# Patient Record
Sex: Female | Born: 1935 | Race: White | Hispanic: No | Smoking: Former smoker
Health system: Southern US, Community
[De-identification: ages and names within clinical notes are randomized; demographics above are authoritative.]

## PROBLEM LIST (undated history)

## (undated) DIAGNOSIS — F329 Major depressive disorder, single episode, unspecified: Secondary | ICD-10-CM

## (undated) DIAGNOSIS — F32A Depression, unspecified: Secondary | ICD-10-CM

## (undated) DIAGNOSIS — Z950 Presence of cardiac pacemaker: Secondary | ICD-10-CM

## (undated) DIAGNOSIS — E785 Hyperlipidemia, unspecified: Secondary | ICD-10-CM

## (undated) DIAGNOSIS — F419 Anxiety disorder, unspecified: Secondary | ICD-10-CM

## (undated) DIAGNOSIS — I4819 Other persistent atrial fibrillation: Secondary | ICD-10-CM

## (undated) DIAGNOSIS — R0789 Other chest pain: Secondary | ICD-10-CM

## (undated) DIAGNOSIS — I10 Essential (primary) hypertension: Secondary | ICD-10-CM

## (undated) DIAGNOSIS — I495 Sick sinus syndrome: Secondary | ICD-10-CM

## (undated) DIAGNOSIS — H3530 Unspecified macular degeneration: Secondary | ICD-10-CM

## (undated) DIAGNOSIS — M1990 Unspecified osteoarthritis, unspecified site: Secondary | ICD-10-CM

## (undated) DIAGNOSIS — I2510 Atherosclerotic heart disease of native coronary artery without angina pectoris: Secondary | ICD-10-CM

## (undated) DIAGNOSIS — H409 Unspecified glaucoma: Secondary | ICD-10-CM

## (undated) DIAGNOSIS — K219 Gastro-esophageal reflux disease without esophagitis: Secondary | ICD-10-CM

## (undated) DIAGNOSIS — H9193 Unspecified hearing loss, bilateral: Secondary | ICD-10-CM

## (undated) DIAGNOSIS — R002 Palpitations: Secondary | ICD-10-CM

## (undated) HISTORY — PX: OTHER SURGICAL HISTORY: SHX169

## (undated) HISTORY — DX: Palpitations: R00.2

## (undated) HISTORY — PX: CHOLECYSTECTOMY: SHX55

## (undated) HISTORY — DX: Other chest pain: R07.89

---

## 2004-02-05 DIAGNOSIS — M159 Polyosteoarthritis, unspecified: Secondary | ICD-10-CM | POA: Insufficient documentation

## 2005-10-03 DIAGNOSIS — F329 Major depressive disorder, single episode, unspecified: Secondary | ICD-10-CM | POA: Insufficient documentation

## 2005-10-03 DIAGNOSIS — H68109 Unspecified obstruction of Eustachian tube, unspecified ear: Secondary | ICD-10-CM | POA: Insufficient documentation

## 2012-04-03 ENCOUNTER — Emergency Department: Payer: Self-pay

## 2012-04-03 LAB — URINALYSIS, COMPLETE
Bilirubin,UR: NEGATIVE
Glucose,UR: NEGATIVE mg/dL (ref 0–75)
Leukocyte Esterase: NEGATIVE
RBC,UR: <1 /HPF (ref 0–5)
Squamous Epithelial: NONE SEEN
WBC UR: <1 /HPF (ref 0–5)

## 2012-04-05 LAB — URINE CULTURE

## 2012-04-24 HISTORY — PX: PACEMAKER IMPLANT: EP1218

## 2013-03-31 DIAGNOSIS — H43813 Vitreous degeneration, bilateral: Secondary | ICD-10-CM | POA: Insufficient documentation

## 2013-03-31 DIAGNOSIS — Z961 Presence of intraocular lens: Secondary | ICD-10-CM | POA: Insufficient documentation

## 2013-03-31 DIAGNOSIS — H353190 Nonexudative age-related macular degeneration, unspecified eye, stage unspecified: Secondary | ICD-10-CM | POA: Insufficient documentation

## 2014-11-06 DIAGNOSIS — H9193 Unspecified hearing loss, bilateral: Secondary | ICD-10-CM | POA: Insufficient documentation

## 2014-11-06 DIAGNOSIS — Z8639 Personal history of other endocrine, nutritional and metabolic disease: Secondary | ICD-10-CM | POA: Insufficient documentation

## 2014-12-06 DIAGNOSIS — I214 Non-ST elevation (NSTEMI) myocardial infarction: Secondary | ICD-10-CM | POA: Insufficient documentation

## 2016-02-28 DIAGNOSIS — Y9389 Activity, other specified: Secondary | ICD-10-CM | POA: Insufficient documentation

## 2016-03-24 DIAGNOSIS — Z7901 Long term (current) use of anticoagulants: Secondary | ICD-10-CM | POA: Insufficient documentation

## 2016-03-24 DIAGNOSIS — I4430 Unspecified atrioventricular block: Secondary | ICD-10-CM | POA: Insufficient documentation

## 2016-05-16 ENCOUNTER — Ambulatory Visit (INDEPENDENT_AMBULATORY_CARE_PROVIDER_SITE_OTHER): Payer: Medicare Other | Admitting: Podiatry

## 2016-05-16 DIAGNOSIS — B351 Tinea unguium: Secondary | ICD-10-CM

## 2016-05-16 DIAGNOSIS — L608 Other nail disorders: Secondary | ICD-10-CM

## 2016-05-16 DIAGNOSIS — M2042 Other hammer toe(s) (acquired), left foot: Secondary | ICD-10-CM

## 2016-05-16 DIAGNOSIS — M79609 Pain in unspecified limb: Secondary | ICD-10-CM

## 2016-05-16 DIAGNOSIS — L603 Nail dystrophy: Secondary | ICD-10-CM

## 2016-05-16 DIAGNOSIS — M2041 Other hammer toe(s) (acquired), right foot: Secondary | ICD-10-CM

## 2016-05-25 NOTE — Progress Notes (Signed)
   SUBJECTIVE Patient  presents to office today complaining of elongated, thickened nails. Pain while ambulating in shoes. Patient is unable to trim their own nails.   OBJECTIVE General Patient is awake, alert, and oriented x 3 and in no acute distress. Derm Skin is dry and supple bilateral. Negative open lesions or macerations. Remaining integument unremarkable. Nails are tender, long, thickened and dystrophic with subungual debris, consistent with onychomycosis, 1-5 bilateral. No signs of infection noted. Vasc  DP and PT pedal pulses palpable bilaterally. Temperature gradient within normal limits.  Neuro Epicritic and protective threshold sensation diminished bilaterally.  Musculoskeletal Exam No symptomatic pedal deformities noted bilateral. Muscular strength within normal limits.  ASSESSMENT 1. Onychodystrophic nails 1-5 bilateral with hyperkeratosis of nails.  2. Onychomycosis of nail due to dermatophyte bilateral 3. Pain in foot bilateral 4. Hammertoes bilateral  PLAN OF CARE 1. Patient evaluated today.  2. Instructed to maintain good pedal hygiene and foot care.  3. Mechanical debridement of nails 1-5 bilaterally performed using a nail nipper. Filed with dremel without incident.  4. Silicone toe caps were dispensed today  5. Return to clinic in 3 mos.    Edrick Kins, DPM [facility]  Dr. Edrick Kins, [address]                                        [place], [place] [address]                Office [phone]  Fax [phone]

## 2016-08-15 ENCOUNTER — Emergency Department: Payer: Medicare Other

## 2016-08-15 ENCOUNTER — Observation Stay
Admission: EM | Admit: 2016-08-15 | Discharge: 2016-08-16 | Disposition: A | Discharge status: Home / Self Care | Payer: Medicare Other | Attending: Internal Medicine | Admitting: Internal Medicine

## 2016-08-15 ENCOUNTER — Ambulatory Visit: Payer: Medicare Other | Admitting: Podiatry

## 2016-08-15 DIAGNOSIS — K219 Gastro-esophageal reflux disease without esophagitis: Secondary | ICD-10-CM | POA: Insufficient documentation

## 2016-08-15 DIAGNOSIS — I499 Cardiac arrhythmia, unspecified: Secondary | ICD-10-CM | POA: Diagnosis not present

## 2016-08-15 DIAGNOSIS — R002 Palpitations: Secondary | ICD-10-CM | POA: Insufficient documentation

## 2016-08-15 DIAGNOSIS — J449 Chronic obstructive pulmonary disease, unspecified: Secondary | ICD-10-CM | POA: Diagnosis not present

## 2016-08-15 DIAGNOSIS — I10 Essential (primary) hypertension: Secondary | ICD-10-CM

## 2016-08-15 DIAGNOSIS — I481 Persistent atrial fibrillation: Secondary | ICD-10-CM | POA: Insufficient documentation

## 2016-08-15 DIAGNOSIS — E785 Hyperlipidemia, unspecified: Secondary | ICD-10-CM | POA: Insufficient documentation

## 2016-08-15 DIAGNOSIS — H3530 Unspecified macular degeneration: Secondary | ICD-10-CM | POA: Diagnosis not present

## 2016-08-15 DIAGNOSIS — R42 Dizziness and giddiness: Secondary | ICD-10-CM | POA: Diagnosis not present

## 2016-08-15 DIAGNOSIS — Z950 Presence of cardiac pacemaker: Secondary | ICD-10-CM | POA: Diagnosis not present

## 2016-08-15 DIAGNOSIS — Z7901 Long term (current) use of anticoagulants: Secondary | ICD-10-CM | POA: Insufficient documentation

## 2016-08-15 DIAGNOSIS — I495 Sick sinus syndrome: Secondary | ICD-10-CM | POA: Insufficient documentation

## 2016-08-15 DIAGNOSIS — R079 Chest pain, unspecified: Principal | ICD-10-CM | POA: Insufficient documentation

## 2016-08-15 DIAGNOSIS — R531 Weakness: Secondary | ICD-10-CM | POA: Diagnosis not present

## 2016-08-15 DIAGNOSIS — R55 Syncope and collapse: Secondary | ICD-10-CM | POA: Diagnosis not present

## 2016-08-15 DIAGNOSIS — Z79899 Other long term (current) drug therapy: Secondary | ICD-10-CM | POA: Diagnosis not present

## 2016-08-15 DIAGNOSIS — I480 Paroxysmal atrial fibrillation: Secondary | ICD-10-CM | POA: Diagnosis present

## 2016-08-15 DIAGNOSIS — Z8249 Family history of ischemic heart disease and other diseases of the circulatory system: Secondary | ICD-10-CM | POA: Diagnosis not present

## 2016-08-15 DIAGNOSIS — Z888 Allergy status to other drugs, medicaments and biological substances status: Secondary | ICD-10-CM | POA: Diagnosis not present

## 2016-08-15 DIAGNOSIS — I2510 Atherosclerotic heart disease of native coronary artery without angina pectoris: Secondary | ICD-10-CM | POA: Diagnosis not present

## 2016-08-15 DIAGNOSIS — R5383 Other fatigue: Secondary | ICD-10-CM

## 2016-08-15 DIAGNOSIS — I77810 Thoracic aortic ectasia: Secondary | ICD-10-CM

## 2016-08-15 DIAGNOSIS — H409 Unspecified glaucoma: Secondary | ICD-10-CM | POA: Diagnosis not present

## 2016-08-15 DIAGNOSIS — I119 Hypertensive heart disease without heart failure: Secondary | ICD-10-CM | POA: Diagnosis not present

## 2016-08-15 HISTORY — DX: Hyperlipidemia, unspecified: E78.5

## 2016-08-15 HISTORY — DX: Other persistent atrial fibrillation: I48.19

## 2016-08-15 HISTORY — DX: Unspecified hearing loss, bilateral: H91.93

## 2016-08-15 HISTORY — DX: Presence of cardiac pacemaker: Z95.0

## 2016-08-15 HISTORY — DX: Unspecified glaucoma: H40.9

## 2016-08-15 HISTORY — DX: Unspecified macular degeneration: H35.30

## 2016-08-15 HISTORY — DX: Unspecified osteoarthritis, unspecified site: M19.90

## 2016-08-15 HISTORY — DX: Sick sinus syndrome: I49.5

## 2016-08-15 HISTORY — DX: Gastro-esophageal reflux disease without esophagitis: K21.9

## 2016-08-15 HISTORY — DX: Anxiety disorder, unspecified: F41.9

## 2016-08-15 HISTORY — DX: Major depressive disorder, single episode, unspecified: F32.9

## 2016-08-15 HISTORY — DX: Atherosclerotic heart disease of native coronary artery without angina pectoris: I25.10

## 2016-08-15 HISTORY — DX: Depression, unspecified: F32.A

## 2016-08-15 HISTORY — DX: Essential (primary) hypertension: I10

## 2016-08-15 LAB — URINALYSIS, ROUTINE W REFLEX MICROSCOPIC
BACTERIA UA: NONE SEEN
Bilirubin Urine: NEGATIVE
Glucose, UA: NEGATIVE mg/dL
Ketones, ur: NEGATIVE mg/dL
Leukocytes, UA: NEGATIVE
Nitrite: NEGATIVE
PROTEIN: NEGATIVE mg/dL
SPECIFIC GRAVITY, URINE: 1.008 (ref 1.005–1.030)
Squamous Epithelial / LPF: NONE SEEN
pH: 5 (ref 5.0–8.0)

## 2016-08-15 LAB — DIFFERENTIAL
BASOS PCT: 1 %
Basophils Absolute: 0.1 10*3/uL (ref 0–0.1)
Eosinophils Absolute: 0.2 10*3/uL (ref 0–0.7)
Eosinophils Relative: 2 %
LYMPHS ABS: 1.7 10*3/uL (ref 1.0–3.6)
Lymphocytes Relative: 17 %
MONOS PCT: 9 %
Monocytes Absolute: 0.9 10*3/uL (ref 0.2–0.9)
NEUTROS ABS: 7.2 10*3/uL — AB (ref 1.4–6.5)
Neutrophils Relative %: 71 %

## 2016-08-15 LAB — BASIC METABOLIC PANEL
Anion gap: 7 (ref 5–15)
BUN: 14 mg/dL (ref 6–20)
CHLORIDE: 106 mmol/L (ref 101–111)
CO2: 26 mmol/L (ref 22–32)
CREATININE: 0.67 mg/dL (ref 0.44–1.00)
Calcium: 9.6 mg/dL (ref 8.9–10.3)
Glucose, Bld: 93 mg/dL (ref 65–99)
POTASSIUM: 3.9 mmol/L (ref 3.5–5.1)
SODIUM: 139 mmol/L (ref 135–145)

## 2016-08-15 LAB — PROTIME-INR
INR: 1.16
PROTHROMBIN TIME: 14.9 s (ref 11.4–15.2)

## 2016-08-15 LAB — GLUCOSE, CAPILLARY: GLUCOSE-CAPILLARY: 89 mg/dL (ref 65–99)

## 2016-08-15 LAB — CBC
HCT: 40.2 % (ref 35.0–47.0)
HEMATOCRIT: 40.9 % (ref 35.0–47.0)
Hemoglobin: 14.1 g/dL (ref 12.0–16.0)
Hemoglobin: 13.5 g/dL (ref 12.0–16.0)
MCH: 30.1 pg (ref 26.0–34.0)
MCH: 29.5 pg (ref 26.0–34.0)
MCHC: 34.6 g/dL (ref 32.0–36.0)
MCHC: 33.7 g/dL (ref 32.0–36.0)
MCV: 87.1 fL (ref 80.0–100.0)
MCV: 87.4 fL (ref 80.0–100.0)
PLATELETS: 330 10*3/uL (ref 150–440)
PLATELETS: 326 10*3/uL (ref 150–440)
RBC: 4.69 MIL/uL (ref 3.80–5.20)
RBC: 4.59 MIL/uL (ref 3.80–5.20)
RDW: 14.3 % (ref 11.5–14.5)
RDW: 14.2 % (ref 11.5–14.5)
WBC: 10.1 10*3/uL (ref 3.6–11.0)
WBC: 11.1 10*3/uL — AB (ref 3.6–11.0)

## 2016-08-15 LAB — TROPONIN I: Troponin I: <0.03 ng/mL (ref <0.03)

## 2016-08-15 LAB — CREATININE, SERUM
CREATININE: 0.69 mg/dL (ref 0.44–1.00)
GFR calc non Af Amer: >60 mL/min (ref >60)

## 2016-08-15 LAB — APTT: aPTT: 51 seconds — ABNORMAL HIGH (ref 24–36)

## 2016-08-15 LAB — ETHANOL

## 2016-08-15 MED ORDER — SODIUM CHLORIDE 0.9 % IV SOLN
INTRAVENOUS | Status: DC
  Administered 2016-08-15: 19:00:00 via INTRAVENOUS

## 2016-08-15 MED ORDER — CHOLESTYRAMINE 4 G PO PACK
4.0000 g | PACK | Freq: Three times a day (TID) | ORAL | Status: DC
  Filled 2016-08-15: qty 1

## 2016-08-15 MED ORDER — IBUPROFEN 400 MG PO TABS: 200.0000 mg | ORAL_TABLET | Freq: Two times a day (BID) | ORAL | Status: DC | PRN

## 2016-08-15 MED ORDER — HYDRALAZINE HCL 20 MG/ML IJ SOLN: 5.0000 mg | Freq: Four times a day (QID) | INTRAMUSCULAR | Status: DC | PRN

## 2016-08-15 MED ORDER — MORPHINE SULFATE (PF) 4 MG/ML IV SOLN
4.0000 mg | Freq: Once | INTRAVENOUS | Status: AC
  Administered 2016-08-15: 4 mg via INTRAVENOUS
  Filled 2016-08-15: qty 1

## 2016-08-15 MED ORDER — MORPHINE SULFATE (PF) 2 MG/ML IV SOLN: 2.0000 mg | INTRAVENOUS | Status: DC | PRN

## 2016-08-15 MED ORDER — ASPIRIN 81 MG PO CHEW
81.0000 mg | CHEWABLE_TABLET | Freq: Every day | ORAL | Status: DC
  Administered 2016-08-16: 81 mg via ORAL
  Filled 2016-08-15: qty 1

## 2016-08-15 MED ORDER — ACETAMINOPHEN 650 MG RE SUPP: 650.0000 mg | Freq: Four times a day (QID) | RECTAL | Status: DC | PRN

## 2016-08-15 MED ORDER — LISINOPRIL 20 MG PO TABS
40.0000 mg | ORAL_TABLET | Freq: Every day | ORAL | Status: DC
Start: 2016-08-16 — End: 2016-08-16
  Administered 2016-08-16: 40 mg via ORAL
  Filled 2016-08-15: qty 2

## 2016-08-15 MED ORDER — HYDROCODONE-ACETAMINOPHEN 5-325 MG PO TABS: 1.0000 | ORAL_TABLET | ORAL | Status: DC | PRN

## 2016-08-15 MED ORDER — CHOLESTYRAMINE 4 G PO PACK
4.0000 g | PACK | Freq: Three times a day (TID) | ORAL | Status: DC
  Administered 2016-08-16 (×2): 4 g via ORAL
  Filled 2016-08-15 (×4): qty 1

## 2016-08-15 MED ORDER — ENOXAPARIN SODIUM 40 MG/0.4ML ~~LOC~~ SOLN
40.0000 mg | SUBCUTANEOUS | Status: DC
  Administered 2016-08-15: 40 mg via SUBCUTANEOUS
  Filled 2016-08-15: qty 0.4

## 2016-08-15 MED ORDER — ACETAMINOPHEN 325 MG PO TABS: 650.0000 mg | ORAL_TABLET | Freq: Four times a day (QID) | ORAL | Status: DC | PRN

## 2016-08-15 MED ORDER — SOTALOL HCL 80 MG PO TABS
80.0000 mg | ORAL_TABLET | Freq: Two times a day (BID) | ORAL | Status: DC
  Administered 2016-08-15 – 2016-08-16 (×2): 80 mg via ORAL
  Filled 2016-08-15 (×3): qty 1

## 2016-08-15 MED ORDER — PANTOPRAZOLE SODIUM 40 MG PO TBEC
40.0000 mg | DELAYED_RELEASE_TABLET | Freq: Every day | ORAL | Status: DC
  Administered 2016-08-15 – 2016-08-16 (×2): 40 mg via ORAL
  Filled 2016-08-15 (×2): qty 1

## 2016-08-15 MED ORDER — HYDROCODONE-ACETAMINOPHEN 5-325 MG PO TABS: 1.0000 | ORAL_TABLET | Freq: Four times a day (QID) | ORAL | Status: DC | PRN

## 2016-08-15 MED ORDER — APIXABAN 5 MG PO TABS
5.0000 mg | ORAL_TABLET | Freq: Two times a day (BID) | ORAL | Status: DC
  Administered 2016-08-15 – 2016-08-16 (×2): 5 mg via ORAL
  Filled 2016-08-15 (×2): qty 1

## 2016-08-15 MED ORDER — ONDANSETRON HCL 4 MG/2ML IJ SOLN
4.0000 mg | Freq: Once | INTRAMUSCULAR | Status: AC
  Administered 2016-08-15: 4 mg via INTRAVENOUS
  Filled 2016-08-15: qty 2

## 2016-08-15 MED ORDER — ALBUTEROL SULFATE (2.5 MG/3ML) 0.083% IN NEBU: 2.5000 mg | INHALATION_SOLUTION | Freq: Four times a day (QID) | RESPIRATORY_TRACT | Status: DC | PRN

## 2016-08-15 NOTE — ED Notes (Signed)
ED Provider at bedside. 

## 2016-08-15 NOTE — ED Notes (Addendum)
Pt describes "this morning when I woke up it was like a white waterfall. I couldn't open my eyes and get awake"

## 2016-08-15 NOTE — ED Notes (Signed)
Pt transported to CT ?

## 2016-08-15 NOTE — Progress Notes (Signed)
Admitted from the ED at 1800 for observation of chest pain.  Denies pain at this time.  Reports that she has been instructed by Cardiologist not to take aspirin as it conflicts with a medication she is on.  Not sure of which medication.  Reports she takes chlorestyramin before each meal to prevent diarrhea.  Oriented to the room and unit.

## 2016-08-15 NOTE — ED Notes (Signed)
Interrogated medtronic pacemaker

## 2016-08-15 NOTE — ED Provider Notes (Addendum)
[facility] Emergency Department Provider Note ____________________________________________   I have reviewed the triage vital signs and the triage nursing note.  HISTORY  Chief Complaint Chest Pain   Historian Patient  HPI Chelsey Williams is a 83 y.o. female treated for hypertension and has pacemaker, here for evaluation of fatigue and chest pain and palpitations.  Upon waking this AM she felt like she could not get up, could not open her eyes.  She describes a "white waterfall" but states this was not with her eyes open, it was not a visual disturbance.  Son states she has macular degeneration.  She called him on the phone while he was at work and states same complaints she tells me - fatigue upon waking without focal area of weakness and central chest discomfort.  In triage bay patient was being evaluated for complaint of chest pain and then patient stated she was having vision changes and left side weakness and was made Code Stroke and sent to CT scan by nurse initiator.  Patient tells me she did not have any vision changes and the weakness is all over - not just left arm, but all four extremities, a feeling of fatigue all over, for weeks. She thinks this started after cardiologist changed pacemaker set point, increasing by 10 beats.  She's been unhappy with this decision and thinks it's responsible for the chest discomfort.    Reports she can't take aspirin, unclear why, but is on eliquis.   Past Medical History:  Diagnosis Date  . A-fib ([place])   . Anxiety   . Bilateral hearing loss   . CAD (coronary artery disease)   . Depression   . GERD (gastroesophageal reflux disease)   . Glaucoma   . Hyperlipemia   . Hypertension   . OA (osteoarthritis)   . Pacemaker   . Pacemaker     There are no active problems to display for this patient.   History reviewed. No pertinent surgical history.  Prior to Admission medications   Medication Sig Start Date End  Date Taking? Authorizing Provider  albuterol (PROVENTIL HFA;VENTOLIN HFA) 108 (90 Base) MCG/ACT inhaler Inhale into the lungs. 02/21/16 02/20/17 Yes Historical Provider, MD  apixaban (ELIQUIS) 5 MG TABS tablet Take 5 mg by mouth 2 (two) times daily.  04/06/16  Yes Historical Provider, MD  cholestyramine (QUESTRAN) 4 g packet Take 4 g by mouth 3 (three) times daily.  04/04/16 04/04/17 Yes Historical Provider, MD  HYDROcodone-acetaminophen (NORCO/VICODIN) 5-325 MG tablet take 1 tablet by mouth every 6 hours if needed for pain for 5 days 04/06/16  Yes Historical Provider, MD  ibuprofen (ADVIL,MOTRIN) 200 MG tablet Take 200 mg by mouth 2 (two) times daily as needed.    Yes Historical Provider, MD  lisinopril (PRINIVIL,ZESTRIL) 20 MG tablet Take by mouth. 04/06/16  Yes Historical Provider, MD  lisinopril (PRINIVIL,ZESTRIL) 40 MG tablet Take by mouth. 04/06/16 04/06/17 Yes Historical Provider, MD  omeprazole (PRILOSEC) 20 MG capsule Take 20 mg by mouth daily as needed.    Yes Historical Provider, MD  sotalol (BETAPACE) 80 MG tablet Take 80 mg by mouth every 12 (twelve) hours. 03/24/16  Yes Historical Provider, MD    Allergies  Allergen Reactions  . Cephalexin Anaphylaxis    Shock / Unconsciousness  . Diltiazem Hcl Swelling  . Hydrochlorothiazide Other (See Comments)    dizziness  . Prednisone Shortness Of Breath    SOB  . Felodipine Nausea Only    Gastrointestinal problems, e.g., nausea, vomiting,  diarrheaShock / Unconsciousnessmuscle tenderness  . Statins Other (See Comments)  . Amlodipine Other (See Comments) and Palpitations  . Atenolol Palpitations    heart palpitations    Family History  Problem Relation Age of Onset  . Hypertension Mother     Social History Social History  Substance Use Topics  . Smoking status: Never Smoker  . Smokeless tobacco: Never Used  . Alcohol use No    Review of Systems  Constitutional: Negative for fever. Eyes: Negative for visual changes. ENT:  Negative for sore throat. Cardiovascular: Positive for chest pain. Respiratory: Negative for shortness of breath. Gastrointestinal: Negative for abdominal pain, vomiting and diarrhea. Genitourinary: Negative for dysuria. Musculoskeletal: Negative for back pain. Skin: Negative for rash. Neurological: Negative for headache. 10 point Review of Systems otherwise negative ____________________________________________   PHYSICAL EXAM:  VITAL SIGNS: ED Triage Vitals  Enc Vitals Group     BP 08/15/16 1257 (!) 162/107     Pulse Rate 08/15/16 1257 72     Resp 08/15/16 1257 18     Temp 08/15/16 1257 98.2 F (36.8 C)     Temp Source 08/15/16 1257 Oral     SpO2 08/15/16 1257 99 %     Weight 08/15/16 1258 164 lb (74.4 kg)     Height 08/15/16 1258 5' 4"  (1.626 m)     Head Circumference --      Peak Flow --      Pain Score 08/15/16 1253 7     Pain Loc --      Pain Edu? --      Excl. in [place]? --      Constitutional: Alert and oriented. Well appearing and in no distress.  Rambling historian. HEENT   Head: Normocephalic and atraumatic.      Eyes: Conjunctivae are normal. PERRL. Normal extraocular movements.      Ears:         Nose: No congestion/rhinnorhea.   Mouth/Throat: Mucous membranes are moist.   Neck: No stridor. Cardiovascular/Chest: Normal rate, regular rhythm.  No murmurs, rubs, or gallops. Respiratory: Normal respiratory effort without tachypnea nor retractions. Breath sounds are clear and equal bilaterally. No wheezes/rales/rhonchi. Gastrointestinal: Soft. No distention, no guarding, no rebound. Nontender.  Genitourinary/rectal:Deferred Musculoskeletal: Nontender with normal range of motion in all extremities. No joint effusions.  No lower extremity tenderness.  No edema. Neurologic:  No facial droop. Normal speech and language. Able to hold arms and legs up without drift.  No sensory deficit.  Skin:  Skin is warm, dry and intact. No rash noted. Psychiatric: Seems  pretty anxious.  Speech and behavior are normal. Patient exhibits appropriate insight and judgment.   ____________________________________________  LABS (pertinent positives/negatives)  Labs Reviewed  APTT - Abnormal; Notable for the following:       Result Value   aPTT 51 (*)    All other components within normal limits  URINALYSIS, ROUTINE W REFLEX MICROSCOPIC - Abnormal; Notable for the following:    Color, Urine STRAW (*)    APPearance CLEAR (*)    Hgb urine dipstick MODERATE (*)    All other components within normal limits  DIFFERENTIAL - Abnormal; Notable for the following:    Neutro Abs 7.2 (*)    All other components within normal limits  BASIC METABOLIC PANEL  CBC  TROPONIN I  ETHANOL  PROTIME-INR  GLUCOSE, CAPILLARY  URINALYSIS, COMPLETE (UACMP) WITH MICROSCOPIC    ____________________________________________    EKG I, Lisa Roca, MD, the attending physician have personally  viewed and interpreted all ECGs.  74 bpm. Atrially paced rhythm. Narrow QRS. Normal axis. Normal ST and T-wave ____________________________________________  RADIOLOGY All Xrays were viewed by me. Imaging interpreted by Radiologist.  CXR:  IMPRESSION: 1. No acute cardiopulmonary disease.  2. Cardiac pacer with lead tips in right atrium and right ventricle. Heart size normal. __________________________________________  PROCEDURES  Procedure(s) performed: None  Critical Care performed: None  ____________________________________________   ED COURSE / ASSESSMENT AND PLAN  Pertinent labs & imaging results that were available during my care of the patient were reviewed by me and considered in my medical decision making (see chart for details).   Ms. Cotta is here for evaluation of chest discomfort. Her EKG is atrially paced with no ischemic findings. She does not have any other associated symptoms with the chest discomfort. She does not want to take aspirin and states that she  cannot tolerated. She is on Eloquis.  She is quite a rambling historian, and apparently when she began to add the additional complaint of weakness in triage, she initially started out by discussing weakness of the left arm and that initiated triage code stroke.  Her head CT is reassuring.  On my exam she has no focal neurologic deficit but complains of diffuse generalized fatigue over several weeks. I can see how it would been difficult to tease this out in triage, and in fact it took me probably a good 10 minutes with her son at the bedside to figure out if she was trying to describe any new or one-sided weakness and in fact she states that she has not had any new or one-sided weakness.  No vision changes - the white veil waterfall was before her eyes were opened this morning and she states she did not have any vision problems in triage.  We discussed reassuring exam and evaluation. Patient states that she is just not sure why she nearly passed out this morning and describes that once she was able to finally wake up this morning she felt dizzy like she might pass out. We discussed observation hospitalization for near syncope and chest pain, she feels most comfortable with this plan.      CONSULTATIONS:  [employer] rep by phone -- no pacer issues.   Hospitalist for admission.   Patient / Family / Caregiver informed of clinical course, medical decision-making process, and agree with plan.   Addended:  Patient requested after request for hospital admission for d/c after repeat troponin.  In discussing with her, she had some ectopy on the monitor.  The rhythm strip was reviewed, underlying atrially paced rhythm with several beats of ventricular origin.  Unclear if ventricularly paced, but would be unlikely due to near 300 bpm, unsustained few 2-3 beats.  Will have pacer reinterrogated.  Patient agreeable for admission.  Dr. Benjie Karvonen to admit.  Dr. Clearnce Hasten transferred ED  care. ___________________________________________   FINAL CLINICAL IMPRESSION(S) / ED DIAGNOSES   Final diagnoses:  Nonspecific chest pain  Near syncope  Fatigue, unspecified type  Cardiac arrhythmia, unspecified cardiac arrhythmia type              Note: This dictation was prepared with Dragon dictation. Any transcriptional errors that result from this process are unintentional    Lisa Roca, MD 08/15/16 [place], MD 08/15/16 1536

## 2016-08-15 NOTE — ED Triage Notes (Signed)
Pt arrived via POV for c/o chest pain for the last month and a hald

## 2016-08-15 NOTE — Progress Notes (Signed)
Chaplain did a follow up visit to patient. The patient requested a prayer and Chaplain prayed for her.   08/15/16 2000  Clinical Encounter Type  Visited With Patient  Visit Type Initial  Referral From Nurse  Consult/Referral To Chaplain  Spiritual Encounters  Spiritual Needs Prayer

## 2016-08-15 NOTE — ED Notes (Signed)
Called code stroke to [phone]

## 2016-08-15 NOTE — ED Notes (Signed)
Pt was in triage and began to c/o weakness on left side and feeling like a vail had been brought down over her face and eye with c/o facial numbness - charge nurse notified and code stroke called - pt taken to CT

## 2016-08-15 NOTE — H&P (Signed)
[facility] [facility] [place] NAME: Chelsey Williams    MR#:  [id number]  DATE OF BIRTH:  1927-05-13  DATE OF ADMISSION:  08/15/2016  PRIMARY CARE PHYSICIAN: Clarisse Gouge, MD   REQUESTING/REFERRING PHYSICIAN: Merian Capron MD  CHIEF COMPLAINT:   Chief Complaint  Patient presents with  . Chest Pain    HISTORY OF PRESENT ILLNESS: Chelsey Williams  is a 83 y.o. female with a known history of afib, anxiety, gerd, s/p pacemaker, hyperlipidemia, essential hypertention, who presents to er with c/o chest pain ongoing for past few weeks. Who presents to er with worsening chest pain today. Initially wanted to go go home and then change her mind. Patient denies any shortness of breath or palpitations or syncope. Patient reports that her blood pressure fluctuates and when it increases her chest pain occurs. But if she is given blood pressure is adjusted to much that her blood pressure drops.    PAST MEDICAL HISTORY:   Past Medical History:  Diagnosis Date  . A-fib ([place])   . Anxiety   . Bilateral hearing loss   . CAD (coronary artery disease)   . Depression   . GERD (gastroesophageal reflux disease)   . Glaucoma   . Hyperlipemia   . Hypertension   . OA (osteoarthritis)   . Pacemaker   . Pacemaker     PAST SURGICAL HISTORY: pacemaker,choleycestomy  SOCIAL HISTORY:  Social History  Substance Use Topics  . Smoking status: Never Smoker  . Smokeless tobacco: Never Used  . Alcohol use No    FAMILY HISTORY:  Family History  Problem Relation Age of Onset  . Hypertension Mother     DRUG ALLERGIES:  Allergies  Allergen Reactions  . Cephalexin Anaphylaxis    Shock / Unconsciousness  . Diltiazem Hcl Swelling  . Hydrochlorothiazide Other (See Comments)    dizziness  . Prednisone Shortness Of Breath    SOB  . Felodipine Nausea Only    Gastrointestinal problems, e.g., nausea, vomiting, diarrheaShock / Unconsciousnessmuscle tenderness  . Statins Other  (See Comments)  . Amlodipine Other (See Comments) and Palpitations  . Atenolol Palpitations    heart palpitations    REVIEW OF SYSTEMS:   CONSTITUTIONAL: No fever, fatigue or weakness.  EYES: No blurred or double vision.  EARS, NOSE, AND THROAT: No tinnitus or ear pain.  RESPIRATORY: No cough, shortness of breath, wheezing or hemoptysis.  CARDIOVASCULAR: +chest pain, orthopnea, edema.  GASTROINTESTINAL: No nausea, vomiting, diarrhea or abdominal pain.  GENITOURINARY: No dysuria, hematuria.  ENDOCRINE: No polyuria, nocturia,  HEMATOLOGY: No anemia, easy bruising or bleeding SKIN: No rash or lesion. MUSCULOSKELETAL: No joint pain or arthritis.   NEUROLOGIC: No tingling, numbness, weakness.  PSYCHIATRY: No anxiety or depression.   MEDICATIONS AT HOME:  Prior to Admission medications   Medication Sig Start Date End Date Taking? Authorizing Provider  albuterol (PROVENTIL HFA;VENTOLIN HFA) 108 (90 Base) MCG/ACT inhaler Inhale into the lungs. 02/21/16 02/20/17 Yes Historical Provider, MD  apixaban (ELIQUIS) 5 MG TABS tablet Take 5 mg by mouth 2 (two) times daily.  04/06/16  Yes Historical Provider, MD  cholestyramine (QUESTRAN) 4 g packet Take 4 g by mouth 3 (three) times daily.  04/04/16 04/04/17 Yes Historical Provider, MD  HYDROcodone-acetaminophen (NORCO/VICODIN) 5-325 MG tablet take 1 tablet by mouth every 6 hours if needed for pain for 5 days 04/06/16  Yes Historical Provider, MD  ibuprofen (ADVIL,MOTRIN) 200 MG tablet Take 200 mg by mouth 2 (two) times daily  as needed.    Yes Historical Provider, MD  lisinopril (PRINIVIL,ZESTRIL) 20 MG tablet Take by mouth. 04/06/16  Yes Historical Provider, MD  lisinopril (PRINIVIL,ZESTRIL) 40 MG tablet Take by mouth. 04/06/16 04/06/17 Yes Historical Provider, MD  omeprazole (PRILOSEC) 20 MG capsule Take 20 mg by mouth daily as needed.    Yes Historical Provider, MD  sotalol (BETAPACE) 80 MG tablet Take 80 mg by mouth every 12 (twelve) hours.  03/24/16  Yes Historical Provider, MD      PHYSICAL EXAMINATION:   VITAL SIGNS: Blood pressure (!) 162/90, pulse 69, temperature 98.2 F (36.8 C), resp. rate 15, height 5' 4"  (1.626 m), weight 164 lb (74.4 kg), SpO2 99 %.  GENERAL:  83 y.o.-year-old patient lying in the bed with no acute distress.  EYES: Pupils equal, round, reactive to light and accommodation. No scleral icterus. Extraocular muscles intact.  HEENT: Head atraumatic, normocephalic. Oropharynx and nasopharynx clear.  NECK:  Supple, no jugular venous distention. No thyroid enlargement, no tenderness.  LUNGS: Normal breath sounds bilaterally, no wheezing, rales,rhonchi or crepitation. No use of accessory muscles of respiration.  CARDIOVASCULAR: S1, S2 normal. No murmurs, rubs, or gallops.  ABDOMEN: Soft, nontender, nondistended. Bowel sounds present. No organomegaly or mass.  EXTREMITIES: No pedal edema, cyanosis, or clubbing.  NEUROLOGIC: Cranial nerves II through XII are intact. Muscle strength 5/5 in all extremities. Sensation intact. Gait not checked.  PSYCHIATRIC: The patient is alert and oriented x 3.  SKIN: No obvious rash, lesion, or ulcer.   LABORATORY PANEL:   CBC  Recent Labs Lab 08/15/16 1315  WBC 10.1  HGB 14.1  HCT 40.9  PLT 330  MCV 87.1  MCH 30.1  MCHC 34.6  RDW 14.3  LYMPHSABS 1.7  MONOABS 0.9  EOSABS 0.2  BASOSABS 0.1   ------------------------------------------------------------------------------------------------------------------  Chemistries   Recent Labs Lab 08/15/16 1315  NA 139  K 3.9  CL 106  CO2 26  GLUCOSE 93  BUN 14  CREATININE 0.67  CALCIUM 9.6   ------------------------------------------------------------------------------------------------------------------ estimated creatinine clearance is 49 mL/min (by C-G formula based on SCr of 0.67 mg/dL). ------------------------------------------------------------------------------------------------------------------ No  results for input(s): TSH, T4TOTAL, T3FREE, THYROIDAB in the last 72 hours.  Invalid input(s): FREET3   Coagulation profile  Recent Labs Lab 08/15/16 1315  INR 1.16   ------------------------------------------------------------------------------------------------------------------- No results for input(s): DDIMER in the last 72 hours. -------------------------------------------------------------------------------------------------------------------  Cardiac Enzymes  Recent Labs Lab 08/15/16 1315  TROPONINI <0.03   ------------------------------------------------------------------------------------------------------------------ Invalid input(s): POCBNP  ---------------------------------------------------------------------------------------------------------------  Urinalysis    Component Value Date/Time   COLORURINE STRAW (A) 08/15/2016 1315   APPEARANCEUR CLEAR (A) 08/15/2016 1315   APPEARANCEUR Clear 04/03/2012 1431   LABSPEC 1.008 08/15/2016 1315   LABSPEC 1.005 04/03/2012 1431   PHURINE 5.0 08/15/2016 1315   GLUCOSEU NEGATIVE 08/15/2016 1315   GLUCOSEU Negative 04/03/2012 1431   HGBUR MODERATE (A) 08/15/2016 1315   BILIRUBINUR NEGATIVE 08/15/2016 1315   BILIRUBINUR Negative 04/03/2012 1431   KETONESUR NEGATIVE 08/15/2016 1315   PROTEINUR NEGATIVE 08/15/2016 1315   NITRITE NEGATIVE 08/15/2016 1315   LEUKOCYTESUR NEGATIVE 08/15/2016 1315   LEUKOCYTESUR Negative 04/03/2012 1431     RADIOLOGY: Dg Chest 2 View  Result Date: 08/15/2016 CLINICAL DATA:  Chest pain. EXAM: CHEST  2 VIEW COMPARISON:  No recent . FINDINGS: Cardiac pacer with lead tips in right atrium and right ventricle. Cardiomegaly with normal pulmonary vascularity. No focal infiltrate. No pleural effusion or pneumothorax. Is in the spine. IMPRESSION: 1. No acute cardiopulmonary disease. 2. Cardiac pacer  with lead tips in right atrium and right ventricle. Heart size normal. Electronically Signed   By:  Marcello Moores  Register   On: 08/15/2016 14:40   Ct Head Wo Contrast  Addendum Date: 08/15/2016   ADDENDUM REPORT: 08/15/2016 13:19 ADDENDUM: Critical Value/emergent results were called by telephone at the time of interpretation on 08/15/2016 at 1:18 pm to Dr. Alfred Levins, who verbally acknowledged these results. Electronically Signed   By: Rolm Baptise M.D.   On: 08/15/2016 13:19   Result Date: 08/15/2016 CLINICAL DATA:  Headache, weakness EXAM: CT HEAD WITHOUT CONTRAST TECHNIQUE: Contiguous axial images were obtained from the base of the skull through the vertex without intravenous contrast. COMPARISON:  None. FINDINGS: Brain: There is atrophy and chronic small vessel disease changes. No acute intracranial abnormality. Specifically, no hemorrhage, hydrocephalus, mass lesion, acute infarction, or significant intracranial injury. Vascular: No hyperdense vessel or unexpected calcification. Skull: No acute calvarial abnormality. Sinuses/Orbits: Visualized paranasal sinuses and mastoids clear. Orbital soft tissues unremarkable. Other: None IMPRESSION: No acute intracranial abnormality. Atrophy, chronic microvascular disease. Electronically Signed: By: Rolm Baptise M.D. On: 08/15/2016 13:13    EKG: Orders placed or performed during the hospital encounter of 08/15/16  . ED EKG within 10 minutes  . ED EKG within 10 minutes  . EKG 12-Lead  . EKG 12-Lead  . ED EKG  . ED EKG    IMPRESSION AND PLAN: She is 83 year old presenting with chest pain. 1. Chest pain We'll check him cardiac enzymes Patient not interested in having a stress test I will have the cardiologist come evaluate   2. Accelerated hypertension Continue therapy with lisinopril I will add low-dose hydralazine when necessary  3. Atrial fibrillation continue eliquis and sotalol  4. gerd continue omerperozle  5. COPD continue inhaler taking at home   All the records are reviewed and case discussed with ED provider. Management plans  discussed with the patient, family and they are in agreement.  CODE STATUS: Code Status History    This patient does not have a recorded code status. Please follow your organizational policy for patients in this situation.       TOTAL TIME TAKING CARE OF THIS PATIENT:55 minutes.    Dustin Flock M.D on 08/15/2016 at 4:25 PM  Between 7am to 6pm - Pager - [phone]  After 6pm go to [email or web address] - password [email or web address] [facility]  [facility]  Office  [phone]  CC: Primary care physician; Clarisse Gouge, MD

## 2016-08-15 NOTE — ED Notes (Signed)
CODE STROKE CALLED TO [id number] 

## 2016-08-16 ENCOUNTER — Encounter: Payer: Self-pay | Admitting: Physician Assistant

## 2016-08-16 ENCOUNTER — Observation Stay (HOSPITAL_BASED_OUTPATIENT_CLINIC_OR_DEPARTMENT_OTHER): Payer: Medicare Other

## 2016-08-16 ENCOUNTER — Observation Stay: Admit: 2016-08-16 | Payer: Medicare Other

## 2016-08-16 DIAGNOSIS — H3530 Unspecified macular degeneration: Secondary | ICD-10-CM

## 2016-08-16 DIAGNOSIS — R55 Syncope and collapse: Secondary | ICD-10-CM

## 2016-08-16 DIAGNOSIS — I499 Cardiac arrhythmia, unspecified: Secondary | ICD-10-CM

## 2016-08-16 DIAGNOSIS — I10 Essential (primary) hypertension: Secondary | ICD-10-CM | POA: Diagnosis not present

## 2016-08-16 DIAGNOSIS — I480 Paroxysmal atrial fibrillation: Secondary | ICD-10-CM | POA: Diagnosis present

## 2016-08-16 DIAGNOSIS — R002 Palpitations: Secondary | ICD-10-CM | POA: Diagnosis present

## 2016-08-16 DIAGNOSIS — I77810 Thoracic aortic ectasia: Secondary | ICD-10-CM

## 2016-08-16 DIAGNOSIS — R5383 Other fatigue: Secondary | ICD-10-CM

## 2016-08-16 DIAGNOSIS — I2510 Atherosclerotic heart disease of native coronary artery without angina pectoris: Secondary | ICD-10-CM | POA: Diagnosis present

## 2016-08-16 DIAGNOSIS — I481 Persistent atrial fibrillation: Secondary | ICD-10-CM

## 2016-08-16 DIAGNOSIS — Z950 Presence of cardiac pacemaker: Secondary | ICD-10-CM | POA: Diagnosis present

## 2016-08-16 DIAGNOSIS — R079 Chest pain, unspecified: Secondary | ICD-10-CM

## 2016-08-16 DIAGNOSIS — E785 Hyperlipidemia, unspecified: Secondary | ICD-10-CM

## 2016-08-16 HISTORY — DX: Palpitations: R00.2

## 2016-08-16 LAB — NM MYOCAR MULTI W/SPECT W/WALL MOTION / EF
CHL CUP NUCLEAR SRS: 2
CHL CUP STRESS STAGE 1 HR: 72 {beats}/min
CHL CUP STRESS STAGE 2 GRADE: 0 %
CHL CUP STRESS STAGE 2 SPEED: 0 mph
CHL CUP STRESS STAGE 3 DBP: 64 mmHg
CHL CUP STRESS STAGE 3 SBP: 145 mmHg
CSEPHR: 58 %
CSEPPMHR: 54 %
Estimated workload: 1 METS
LV sys vol: 18 mL
LVDIAVOL: 49 mL (ref 46–106)
Peak HR: 72 {beats}/min
Rest HR: 73 {beats}/min
SDS: 2
SSS: 3
Stage 2 HR: 72 {beats}/min
Stage 3 Grade: 0 %
Stage 3 HR: 78 {beats}/min
Stage 3 Speed: 0 mph
TID: 1.08

## 2016-08-16 LAB — CBC
HCT: 40.4 % (ref 35.0–47.0)
Hemoglobin: 13.5 g/dL (ref 12.0–16.0)
MCH: 29.4 pg (ref 26.0–34.0)
MCHC: 33.4 g/dL (ref 32.0–36.0)
MCV: 87.9 fL (ref 80.0–100.0)
PLATELETS: 300 10*3/uL (ref 150–440)
RBC: 4.59 MIL/uL (ref 3.80–5.20)
RDW: 14.5 % (ref 11.5–14.5)
WBC: 7.5 10*3/uL (ref 3.6–11.0)

## 2016-08-16 LAB — BASIC METABOLIC PANEL
ANION GAP: 3 — AB (ref 5–15)
BUN: 20 mg/dL (ref 6–20)
CALCIUM: 8.7 mg/dL — AB (ref 8.9–10.3)
CO2: 26 mmol/L (ref 22–32)
CREATININE: 0.64 mg/dL (ref 0.44–1.00)
Chloride: 106 mmol/L (ref 101–111)
GLUCOSE: 92 mg/dL (ref 65–99)
Potassium: 3.7 mmol/L (ref 3.5–5.1)
Sodium: 135 mmol/L (ref 135–145)

## 2016-08-16 LAB — TROPONIN I: Troponin I: <0.03 ng/mL (ref <0.03)

## 2016-08-16 LAB — MAGNESIUM: MAGNESIUM: 1.8 mg/dL (ref 1.7–2.4)

## 2016-08-16 MED ORDER — HYDRALAZINE HCL 10 MG PO TABS: 10.0000 mg | ORAL_TABLET | Freq: Three times a day (TID) | ORAL | 0 refills | Status: DC

## 2016-08-16 MED ORDER — TECHNETIUM TC 99M TETROFOSMIN IV KIT
31.7700 | PACK | Freq: Once | INTRAVENOUS | Status: AC | PRN
  Administered 2016-08-16: 31.77 via INTRAVENOUS

## 2016-08-16 MED ORDER — TECHNETIUM TC 99M TETROFOSMIN IV KIT
13.2980 | PACK | Freq: Once | INTRAVENOUS | Status: AC | PRN
  Administered 2016-08-16: 13.298 via INTRAVENOUS

## 2016-08-16 MED ORDER — REGADENOSON 0.4 MG/5ML IV SOLN
0.4000 mg | Freq: Once | INTRAVENOUS | Status: AC
  Administered 2016-08-16: 0.4 mg via INTRAVENOUS

## 2016-08-16 NOTE — Progress Notes (Signed)
Patient has no acute event overnight. She remained hemodynamically stable with VS WDL for patient. She stayed on the recliner overnight and denied pain or being in ant discomfort.

## 2016-08-16 NOTE — Progress Notes (Signed)
Patient discharged per MD order and hospital protocol. Patient and son verbalized understanding of prescriptions, discharge instructions and follow up appointments. Patient given prescriptions at discharge. Patient escorted off the unit via staff.

## 2016-08-16 NOTE — Progress Notes (Signed)
Dr. Rockey Situ called and said patient's stress test was normal. Patient doesn't have to stay in the hospital for echo since he was able to see a prior echo results. Notified Dr. Anselm Jungling.

## 2016-08-16 NOTE — Progress Notes (Signed)
Patient underwent Lexiscan Myoview this afternoon. After her test she complained of substernal chest pain. She was reevaluated with complete reproducibility of chest pain to palpation on exam. Chest pain possibly musculoskeletal in etiology. Await stress test results.

## 2016-08-16 NOTE — Care Management Obs Status (Signed)
[facility] NOTIFICATION   Patient Details  Name: Chelsey Williams MRN: [id number] Date of Birth: 1928-03-17   Medicare Observation Status Notification Given:  No < 24 hours   Katrina Stack, RN 08/16/2016, 6:01 PM

## 2016-08-16 NOTE — Consult Note (Signed)
Cardiology Consultation Note  Patient ID: Chelsey Williams, MRN: [id number], DOB/AGE: 81-02-1928 81 y.o. Admit date: 08/15/2016   Date of Consult: 08/16/2016 Primary Physician: Clarisse Gouge, MD Primary Cardiologist: Dr. Omelia Blackwater, MD ([facility]) Requesting Physician: Dr. Posey Pronto, MD  Chief Complaint: Exertional chest pain/palpitations/dizziness Reason for Consult: Same  HPI: Chelsey Williams is a 83 y.o. female who is being seen today for the evaluation of chest pain, dizziness, and palpitations at the request of Dr. Posey Pronto, MD. Patient has a h/o nonobstructive CAD by [facility] 12/07/2014 detailed below, persistent Afib s/p successful TEE/DCCV 02/2016 on sotalol and Eliquis, sinus node dysfunction s/p MDT PPM in [place], [place] implanted 08/7320, diastolic dysfunction, essential hypertension, HLD, anxiety/depression, OA, macular degeneration, and GERD who presented to [facility] with exertional chest pain since mid March 2018 as well as exertional palpitations and dizziness.   Patient was previously followed in [place], [place] though recently moved to [place] given her health and advanced age to live with her son. She has since been followed by [facility] EP. In mid March 2018 device interrogation showed no significant Afib. Device LRL was increased to 70 and AAI-DDDR mode switching activated to favor intrinsic AV conduction when possible. The patient believes her symptoms of exertional chest pain, palpitations, and dizziness all started after this device change. She notes "every time" she ambulates she develops substernal chest pain with associated palpitations and dizziness. Upon resting, these symptoms resolve. No associated nausea, vomiting, diaphoresis, presyncope, or syncope. She also notes when she gets worried/anxious her BP increases which leads to similar symptoms. She prefers for her BP to be in the 025 systolic range, stating any lower than that she becomes fatigued. Patient contacted her [facility] EP MD on 4/24 with the above complaints who  advised patient to proceed to the ED.   Upon the patient's arrival to [facility] they were found to have BP 162/107-->199/93, HR 72 bpm, temp 98.2, oxygen saturation 99% on room air, weight 164 pounds. In triage, she complained of weakness on the left side and felt like a vail had been brought down over her face with associated facial numbness. Code stroke was called. Initial head CT was read as non-acute at 13:13. This was followed by an addendum at 13:18 of a critical value/emergent result to Dr. Alfred Levins. I spoke with radiologist on call this morning for [facility] who reviewed this study and stated it appears this addendum was placed just to document the results had been called to the ED MD and there is no critical finding.    EKG showed A-paced rhythm 74 bpm, CXR showed no acute cardiopulmonary disease. Labs showed troponin negative x 4, SCr 0.69, K+ 3.9-->3.7, initial CBC unremarkable, ethanol negative.  PPM was interrogated x 2 in the ED with results showing: interrogation at 14:38 0 atrial high rates, 0 ventricular high rates, AS-VS 4.2%, AP-VS 95.7%, AP-VP 0.1%, AS-VP <0.1%, 2,924 single PVCs, 24 PVC runs, 18 PAC runs. Re-interrogation in the ED at 15:38 given possible ventricular arrhythmia noted in the ED showed same data as above.   Blood pressure has improved this morning to 139/83. Currently without any symptoms, though states "as soon as I walk my chest will hurt."   Past Medical History:  Diagnosis Date  . Anxiety   . Bilateral hearing loss   . Coronary artery disease, non-occlusive    a. LHC 11/2014: R dom, LM nl, mLAD 30-40, dLAD minor irregs, LCx minor irregs, RCA minor irregs w/ associated ectasia  . Depression   . GERD (  gastroesophageal reflux disease)   . Glaucoma   . Hyperlipemia   . Hypertension   . Macular degeneration   . OA (osteoarthritis)   . Pacemaker    a. Medtronic Adapta ADDRL1 dual-chamber pacemaker (serial number [id number]) with Medtronic 7673 atrial lead  (serial number [id number]) and Medtronic 5076 ventricular lead (serial number [id number]), all implanted 12/08/2014 by Dr. Jacklynn Barnacle at [facility] [phone].    Marland Kitchen Persistent atrial fibrillation ([place])    a. s/p DCCV 02/2016; b. sotalol and Eliquis; c. CHADS2VASc --> 4 (HTN, age x 2, female)  . Sinus node dysfunction (HCC)       Most Recent Cardiac Studies: LHC 12/07/2014: Coronary Angiography: 1. Right dominant circulation.  2. The left main gives rise to the LAD and left circumflex. The left main is free of stenosis.  3. The LAD is a large vessel that gives rise to one large bifurcating diagonal branch before continuing to wrap the apex. The LAD has a 30-40% stenosis in mid-vessel just after the origin of the diagonal branch with associated myocardial bridging. The LAD otherwise has mild luminal disease with significant distal tortuosity. 4. The left circumflex is a large vessel that gives rise to two large obtuse marginal branches. The circumflex and its branches have only mild luminal disease.  5. The RCA is a large caliber dominant vessel that distally gives rise to the PDA and a large bifurcating posterolateral branch. The RCA has mild luminal disease with associated ectasia in its mid and distal segments.   TEE 02/2016: CONCLUSIONS ------------------------------------------------------------------ 1) NO LAA THROMBUS OR MASS 2) MILD MR, MILD AR, MODERATE TR, TRIVIAL PR 3) GLOBALLY NORMAL SYSTOLIC FUNCTION   Surgical History:  Past Surgical History:  Procedure Laterality Date  . CHOLECYSTECTOMY    . knee replacmen    . PACEMAKER IMPLANT    . throid node       Home Meds: Prior to Admission medications   Medication Sig Start Date End Date Taking? Authorizing Provider  albuterol (PROVENTIL HFA;VENTOLIN HFA) 108 (90 Base) MCG/ACT inhaler Inhale into the lungs. 02/21/16 02/20/17 Yes Historical Provider, MD  apixaban (ELIQUIS) 5 MG TABS tablet Take 5 mg by mouth 2  (two) times daily.  04/06/16  Yes Historical Provider, MD  cholestyramine (QUESTRAN) 4 g packet Take 4 g by mouth 3 (three) times daily.  04/04/16 04/04/17 Yes Historical Provider, MD  HYDROcodone-acetaminophen (NORCO/VICODIN) 5-325 MG tablet take 1 tablet by mouth every 6 hours if needed for pain for 5 days 04/06/16  Yes Historical Provider, MD  ibuprofen (ADVIL,MOTRIN) 200 MG tablet Take 200 mg by mouth 2 (two) times daily as needed.    Yes Historical Provider, MD  lisinopril (PRINIVIL,ZESTRIL) 20 MG tablet Take by mouth. 04/06/16  Yes Historical Provider, MD  lisinopril (PRINIVIL,ZESTRIL) 40 MG tablet Take by mouth. 04/06/16 04/06/17 Yes Historical Provider, MD  omeprazole (PRILOSEC) 20 MG capsule Take 20 mg by mouth daily as needed.    Yes Historical Provider, MD  sotalol (BETAPACE) 80 MG tablet Take 80 mg by mouth every 12 (twelve) hours. 03/24/16  Yes Historical Provider, MD    Inpatient Medications:  . apixaban  5 mg Oral BID  . aspirin  81 mg Oral Daily  . cholestyramine  4 g Oral TID AC  . enoxaparin (LOVENOX) injection  40 mg Subcutaneous Q24H  . lisinopril  40 mg Oral Daily  . pantoprazole  40 mg Oral Daily  . sotalol  80 mg Oral Q12H   . sodium chloride 75  mL/hr at 08/15/16 1853    Allergies:  Allergies  Allergen Reactions  . Cephalexin Anaphylaxis    Shock / Unconsciousness  . Diltiazem Hcl Swelling  . Hydrochlorothiazide Other (See Comments)    dizziness  . Prednisone Shortness Of Breath    SOB  . Felodipine Nausea Only    Gastrointestinal problems, e.g., nausea, vomiting, diarrheaShock / Unconsciousnessmuscle tenderness  . Statins Other (See Comments)  . Amlodipine Other (See Comments) and Palpitations  . Atenolol Palpitations    heart palpitations    Social History   Social History  . Marital status: Widowed    Spouse name: N/A  . Number of children: N/A  . Years of education: N/A   Occupational History  . Not on file.   Social History Main Topics  .  Smoking status: Never Smoker  . Smokeless tobacco: Never Used  . Alcohol use No  . Drug use: No  . Sexual activity: Not on file   Other Topics Concern  . Not on file   Social History Narrative  . No narrative on file     Family History  Problem Relation Age of Onset  . Hypertension Mother      Review of Systems: Review of Systems  Constitutional: Positive for malaise/fatigue. Negative for chills, diaphoresis, fever and weight loss.  HENT: Negative for congestion.   Eyes: Negative for discharge and redness.  Respiratory: Negative for cough, hemoptysis, sputum production, shortness of breath and wheezing.   Cardiovascular: Positive for chest pain, palpitations and leg swelling. Negative for orthopnea, claudication and PND.  Gastrointestinal: Negative for abdominal pain, blood in stool, heartburn, melena, nausea and vomiting.  Genitourinary: Negative for hematuria.  Musculoskeletal: Negative for falls and myalgias.  Skin: Negative for rash.  Neurological: Positive for dizziness, tingling, sensory change, weakness and headaches. Negative for tremors, speech change, focal weakness, seizures and loss of consciousness.  Endo/Heme/Allergies: Does not bruise/bleed easily.  Psychiatric/Behavioral: Negative for substance abuse. The patient is nervous/anxious.   All other systems reviewed and are negative.   Labs:  Recent Labs  08/15/16 1315 08/15/16 1829 08/16/16 0020 08/16/16 0631  TROPONINI <0.03 <0.03 <0.03 <0.03   Lab Results  Component Value Date   WBC 7.5 08/16/2016   HGB 13.5 08/16/2016   HCT 40.4 08/16/2016   MCV 87.9 08/16/2016   PLT 300 08/16/2016     Recent Labs Lab 08/16/16 0631  NA 135  K 3.7  CL 106  CO2 26  BUN 20  CREATININE 0.64  CALCIUM 8.7*  GLUCOSE 92   No results found for: CHOL, HDL, LDLCALC, TRIG No results found for: DDIMER  Radiology/Studies:  Dg Chest 2 View  Result Date: 08/15/2016 IMPRESSION: 1. No acute cardiopulmonary disease.  2. Cardiac pacer with lead tips in right atrium and right ventricle. Heart size normal. Electronically Signed   By: Marcello Moores  Register   On: 08/15/2016 14:40   Ct Head Wo Contrast  Addendum Date: 08/15/2016   ADDENDUM REPORT: 08/15/2016 13:19 ADDENDUM: Critical Value/emergent results were called by telephone at the time of interpretation on 08/15/2016 at 1:18 pm to Dr. Alfred Levins, who verbally acknowledged these results. Electronically Signed   By: Rolm Baptise M.D.   On: 08/15/2016 13:19   I spoke with radiologist on call this morning for [facility] who reviewed this study and stated it appears this addendum was placed just to document the results had been called to the ED MD and there is no critical finding.    Result Date:  08/15/2016 IMPRESSION: No acute intracranial abnormality. Atrophy, chronic microvascular disease. Electronically Signed: By: Rolm Baptise M.D. On: 08/15/2016 13:13    EKG: Interpreted by me showed: A-paced, 74 bpm Telemetry: Interpreted by me showed: A-paced, 70s bpm, rare V-paced beats, rare PVC  Weights: Filed Weights   08/15/16 1258  Weight: 164 lb (74.4 kg)     Physical Exam: Blood pressure 139/83, pulse 71, temperature 98.1 F (36.7 C), temperature source Oral, resp. rate 17, height 5' 4"  (1.626 m), weight 164 lb (74.4 kg), SpO2 96 %. Body mass index is 28.15 kg/m. General: Well developed, well nourished, in no acute distress. Head: Normocephalic, atraumatic, sclera non-icteric, no xanthomas, nares are without discharge.  Neck: Negative for carotid bruits. JVD not elevated. Lungs: Clear bilaterally to auscultation without wheezes, rales, or rhonchi. Breathing is unlabored. Heart: RRR with S1 S2. No murmurs, rubs, or gallops appreciated. Abdomen: Soft, non-tender, non-distended with normoactive bowel sounds. No hepatomegaly. No rebound/guarding. No obvious abdominal masses. Msk:  Strength and tone appear normal for age. Extremities: No clubbing or cyanosis.  Trace pre-tibial edema. Distal pedal pulses are 2+ and equal bilaterally. Neuro: Alert and oriented X 3. No facial asymmetry. No focal deficit. Moves all extremities spontaneously. Psych:  Responds to questions appropriately with a normal affect.    Assessment and Plan:  Principal Problem:   Chest pain with moderate risk for cardiac etiology Active Problems:   Palpitations   Essential hypertension   Coronary artery disease, non-occlusive   Persistent atrial fibrillation (HCC)   Pacemaker   Dilatation of thoracic aorta (HCC)   HLD (hyperlipidemia)   Macular degeneration    1. Chest pain with moderate risk of cardiac etiology: -Prior LHC on 12/07/2014 with nonobstructive CAD as above -Has ruled out -Currently without chest pain -Schedule for [facility] today (will be this afternoon as nuclear med is booked). Risks and benefits of this test explained in detail. She agrees to proceed -She reports she cannot take ASA, 2/2 Eliquis. If found to have significant CAD could place on dual therapy -Would expect CE to be positive given her prolonged symptoms if this were ACS  2. Palpitations/dizziness: -PPM interrogated x 2 as above without significant arrhythmia or finding -Replete potassium to goal of 4.0 -Check magnesium and TSH -Does not appear to be arrhythmia related -She would to establish with [place] for general cardiology and EP  3. Persistent Afib s/p TEE/DCCV 02/2016: -Device interrogation without any evidence of significant atrial arrhythmia -Sotalol  -Eliquis -CHADS2VASc 4 (HTN, age x 2, female)  4. Sinus node dysfunction: -Device appears to be functioning well  5. Nonobstructive CAD: -Ischemia workup as above -On Eliquis in place of ASA  6. Dilated thoracic aorta: -Check echo and compare to prior study in Care Everywhere  7. HTN: -Improved -Continue current medications   Signed, Marcille Blanco [facility] Pager: [phone] 08/16/2016,  8:24 AM

## 2016-08-16 NOTE — Discharge Summary (Signed)
[facility] NAME: Chelsey Williams    MR#:  [id number]  DATE OF BIRTH:  16-Mar-1928  DATE OF ADMISSION:  08/15/2016 ADMITTING PHYSICIAN: Dustin Flock, MD  DATE OF DISCHARGE: 08/16/2016  PRIMARY CARE PHYSICIAN: Clarisse Gouge, MD    ADMISSION DIAGNOSIS:  Near syncope [R55] Nonspecific chest pain [R07.9] Cardiac arrhythmia, unspecified cardiac arrhythmia type [I49.9] Fatigue, unspecified type [R53.83]  DISCHARGE DIAGNOSIS:  Principal Problem:   Chest pain with moderate risk for cardiac etiology Active Problems:   Palpitations   Coronary artery disease, non-occlusive   Persistent atrial fibrillation (HCC)   Pacemaker   Essential hypertension   Dilatation of thoracic aorta (HCC)   HLD (hyperlipidemia)   Macular degeneration   SECONDARY DIAGNOSIS:   Past Medical History:  Diagnosis Date  . Anxiety   . Bilateral hearing loss   . Coronary artery disease, non-occlusive    a. LHC 11/2014: R dom, LM nl, mLAD 30-40, dLAD minor irregs, LCx minor irregs, RCA minor irregs w/ associated ectasia  . Depression   . GERD (gastroesophageal reflux disease)   . Glaucoma   . Hyperlipemia   . Hypertension   . Macular degeneration   . OA (osteoarthritis)   . Pacemaker    a. Medtronic Adapta ADDRL1 dual-chamber pacemaker (serial number [id number]) with Medtronic 1165 atrial lead (serial number [id number]) and Medtronic 5076 ventricular lead (serial number [id number]), all implanted 12/08/2014 by Dr. Jacklynn Barnacle at [facility] [phone].    Marland Kitchen Persistent atrial fibrillation ([place])    a. s/p DCCV 02/2016; b. sotalol and Eliquis; c. CHADS2VASc --> 4 (HTN, age x 2, female)  . Sinus node dysfunction [facility])     HOSPITAL COURSE:   She is 83 year old presenting with chest pain. 1. Chest pain negative cardiac enzymes Patient was not interested in having a stress test on admission, but after being explained by cardio- she agreed. Stress test  done.  2. Accelerated hypertension Continue therapy with lisinopril, add low-dose hydralazine .  3. Atrial fibrillation continue eliquis and sotalol  4. gerd continue omerperozle  5. COPD continue inhaler taking at home  DISCHARGE CONDITIONS:   Stable.  CONSULTS OBTAINED:  Treatment Team:  Thayer Headings, MD  DRUG ALLERGIES:   Allergies  Allergen Reactions  . Cephalexin Anaphylaxis    Shock / Unconsciousness  . Diltiazem Hcl Swelling  . Hydrochlorothiazide Other (See Comments)    dizziness  . Prednisone Shortness Of Breath    SOB  . Felodipine Nausea Only    Gastrointestinal problems, e.g., nausea, vomiting, diarrheaShock / Unconsciousnessmuscle tenderness  . Statins Other (See Comments)  . Amlodipine Other (See Comments) and Palpitations  . Atenolol Palpitations    heart palpitations    DISCHARGE MEDICATIONS:   Current Discharge Medication List    START taking these medications   Details  hydrALAZINE (APRESOLINE) 10 MG tablet Take 1 tablet (10 mg total) by mouth 3 (three) times daily. Qty: 90 tablet, Refills: 0      CONTINUE these medications which have NOT CHANGED   Details  albuterol (PROVENTIL HFA;VENTOLIN HFA) 108 (90 Base) MCG/ACT inhaler Inhale into the lungs.    apixaban (ELIQUIS) 5 MG TABS tablet Take 5 mg by mouth 2 (two) times daily.     cholestyramine (QUESTRAN) 4 g packet Take 4 g by mouth 3 (three) times daily.     HYDROcodone-acetaminophen (NORCO/VICODIN) 5-325 MG tablet take 1 tablet by mouth every 6 hours if needed for pain for 5 days  Refills: 0    ibuprofen (ADVIL,MOTRIN) 200 MG tablet Take 200 mg by mouth 2 (two) times daily as needed.     !! lisinopril (PRINIVIL,ZESTRIL) 20 MG tablet Take by mouth.    !! lisinopril (PRINIVIL,ZESTRIL) 40 MG tablet Take by mouth.    omeprazole (PRILOSEC) 20 MG capsule Take 20 mg by mouth daily as needed.     sotalol (BETAPACE) 80 MG tablet Take 80 mg by mouth every 12 (twelve) hours. Refills:  0     !! - Potential duplicate medications found. Please discuss with provider.       DISCHARGE INSTRUCTIONS:    Follow with PMD in 1-2 weeks.  If you experience worsening of your admission symptoms, develop shortness of breath, life threatening emergency, suicidal or homicidal thoughts you must seek medical attention immediately by calling 911 or calling your MD immediately  if symptoms less severe.  You Must read complete instructions/literature along with all the possible adverse reactions/side effects for all the Medicines you take and that have been prescribed to you. Take any new Medicines after you have completely understood and accept all the possible adverse reactions/side effects.   Please note  You were cared for by a hospitalist during your hospital stay. If you have any questions about your discharge medications or the care you received while you were in the hospital after you are discharged, you can call the unit and asked to speak with the hospitalist on call if the hospitalist that took care of you is not available. Once you are discharged, your primary care physician will handle any further medical issues. Please note that NO REFILLS for any discharge medications will be authorized once you are discharged, as it is imperative that you return to your primary care physician (or establish a relationship with a primary care physician if you do not have one) for your aftercare needs so that they can reassess your need for medications and monitor your lab values.    Today   CHIEF COMPLAINT:   Chief Complaint  Patient presents with  . Chest Pain    HISTORY OF PRESENT ILLNESS:  Chelsey Williams  is a 83 y.o. female with a known history of afib, anxiety, gerd, s/p pacemaker, hyperlipidemia, essential hypertention, who presents to er with c/o chest pain ongoing for past few weeks. Who presents to er with worsening chest pain today. Initially wanted to go go home and then change her  mind. Patient denies any shortness of breath or palpitations or syncope. Patient reports that her blood pressure fluctuates and when it increases her chest pain occurs. But if she is given blood pressure is adjusted to much that her blood pressure drops.   VITAL SIGNS:  Blood pressure (!) 150/87, pulse 92, temperature 98.2 F (36.8 C), temperature source Oral, resp. rate 18, height 5' 4"  (1.626 m), weight 74.4 kg (164 lb), SpO2 95 %.  I/O:   Intake/Output Summary (Last 24 hours) at 08/16/16 1343 Last data filed at 08/16/16 0700  Gross per 24 hour  Intake           908.75 ml  Output                0 ml  Net           908.75 ml    PHYSICAL EXAMINATION:  GENERAL:  83 y.o.-year-old patient lying in the bed with no acute distress.  EYES: Pupils equal, round, reactive to light and accommodation. No scleral icterus. Extraocular muscles  intact.  HEENT: Head atraumatic, normocephalic. Oropharynx and nasopharynx clear.  NECK:  Supple, no jugular venous distention. No thyroid enlargement, no tenderness.  LUNGS: Normal breath sounds bilaterally, no wheezing, rales,rhonchi or crepitation. No use of accessory muscles of respiration.  CARDIOVASCULAR: S1, S2 normal. No murmurs, rubs, or gallops.  ABDOMEN: Soft, non-tender, non-distended. Bowel sounds present. No organomegaly or mass.  EXTREMITIES: No pedal edema, cyanosis, or clubbing.  NEUROLOGIC: Cranial nerves II through XII are intact. Muscle strength 5/5 in all extremities. Sensation intact. Gait not checked.  PSYCHIATRIC: The patient is alert and oriented x 3.  SKIN: No obvious rash, lesion, or ulcer.   DATA REVIEW:   CBC  Recent Labs Lab 08/16/16 0631  WBC 7.5  HGB 13.5  HCT 40.4  PLT 300    Chemistries   Recent Labs Lab 08/16/16 0631  NA 135  K 3.7  CL 106  CO2 26  GLUCOSE 92  BUN 20  CREATININE 0.64  CALCIUM 8.7*  MG 1.8    Cardiac Enzymes  Recent Labs Lab 08/16/16 0631  TROPONINI <0.03    Microbiology  Results  Results for orders placed or performed in visit on 04/03/12  Urine culture     Status: None   Collection Time: 04/03/12  2:31 PM  Result Value Ref Range Status   Micro Text Report   Final       SOURCE: CLEAN CATCH    COMMENT                   MIXED BACTERIAL ORGANISMS   COMMENT                   RESULTS SUGGESTIVE OF CONTAMINATION   ANTIBIOTIC                                                        RADIOLOGY:  Dg Chest 2 View  Result Date: 08/15/2016 CLINICAL DATA:  Chest pain. EXAM: CHEST  2 VIEW COMPARISON:  No recent . FINDINGS: Cardiac pacer with lead tips in right atrium and right ventricle. Cardiomegaly with normal pulmonary vascularity. No focal infiltrate. No pleural effusion or pneumothorax. Is in the spine. IMPRESSION: 1. No acute cardiopulmonary disease. 2. Cardiac pacer with lead tips in right atrium and right ventricle. Heart size normal. Electronically Signed   By: Marcello Moores  Register   On: 08/15/2016 14:40   Ct Head Wo Contrast  Addendum Date: 08/15/2016   ADDENDUM REPORT: 08/15/2016 13:19 ADDENDUM: Critical Value/emergent results were called by telephone at the time of interpretation on 08/15/2016 at 1:18 pm to Dr. Alfred Levins, who verbally acknowledged these results. Electronically Signed   By: Rolm Baptise M.D.   On: 08/15/2016 13:19   Result Date: 08/15/2016 CLINICAL DATA:  Headache, weakness EXAM: CT HEAD WITHOUT CONTRAST TECHNIQUE: Contiguous axial images were obtained from the base of the skull through the vertex without intravenous contrast. COMPARISON:  None. FINDINGS: Brain: There is atrophy and chronic small vessel disease changes. No acute intracranial abnormality. Specifically, no hemorrhage, hydrocephalus, mass lesion, acute infarction, or significant intracranial injury. Vascular: No hyperdense vessel or unexpected calcification. Skull: No acute calvarial abnormality. Sinuses/Orbits: Visualized paranasal sinuses and mastoids clear. Orbital soft tissues  unremarkable. Other: None IMPRESSION: No acute intracranial abnormality. Atrophy, chronic microvascular disease. Electronically Signed: By: Rolm Baptise M.D.  On: 08/15/2016 13:13    EKG:   Orders placed or performed during the hospital encounter of 08/15/16  . ED EKG within 10 minutes  . ED EKG within 10 minutes  . EKG 12-Lead  . EKG 12-Lead  . ED EKG  . ED EKG      Management plans discussed with the patient, family and they are in agreement.  CODE STATUS:     Code Status Orders        Start     Ordered   08/15/16 1817  Full code  Continuous     08/15/16 1816    Code Status History    Date Active Date Inactive Code Status Order ID Comments User Context   This patient has a current code status but no historical code status.    Advance Directive Documentation     Most Recent Value  Type of Advance Directive  Healthcare Power of Attorney  Pre-existing out of facility DNR order (yellow form or pink MOST form)  -  "MOST" Form in Place?  -      TOTAL TIME TAKING CARE OF THIS PATIENT: 35 minutes.    Vaughan Basta M.D on 08/16/2016 at 1:43 PM  Between 7am to 6pm - Pager - [phone]  After 6pm go to [email or web address] - password [email or web address] [facility]  Office  [phone]  CC: Primary care physician; Clarisse Gouge, MD   Note: This dictation was prepared with Dragon dictation along with smaller phrase technology. Any transcriptional errors that result from this process are unintentional.

## 2016-08-17 ENCOUNTER — Telehealth: Payer: Self-pay | Admitting: Cardiovascular Disease

## 2016-08-17 NOTE — Telephone Encounter (Signed)
Per Dr. Rockey Situ pt was in [facility] and needs f/u appt with Dr. Caryl Comes. Pt has a pacer. Currently is being followed by [facility], but wants local . Unable to leave a msg to contact pt to schedule. Letter sent.

## 2016-09-04 ENCOUNTER — Ambulatory Visit: Payer: Medicare Other | Admitting: Podiatry

## 2016-10-24 ENCOUNTER — Encounter: Payer: Medicare Other | Admitting: Internal Medicine

## 2016-11-06 ENCOUNTER — Emergency Department: Payer: Medicare Other

## 2016-11-06 ENCOUNTER — Other Ambulatory Visit: Payer: Self-pay

## 2016-11-06 ENCOUNTER — Observation Stay
Admission: EM | Admit: 2016-11-06 | Discharge: 2016-11-07 | Disposition: A | Discharge status: Home / Self Care | Payer: Medicare Other | Attending: Specialist | Admitting: Specialist

## 2016-11-06 DIAGNOSIS — Z9114 Patient's other noncompliance with medication regimen: Secondary | ICD-10-CM | POA: Diagnosis not present

## 2016-11-06 DIAGNOSIS — H3530 Unspecified macular degeneration: Secondary | ICD-10-CM | POA: Diagnosis not present

## 2016-11-06 DIAGNOSIS — K219 Gastro-esophageal reflux disease without esophagitis: Secondary | ICD-10-CM | POA: Insufficient documentation

## 2016-11-06 DIAGNOSIS — E785 Hyperlipidemia, unspecified: Secondary | ICD-10-CM | POA: Insufficient documentation

## 2016-11-06 DIAGNOSIS — R0789 Other chest pain: Principal | ICD-10-CM | POA: Insufficient documentation

## 2016-11-06 DIAGNOSIS — F329 Major depressive disorder, single episode, unspecified: Secondary | ICD-10-CM | POA: Insufficient documentation

## 2016-11-06 DIAGNOSIS — I700 Atherosclerosis of aorta: Secondary | ICD-10-CM | POA: Diagnosis not present

## 2016-11-06 DIAGNOSIS — K5730 Diverticulosis of large intestine without perforation or abscess without bleeding: Secondary | ICD-10-CM | POA: Insufficient documentation

## 2016-11-06 DIAGNOSIS — I2510 Atherosclerotic heart disease of native coronary artery without angina pectoris: Secondary | ICD-10-CM | POA: Diagnosis not present

## 2016-11-06 DIAGNOSIS — F419 Anxiety disorder, unspecified: Secondary | ICD-10-CM | POA: Insufficient documentation

## 2016-11-06 DIAGNOSIS — K449 Diaphragmatic hernia without obstruction or gangrene: Secondary | ICD-10-CM | POA: Diagnosis not present

## 2016-11-06 DIAGNOSIS — I481 Persistent atrial fibrillation: Secondary | ICD-10-CM | POA: Diagnosis not present

## 2016-11-06 DIAGNOSIS — K529 Noninfective gastroenteritis and colitis, unspecified: Secondary | ICD-10-CM | POA: Diagnosis not present

## 2016-11-06 DIAGNOSIS — Z888 Allergy status to other drugs, medicaments and biological substances status: Secondary | ICD-10-CM | POA: Insufficient documentation

## 2016-11-06 DIAGNOSIS — M8588 Other specified disorders of bone density and structure, other site: Secondary | ICD-10-CM | POA: Insufficient documentation

## 2016-11-06 DIAGNOSIS — M1990 Unspecified osteoarthritis, unspecified site: Secondary | ICD-10-CM | POA: Insufficient documentation

## 2016-11-06 DIAGNOSIS — R072 Precordial pain: Secondary | ICD-10-CM

## 2016-11-06 DIAGNOSIS — K567 Ileus, unspecified: Secondary | ICD-10-CM | POA: Diagnosis not present

## 2016-11-06 DIAGNOSIS — Z7901 Long term (current) use of anticoagulants: Secondary | ICD-10-CM | POA: Insufficient documentation

## 2016-11-06 DIAGNOSIS — M545 Low back pain: Secondary | ICD-10-CM | POA: Insufficient documentation

## 2016-11-06 DIAGNOSIS — Z950 Presence of cardiac pacemaker: Secondary | ICD-10-CM | POA: Insufficient documentation

## 2016-11-06 DIAGNOSIS — H409 Unspecified glaucoma: Secondary | ICD-10-CM | POA: Diagnosis not present

## 2016-11-06 DIAGNOSIS — Z9049 Acquired absence of other specified parts of digestive tract: Secondary | ICD-10-CM | POA: Insufficient documentation

## 2016-11-06 DIAGNOSIS — I10 Essential (primary) hypertension: Secondary | ICD-10-CM | POA: Diagnosis not present

## 2016-11-06 DIAGNOSIS — I482 Chronic atrial fibrillation: Secondary | ICD-10-CM | POA: Diagnosis not present

## 2016-11-06 DIAGNOSIS — Z79899 Other long term (current) drug therapy: Secondary | ICD-10-CM | POA: Insufficient documentation

## 2016-11-06 DIAGNOSIS — R0989 Other specified symptoms and signs involving the circulatory and respiratory systems: Secondary | ICD-10-CM

## 2016-11-06 LAB — BASIC METABOLIC PANEL
ANION GAP: 8 (ref 5–15)
BUN: 15 mg/dL (ref 6–20)
CALCIUM: 9.5 mg/dL (ref 8.9–10.3)
CO2: 25 mmol/L (ref 22–32)
CREATININE: 0.74 mg/dL (ref 0.44–1.00)
Chloride: 103 mmol/L (ref 101–111)
Glucose, Bld: 102 mg/dL — ABNORMAL HIGH (ref 65–99)
Potassium: 3.9 mmol/L (ref 3.5–5.1)
SODIUM: 136 mmol/L (ref 135–145)

## 2016-11-06 LAB — CBC
HCT: 39.3 % (ref 35.0–47.0)
HEMOGLOBIN: 13.1 g/dL (ref 12.0–16.0)
MCH: 29.4 pg (ref 26.0–34.0)
MCHC: 33.3 g/dL (ref 32.0–36.0)
MCV: 88.4 fL (ref 80.0–100.0)
PLATELETS: 340 10*3/uL (ref 150–440)
RBC: 4.45 MIL/uL (ref 3.80–5.20)
RDW: 14.4 % (ref 11.5–14.5)
WBC: 9.2 10*3/uL (ref 3.6–11.0)

## 2016-11-06 LAB — TROPONIN I: Troponin I: <0.03 ng/mL (ref <0.03)

## 2016-11-06 MED ORDER — IOPAMIDOL (ISOVUE-370) INJECTION 76%
100.0000 mL | Freq: Once | INTRAVENOUS | Status: AC | PRN
  Administered 2016-11-06: 100 mL via INTRAVENOUS

## 2016-11-06 MED ORDER — SODIUM CHLORIDE 0.9 % IV BOLUS (SEPSIS)
1000.0000 mL | Freq: Once | INTRAVENOUS | Status: AC
  Administered 2016-11-06: 1000 mL via INTRAVENOUS

## 2016-11-06 NOTE — ED Notes (Signed)
Pt states elevated BP today and then took her medication for BP. Pt states central CP x 1 week. States pain goes straight through to back. States hx HTN and COPD. Does NOT wear home oxygen. States gets SOB with movement. States ankles feel swollen. No pitting edema noted. Pt states she is losing her voice. States nausea as well. States she feels like her BP is lower now after taking a medication- states "it goes wild. It goes up and down with the medication I'm on." pt denies CHF or taking lasix. Pt is HOH, alert and oriented.

## 2016-11-06 NOTE — ED Triage Notes (Signed)
Pt presents to ED with c/o central CP with radiation into the back and HTN that started around 6am this morning. Pt also reports nausea, but denies emesis. Pt with cardiac history significant for pacemaker implant in 2017. Pt reports taking medications as prescribed. Pt is A&O, in NAD, with RR even, regular, and unlabored.

## 2016-11-06 NOTE — ED Provider Notes (Signed)
[facility] Emergency Department Provider Note  ____________________________________________  Time seen: Approximately 11:36 PM  I have reviewed the triage vital signs and the nursing notes.   HISTORY  Chief Complaint Chest Pain; Hypertension; and Nausea    HPI Chelsey Williams is a 83 y.o. female who complains of central chest pain radiating to her back that is been off and on for the past week, worse since 6 AM this morning. Associated with nausea and diaphoresis. Worse with exertion but also worse with changes in position and pressing on her anterior chest. Feels that it is related to her blood pressure which is labile and poorly controlled on lisinopril and hydralazine per the patient's report. Patient feels that her overall symptoms have been worse ever since she was started on sotalol by her cardiologist.No alleviating factors. Pain is moderate intensity, dull aching.     Past Medical History:  Diagnosis Date  . Anxiety   . Bilateral hearing loss   . Coronary artery disease, non-occlusive    a. LHC 11/2014: R dom, LM nl, mLAD 30-40, dLAD minor irregs, LCx minor irregs, RCA minor irregs w/ associated ectasia  . Depression   . GERD (gastroesophageal reflux disease)   . Glaucoma   . Hyperlipemia   . Hypertension   . Macular degeneration   . OA (osteoarthritis)   . Pacemaker    a. Medtronic Adapta ADDRL1 dual-chamber pacemaker (serial number [id number]) with Medtronic 9678 atrial lead (serial number [id number]) and Medtronic 5076 ventricular lead (serial number [id number]), all implanted 12/08/2014 by Dr. Jacklynn Barnacle at [facility] [phone].    Marland Kitchen Persistent atrial fibrillation ([place])    a. s/p DCCV 02/2016; b. sotalol and Eliquis; c. CHADS2VASc --> 4 (HTN, age x 2, female)  . Sinus node dysfunction [facility])      Patient Active Problem List   Diagnosis Date Noted  . Palpitations 08/16/2016  . Coronary artery disease, non-occlusive 08/16/2016  .  Persistent atrial fibrillation ([place]) 08/16/2016  . Pacemaker 08/16/2016  . Essential hypertension 08/16/2016  . Dilatation of thoracic aorta ([place]) 08/16/2016  . HLD (hyperlipidemia) 08/16/2016  . Macular degeneration 08/16/2016  . Chest pain with moderate risk for cardiac etiology 08/15/2016     Past Surgical History:  Procedure Laterality Date  . CHOLECYSTECTOMY    . knee replacmen    . PACEMAKER IMPLANT    . throid node       Prior to Admission medications   Medication Sig Start Date End Date Taking? Authorizing Provider  albuterol (PROVENTIL HFA;VENTOLIN HFA) 108 (90 Base) MCG/ACT inhaler Inhale into the lungs. 02/21/16 02/20/17  [provider]  apixaban (ELIQUIS) 5 MG TABS tablet Take 5 mg by mouth 2 (two) times daily.  04/06/16   [provider]  cholestyramine (QUESTRAN) 4 g packet Take 4 g by mouth 3 (three) times daily.  04/04/16 04/04/17  [provider]  hydrALAZINE (APRESOLINE) 10 MG tablet Take 1 tablet (10 mg total) by mouth 3 (three) times daily. 08/16/16   Vaughan Basta, MD  HYDROcodone-acetaminophen (NORCO/VICODIN) 5-325 MG tablet take 1 tablet by mouth every 6 hours if needed for pain for 5 days 04/06/16   [provider]  ibuprofen (ADVIL,MOTRIN) 200 MG tablet Take 200 mg by mouth 2 (two) times daily as needed.     [provider]  lisinopril (PRINIVIL,ZESTRIL) 20 MG tablet Take 20 mg by mouth 2 (two) times daily.     [provider]  omeprazole (PRILOSEC) 20 MG capsule Take 20 mg  by mouth daily as needed.     [provider]  sotalol (BETAPACE) 80 MG tablet Take 80 mg by mouth every 12 (twelve) hours. 03/24/16   [provider]     Allergies Cephalexin; Diltiazem hcl; Hydrochlorothiazide; Prednisone; Felodipine; Statins; Amlodipine; and Atenolol   Family History  Problem Relation Age of Onset  . Hypertension Mother     Social History Social History  Substance Use Topics  .  Smoking status: Never Smoker  . Smokeless tobacco: Never Used  . Alcohol use No    Review of Systems  Constitutional:   No fever or chills.  ENT:   No sore throat. No rhinorrhea. Cardiovascular:   Positive as above without syncope. Respiratory:   Positive mild shortness of breath without cough. Gastrointestinal:   Negative for abdominal pain, vomiting and diarrhea.  Musculoskeletal:   Negative for focal pain or swelling All other systems reviewed and are negative except as documented above in ROS and HPI.  ____________________________________________   PHYSICAL EXAM:  VITAL SIGNS: ED Triage Vitals  Enc Vitals Group     BP 11/06/16 2159 (!) 137/108     Pulse Rate 11/06/16 2159 78     Resp 11/06/16 2159 18     Temp --      Temp src --      SpO2 11/06/16 2159 99 %     Weight 11/06/16 1957 164 lb (74.4 kg)     Height 11/06/16 1957 5' 4"  (1.626 m)     Head Circumference --      Peak Flow --      Pain Score 11/06/16 1956 8     Pain Loc --      Pain Edu? --      Excl. in [place]? --     Vital signs reviewed, nursing assessments reviewed.   Constitutional:   Alert and oriented. Well appearing and in no distress. Eyes:   No scleral icterus.  EOMI. No nystagmus. No conjunctival pallor. PERRL. ENT   Head:   Normocephalic and atraumatic.   Nose:   No congestion/rhinnorhea.    Mouth/Throat:   MMM, no pharyngeal erythema. No peritonsillar mass.    Neck:   No meningismus. Full ROM Hematological/Lymphatic/Immunilogical:   No cervical lymphadenopathy. Cardiovascular:   RRR. Symmetric bilateral radial and DP pulses.  No murmurs.  Respiratory:   Normal respiratory effort without tachypnea/retractions. Breath sounds are clear and equal bilaterally. No wheezes/rales/rhonchi. Gastrointestinal:   Soft With mild epigastric tenderness. Non distended. There is no CVA tenderness.  No rebound, rigidity, or guarding. Genitourinary:   deferred Musculoskeletal:   Normal range of motion  in all extremities. No joint effusions.  No lower extremity tenderness.  No edema.Chest wall mildly tender to touch over the sternum Neurologic:   Normal speech and language.  Motor grossly intact. No gross focal neurologic deficits are appreciated.  Skin:    Skin is warm, dry and intact. No rash noted.  No petechiae, purpura, or bullae.  ____________________________________________    LABS (pertinent positives/negatives) (all labs ordered are listed, but only abnormal results are displayed) Labs Reviewed  BASIC METABOLIC PANEL - Abnormal; Notable for the following:       Result Value   Glucose, Bld 102 (*)    All other components within normal limits  CBC  TROPONIN I   ____________________________________________   EKG  Interpreted by me Atrial paced rhythm, rate of 75. Normal axis. First-degree AV block. Poor R-wave progression in anterior precordial leads. No  acute ischemic changes.  ____________________________________________    RADIOLOGY  Dg Chest 2 View  Result Date: 11/06/2016 CLINICAL DATA:  83 y/o  F; chest pain and shortness of breath. EXAM: CHEST  2 VIEW COMPARISON:  08/15/2016 chest radiograph FINDINGS: Stable normal cardiac silhouette. Two lead pacemaker. Aortic atherosclerosis with calcification. Clear lungs. No pleural effusion or pneumothorax. Bones are unremarkable. IMPRESSION: No acute pulmonary process identified. Electronically Signed   By: Kristine Garbe M.D.   On: 11/06/2016 20:35   Ct Angio Chest Aorta W And/or Wo Contrast  Result Date: 11/06/2016 CLINICAL DATA:  83 year old female with chest pain and elevated blood pressure. EXAM: CT ANGIOGRAPHY CHEST, ABDOMEN AND PELVIS TECHNIQUE: Multidetector CT imaging through the chest, abdomen and pelvis was performed using the standard protocol during bolus administration of intravenous contrast. Multiplanar reconstructed images and MIPs were obtained and reviewed to evaluate the vascular anatomy.  CONTRAST:  100 cc Isovue 370 COMPARISON:  Chest radiograph dated 11/06/2016 FINDINGS: CTA CHEST FINDINGS Cardiovascular: Top-normal cardiac size. There is coronary vascular calcification primarily involving the LAD and left circumflex artery. There is no pericardial effusion. Left pectoral pacemaker device noted. There is moderate atherosclerotic calcification of the thoracic aorta. The ascending aorta measures up to 4.2 cm in diameter. The origins of the great vessels of the aortic arch appear patent. There is no CT evidence of pulmonary artery embolus. Mediastinum/Nodes: There is no hilar or mediastinal adenopathy. Is a small hiatal hernia. The esophagus is grossly unremarkable. There is irregular and nodular appearance of the left thyroid gland. Lungs/Pleura: Left lung base linear atelectasis/ scarring. There are small scattered pneumatoceles. There is no focal consolidation, pleural effusion, or pneumothorax. There is a 3 mm right lower lobe subpleural nodule (series 9, image 27). Punctate right lower lobe subpleural calcified granuloma. The central airways are patent. Musculoskeletal: The chest wall soft tissues appear unremarkable. Osteopenia with degenerative changes of the spine. No acute osseous pathology. Review of the MIP images confirms the above findings. CTA ABDOMEN AND PELVIS FINDINGS VASCULAR Aorta: Moderate atherosclerotic disease. No aneurysmal dilatation or dissection. Celiac: Patent without evidence of aneurysm, dissection, vasculitis or significant stenosis. Accessory left hepatic artery from the left gastric artery. SMA: Patent without evidence of aneurysm, dissection, vasculitis or significant stenosis. Renals: Both renal arteries are patent without evidence of aneurysm, dissection, vasculitis, fibromuscular dysplasia or significant stenosis. IMA: Patent without evidence of aneurysm, dissection, vasculitis or significant stenosis. Inflow: Atherosclerotic calcification of the iliac vasculature.  No aneurysmal dilatation or evidence of dissection. Veins: No obvious venous abnormality within the limitations of this arterial phase study. Review of the MIP images confirms the above findings. NON-VASCULAR No intra-abdominal free air or free fluid. Hepatobiliary: Cholecystectomy.  The liver is unremarkable. Pancreas: Unremarkable. No pancreatic ductal dilatation or surrounding inflammatory changes. Spleen: Normal in size without focal abnormality. Adrenals/Urinary Tract: There is a 13 mm indeterminate left adrenal nodule, possibly an adenoma. MRI may provide better characterization. The right adrenal gland is unremarkable. There are bilateral renal hypodense lesions measuring up to 11 mm in the interpolar aspect of the right kidney. The larger lesions demonstrate fluid attenuation most consistent with cysts. A subcentimeter left renal upper pole exophytic lesion is not well characterized but also likely represents a cyst. There is no hydronephrosis on either side. The visualized ureters and urinary bladder appear unremarkable. Stomach/Bowel: Small hiatal hernia. There is mild thickening of a segment of distal small bowel in the right hemipelvis with associated mild inflammatory changes concerning for enteritis. There is mild  dilatation of a loop of bowel proximal to this segment likely representing reactive ileus. There is no evidence of bowel obstruction. There is sigmoid diverticulosis without active inflammatory changes. There is moderate stool throughout the colon. Normal appendix. Lymphatic: No adenopathy. Reproductive: The uterus and the ovaries are grossly unremarkable as visualized. Other: None Musculoskeletal: Osteopenia multilevel degenerative changes of the spine with sclerotic changes of the lumbar vertebra. Multilevel disc desiccation with vacuum phenomena. No acute osseous pathology. Review of the MIP images confirms the above findings. IMPRESSION: 1. No CT evidence of aortic aneurysm for  dissection. Mild prominence of the ascending aorta measuring up to 4.2 cm in maximal diameter. Recommend annual imaging followup by CTA or MRA. This recommendation follows 2010 ACCF/AHA/AATS/ACR/ASA/SCA/SCAI/SIR/STS/SVM Guidelines for the Diagnosis and Management of Patients with Thoracic Aortic Disease. Circulation. 2010; 121: S962-E366 2. No CT evidence of pulmonary artery embolus. 3. Segmental enteritis of distal small bowel in the right hemipelvis with associated mild ileus. No bowel obstruction. Normal appendix. 4. Colonic diverticulosis.  No bowel obstruction.  Normal appendix. 5.  Aortic Atherosclerosis (ICD10-I70.0). Electronically Signed   By: Anner Crete M.D.   On: 11/06/2016 23:30   Ct Angio Abd/pel W And/or Wo Contrast  Result Date: 11/06/2016 CLINICAL DATA:  83 year old female with chest pain and elevated blood pressure. EXAM: CT ANGIOGRAPHY CHEST, ABDOMEN AND PELVIS TECHNIQUE: Multidetector CT imaging through the chest, abdomen and pelvis was performed using the standard protocol during bolus administration of intravenous contrast. Multiplanar reconstructed images and MIPs were obtained and reviewed to evaluate the vascular anatomy. CONTRAST:  100 cc Isovue 370 COMPARISON:  Chest radiograph dated 11/06/2016 FINDINGS: CTA CHEST FINDINGS Cardiovascular: Top-normal cardiac size. There is coronary vascular calcification primarily involving the LAD and left circumflex artery. There is no pericardial effusion. Left pectoral pacemaker device noted. There is moderate atherosclerotic calcification of the thoracic aorta. The ascending aorta measures up to 4.2 cm in diameter. The origins of the great vessels of the aortic arch appear patent. There is no CT evidence of pulmonary artery embolus. Mediastinum/Nodes: There is no hilar or mediastinal adenopathy. Is a small hiatal hernia. The esophagus is grossly unremarkable. There is irregular and nodular appearance of the left thyroid gland. Lungs/Pleura:  Left lung base linear atelectasis/ scarring. There are small scattered pneumatoceles. There is no focal consolidation, pleural effusion, or pneumothorax. There is a 3 mm right lower lobe subpleural nodule (series 9, image 27). Punctate right lower lobe subpleural calcified granuloma. The central airways are patent. Musculoskeletal: The chest wall soft tissues appear unremarkable. Osteopenia with degenerative changes of the spine. No acute osseous pathology. Review of the MIP images confirms the above findings. CTA ABDOMEN AND PELVIS FINDINGS VASCULAR Aorta: Moderate atherosclerotic disease. No aneurysmal dilatation or dissection. Celiac: Patent without evidence of aneurysm, dissection, vasculitis or significant stenosis. Accessory left hepatic artery from the left gastric artery. SMA: Patent without evidence of aneurysm, dissection, vasculitis or significant stenosis. Renals: Both renal arteries are patent without evidence of aneurysm, dissection, vasculitis, fibromuscular dysplasia or significant stenosis. IMA: Patent without evidence of aneurysm, dissection, vasculitis or significant stenosis. Inflow: Atherosclerotic calcification of the iliac vasculature. No aneurysmal dilatation or evidence of dissection. Veins: No obvious venous abnormality within the limitations of this arterial phase study. Review of the MIP images confirms the above findings. NON-VASCULAR No intra-abdominal free air or free fluid. Hepatobiliary: Cholecystectomy.  The liver is unremarkable. Pancreas: Unremarkable. No pancreatic ductal dilatation or surrounding inflammatory changes. Spleen: Normal in size without focal abnormality. Adrenals/Urinary Tract: There  is a 13 mm indeterminate left adrenal nodule, possibly an adenoma. MRI may provide better characterization. The right adrenal gland is unremarkable. There are bilateral renal hypodense lesions measuring up to 11 mm in the interpolar aspect of the right kidney. The larger lesions  demonstrate fluid attenuation most consistent with cysts. A subcentimeter left renal upper pole exophytic lesion is not well characterized but also likely represents a cyst. There is no hydronephrosis on either side. The visualized ureters and urinary bladder appear unremarkable. Stomach/Bowel: Small hiatal hernia. There is mild thickening of a segment of distal small bowel in the right hemipelvis with associated mild inflammatory changes concerning for enteritis. There is mild dilatation of a loop of bowel proximal to this segment likely representing reactive ileus. There is no evidence of bowel obstruction. There is sigmoid diverticulosis without active inflammatory changes. There is moderate stool throughout the colon. Normal appendix. Lymphatic: No adenopathy. Reproductive: The uterus and the ovaries are grossly unremarkable as visualized. Other: None Musculoskeletal: Osteopenia multilevel degenerative changes of the spine with sclerotic changes of the lumbar vertebra. Multilevel disc desiccation with vacuum phenomena. No acute osseous pathology. Review of the MIP images confirms the above findings. IMPRESSION: 1. No CT evidence of aortic aneurysm for dissection. Mild prominence of the ascending aorta measuring up to 4.2 cm in maximal diameter. Recommend annual imaging followup by CTA or MRA. This recommendation follows 2010 ACCF/AHA/AATS/ACR/ASA/SCA/SCAI/SIR/STS/SVM Guidelines for the Diagnosis and Management of Patients with Thoracic Aortic Disease. Circulation. 2010; 121: D974-B638 2. No CT evidence of pulmonary artery embolus. 3. Segmental enteritis of distal small bowel in the right hemipelvis with associated mild ileus. No bowel obstruction. Normal appendix. 4. Colonic diverticulosis.  No bowel obstruction.  Normal appendix. 5.  Aortic Atherosclerosis (ICD10-I70.0). Electronically Signed   By: Anner Crete M.D.   On: 11/06/2016 23:30     ____________________________________________   PROCEDURES Procedures  ____________________________________________   INITIAL IMPRESSION / ASSESSMENT AND PLAN / ED COURSE  Pertinent labs & imaging results that were available during my care of the patient were reviewed by me and considered in my medical decision making (see chart for details).  Patient presents with one week of persistent precordial chest pain. Pain is somewhat atypical in that it is both worse with exertion and associated with nausea and diaphoresis also having some features of chest wall pain.  Due to age and comorbidities, proceeded with workup which is negative for acute ischemic changes on the EKG, negative troponin. Obtain CT angiogram due to her history of thoracic aortic aneurysm. Aneurysm diameter is 4.2 cm today which is stable from the patient's reported baseline. Case discussed with hospitalist for further evaluation.      ____________________________________________   FINAL CLINICAL IMPRESSION(S) / ED DIAGNOSES  Final diagnoses:  Precordial pain      New Prescriptions   No medications on file     Portions of this note were generated with dragon dictation software. Dictation errors may occur despite best attempts at proofreading.    Carrie Mew, MD 11/06/16 2342

## 2016-11-07 DIAGNOSIS — I712 Thoracic aortic aneurysm, without rupture: Secondary | ICD-10-CM

## 2016-11-07 DIAGNOSIS — I700 Atherosclerosis of aorta: Secondary | ICD-10-CM

## 2016-11-07 DIAGNOSIS — I10 Essential (primary) hypertension: Secondary | ICD-10-CM | POA: Diagnosis not present

## 2016-11-07 DIAGNOSIS — R0789 Other chest pain: Secondary | ICD-10-CM

## 2016-11-07 DIAGNOSIS — I2510 Atherosclerotic heart disease of native coronary artery without angina pectoris: Secondary | ICD-10-CM | POA: Diagnosis not present

## 2016-11-07 DIAGNOSIS — R0989 Other specified symptoms and signs involving the circulatory and respiratory systems: Secondary | ICD-10-CM | POA: Diagnosis not present

## 2016-11-07 DIAGNOSIS — F419 Anxiety disorder, unspecified: Secondary | ICD-10-CM

## 2016-11-07 HISTORY — DX: Other chest pain: R07.89

## 2016-11-07 LAB — BASIC METABOLIC PANEL
ANION GAP: 6 (ref 5–15)
BUN: 12 mg/dL (ref 6–20)
CALCIUM: 8.8 mg/dL — AB (ref 8.9–10.3)
CO2: 25 mmol/L (ref 22–32)
CREATININE: 0.64 mg/dL (ref 0.44–1.00)
Chloride: 109 mmol/L (ref 101–111)
GFR calc non Af Amer: >60 mL/min (ref >60)
Glucose, Bld: 91 mg/dL (ref 65–99)
Potassium: 3.8 mmol/L (ref 3.5–5.1)
SODIUM: 140 mmol/L (ref 135–145)

## 2016-11-07 LAB — CBC
HCT: 35.5 % (ref 35.0–47.0)
Hemoglobin: 12.2 g/dL (ref 12.0–16.0)
MCH: 30.1 pg (ref 26.0–34.0)
MCHC: 34.4 g/dL (ref 32.0–36.0)
MCV: 87.6 fL (ref 80.0–100.0)
PLATELETS: 294 10*3/uL (ref 150–440)
RBC: 4.05 MIL/uL (ref 3.80–5.20)
RDW: 14.2 % (ref 11.5–14.5)
WBC: 6.7 10*3/uL (ref 3.6–11.0)

## 2016-11-07 LAB — TROPONIN I: Troponin I: <0.03 ng/mL (ref <0.03)

## 2016-11-07 MED ORDER — LISINOPRIL 20 MG PO TABS: 20.0000 mg | ORAL_TABLET | Freq: Two times a day (BID) | ORAL | Status: DC

## 2016-11-07 MED ORDER — CHOLESTYRAMINE LIGHT 4 G PO PACK
4.0000 g | PACK | Freq: Three times a day (TID) | ORAL | Status: DC
  Filled 2016-11-07: qty 1

## 2016-11-07 MED ORDER — HYDROCODONE-ACETAMINOPHEN 5-325 MG PO TABS
1.0000 | ORAL_TABLET | ORAL | Status: DC | PRN
  Administered 2016-11-07 (×2): 1 via ORAL
  Filled 2016-11-07 (×2): qty 1

## 2016-11-07 MED ORDER — HYDRALAZINE HCL 10 MG PO TABS
10.0000 mg | ORAL_TABLET | Freq: Three times a day (TID) | ORAL | Status: DC
  Administered 2016-11-07 (×2): 10 mg via ORAL
  Filled 2016-11-07 (×2): qty 1

## 2016-11-07 MED ORDER — APIXABAN 5 MG PO TABS
5.0000 mg | ORAL_TABLET | Freq: Two times a day (BID) | ORAL | Status: DC
  Administered 2016-11-07: 5 mg via ORAL
  Filled 2016-11-07: qty 1

## 2016-11-07 MED ORDER — ONDANSETRON HCL 4 MG PO TABS: 4.0000 mg | ORAL_TABLET | Freq: Four times a day (QID) | ORAL | Status: DC | PRN

## 2016-11-07 MED ORDER — CHOLESTYRAMINE LIGHT 4 G PO PACK
4.0000 g | PACK | Freq: Once | ORAL | Status: AC
  Administered 2016-11-07: 4 g via ORAL
  Filled 2016-11-07: qty 1

## 2016-11-07 MED ORDER — LISINOPRIL 20 MG PO TABS
60.0000 mg | ORAL_TABLET | Freq: Every day | ORAL | Status: DC
  Administered 2016-11-07: 60 mg via ORAL
  Filled 2016-11-07: qty 3

## 2016-11-07 MED ORDER — CHOLESTYRAMINE LIGHT 4 G PO PACK
4.0000 g | PACK | Freq: Every day | ORAL | Status: DC
  Filled 2016-11-07: qty 1

## 2016-11-07 MED ORDER — ONDANSETRON HCL 4 MG/2ML IJ SOLN: 4.0000 mg | Freq: Four times a day (QID) | INTRAMUSCULAR | Status: DC | PRN

## 2016-11-07 MED ORDER — ACETAMINOPHEN 325 MG PO TABS: 650.0000 mg | ORAL_TABLET | Freq: Four times a day (QID) | ORAL | Status: DC | PRN

## 2016-11-07 MED ORDER — ACETAMINOPHEN 650 MG RE SUPP: 650.0000 mg | Freq: Four times a day (QID) | RECTAL | Status: DC | PRN

## 2016-11-07 MED ORDER — SOTALOL HCL 80 MG PO TABS
80.0000 mg | ORAL_TABLET | Freq: Two times a day (BID) | ORAL | Status: DC
  Administered 2016-11-07: 80 mg via ORAL
  Filled 2016-11-07 (×3): qty 1

## 2016-11-07 MED ORDER — ALBUTEROL SULFATE (2.5 MG/3ML) 0.083% IN NEBU
2.5000 mg | INHALATION_SOLUTION | RESPIRATORY_TRACT | Status: DC | PRN
  Filled 2016-11-07: qty 3

## 2016-11-07 MED ORDER — PANTOPRAZOLE SODIUM 40 MG PO TBEC
40.0000 mg | DELAYED_RELEASE_TABLET | Freq: Every day | ORAL | Status: DC
  Administered 2016-11-07: 40 mg via ORAL
  Filled 2016-11-07: qty 1

## 2016-11-07 MED ORDER — NITROGLYCERIN 2 % TD OINT: 0.5000 [in_us] | TOPICAL_OINTMENT | Freq: Once | TRANSDERMAL | Status: DC

## 2016-11-07 NOTE — Care Management (Signed)
Provided list of agencies that can provide companion services, [employer] information, and contact for [employer]

## 2016-11-07 NOTE — Care Management (Signed)
CM screen due to age. Patient placed in observation with chest pain.  Troponins are negative. Cardiology has consulted and no further cardiac work up anticipated.  Patient presents from home and independent in all adls.  No issues accessing medical care, obtaining medications, maintaining housing, utilities and food.   No discharge needs identified at present time.

## 2016-11-07 NOTE — Discharge Summary (Signed)
[facility] NAME: Chelsey Williams    MR#:  [id number]  DATE OF BIRTH:  1927/11/22  DATE OF ADMISSION:  11/06/2016 ADMITTING PHYSICIAN: Lance Coon, MD  DATE OF DISCHARGE: 11/07/2016  PRIMARY CARE PHYSICIAN: Clarisse Gouge, MD    ADMISSION DIAGNOSIS:  Precordial pain [R07.2]  DISCHARGE DIAGNOSIS:  Principal Problem:   Atypical chest pain Active Problems:   Coronary artery disease, non-occlusive   Essential hypertension   HLD (hyperlipidemia)   Labile hypertension   Anxiety   SECONDARY DIAGNOSIS:   Past Medical History:  Diagnosis Date  . Anxiety   . Bilateral hearing loss   . Coronary artery disease, non-occlusive    a. LHC 11/2014: R dom, LM nl, mLAD 30-40, dLAD minor irregs, LCx minor irregs, RCA minor irregs w/ associated ectasia  . Depression   . GERD (gastroesophageal reflux disease)   . Glaucoma   . Hyperlipemia   . Hypertension   . Macular degeneration   . OA (osteoarthritis)   . Pacemaker    a. Medtronic Adapta ADDRL1 dual-chamber pacemaker (serial number [id number]) with Medtronic 9735 atrial lead (serial number [id number]) and Medtronic 5076 ventricular lead (serial number [id number]), all implanted 12/08/2014 by Dr. Jacklynn Barnacle at [facility] [phone].    Marland Kitchen Persistent atrial fibrillation ([place])    a. s/p DCCV 02/2016; b. sotalol and Eliquis; c. CHADS2VASc --> 4 (HTN, age x 2, female)  . Sinus node dysfunction [facility])     HOSPITAL COURSE:   83 year old female with past medical history of anxiety, history of coronary artery disease, depression, GERD, glaucoma, hypertension, hyperlipidemia, status post pacemaker placement, osteoarthritis, chronic atrial fibrillation who presented to the hospital due to chest pain.   1. Chest pain-patient's chest pain was quite atypical in nature and likely musculoskeletal in nature. She was observed overnight telemetry, her cardiac markers did not trend upwards. She was seen by  cardiology who did not recommend any further intervention. She had a recent nuclear medicine stress test in April of this year which was negative for acute ischemia. -She is being discharged home with close follow-up with her primary care physician and cardiologist.  2. History of chronic atrial fibrillation-patient remained rate controlled. She will continue her sotalol and her Eliquis.  3. Osteoarthritis-patient will continue her ibuprofen as needed. Next  4. GERD-patient will resume her omeprazole.  DISCHARGE CONDITIONS:   Stable  CONSULTS OBTAINED:  Treatment Team:  Nelva Bush, MD  DRUG ALLERGIES:   Allergies  Allergen Reactions  . Cephalexin Anaphylaxis    Shock / Unconsciousness  . Diltiazem Hcl Swelling  . Hydrochlorothiazide Other (See Comments)    dizziness  . Prednisone Shortness Of Breath    SOB  . Felodipine Nausea Only    Gastrointestinal problems, e.g., nausea, vomiting, diarrheaShock / Unconsciousnessmuscle tenderness  . Statins Other (See Comments)  . Amlodipine Other (See Comments) and Palpitations  . Atenolol Palpitations    heart palpitations    DISCHARGE MEDICATIONS:   Allergies as of 11/07/2016      Reactions   Cephalexin Anaphylaxis   Shock / Unconsciousness   Diltiazem Hcl Swelling   Hydrochlorothiazide Other (See Comments)   dizziness   Prednisone Shortness Of Breath   SOB   Felodipine Nausea Only   Gastrointestinal problems, e.g., nausea, vomiting, diarrheaShock / Unconsciousnessmuscle tenderness   Statins Other (See Comments)   Amlodipine Other (See Comments), Palpitations   Atenolol Palpitations   heart palpitations      Medication List  TAKE these medications   albuterol 108 (90 Base) MCG/ACT inhaler Commonly known as:  PROVENTIL HFA;VENTOLIN HFA Inhale into the lungs.   ELIQUIS 5 MG Tabs tablet Generic drug:  apixaban Take 5 mg by mouth 2 (two) times daily.   hydrALAZINE 10 MG tablet Commonly known as:   APRESOLINE Take 1 tablet (10 mg total) by mouth 3 (three) times daily.   ibuprofen 200 MG tablet Commonly known as:  ADVIL,MOTRIN Take 200 mg by mouth every 6 (six) hours as needed for fever or mild pain.   lisinopril 20 MG tablet Commonly known as:  PRINIVIL,ZESTRIL Take 1 tablet (20 mg total) by mouth 2 (two) times daily. What changed:  how much to take  when to take this   omeprazole 20 MG capsule Commonly known as:  PRILOSEC Take 20 mg by mouth daily as needed.   sotalol 80 MG tablet Commonly known as:  BETAPACE Take 80 mg by mouth every 12 (twelve) hours.         DISCHARGE INSTRUCTIONS:   DIET:  Cardiac diet  DISCHARGE CONDITION:  Stable  ACTIVITY:  Activity as tolerated  OXYGEN:  Home Oxygen: No.   Oxygen Delivery: room air  DISCHARGE LOCATION:  home   If you experience worsening of your admission symptoms, develop shortness of breath, life threatening emergency, suicidal or homicidal thoughts you must seek medical attention immediately by calling 911 or calling your MD immediately  if symptoms less severe.  You Must read complete instructions/literature along with all the possible adverse reactions/side effects for all the Medicines you take and that have been prescribed to you. Take any new Medicines after you have completely understood and accpet all the possible adverse reactions/side effects.   Please note  You were cared for by a hospitalist during your hospital stay. If you have any questions about your discharge medications or the care you received while you were in the hospital after you are discharged, you can call the unit and asked to speak with the hospitalist on call if the hospitalist that took care of you is not available. Once you are discharged, your primary care physician will handle any further medical issues. Please note that NO REFILLS for any discharge medications will be authorized once you are discharged, as it is imperative that  you return to your primary care physician (or establish a relationship with a primary care physician if you do not have one) for your aftercare needs so that they can reassess your need for medications and monitor your lab values.     Today   No chest pain/shortness of breath. Feels better. Hemodynamically stable. Will d/c home today.   VITAL SIGNS:  Blood pressure 119/65, pulse 73, temperature 98 F (36.7 C), temperature source Oral, resp. rate 18, height 5' 4"  (1.626 m), weight 74.4 kg (164 lb), SpO2 97 %.  I/O:   Intake/Output Summary (Last 24 hours) at 11/07/16 1543 Last data filed at 11/07/16 1355  Gross per 24 hour  Intake             2040 ml  Output                0 ml  Net             2040 ml    PHYSICAL EXAMINATION:  GENERAL:  83 y.o.-year-old patient lying in the bed with no acute distress.  EYES: Pupils equal, round, reactive to light and accommodation. No scleral icterus. Extraocular muscles intact.  HEENT: Head atraumatic, normocephalic. Oropharynx and nasopharynx clear.  NECK:  Supple, no jugular venous distention. No thyroid enlargement, no tenderness.  LUNGS: Normal breath sounds bilaterally, no wheezing, rales,rhonchi. No use of accessory muscles of respiration.  CARDIOVASCULAR: S1, S2 normal. No murmurs, rubs, or gallops.  ABDOMEN: Soft, non-tender, non-distended. Bowel sounds present. No organomegaly or mass.  EXTREMITIES: No pedal edema, cyanosis, or clubbing.  NEUROLOGIC: Cranial nerves II through XII are intact. No focal motor or sensory defecits b/l.  PSYCHIATRIC: The patient is alert and oriented x 3.  SKIN: No obvious rash, lesion, or ulcer.   DATA REVIEW:   CBC  Recent Labs Lab 11/07/16 0832  WBC 6.7  HGB 12.2  HCT 35.5  PLT 294    Chemistries   Recent Labs Lab 11/07/16 0832  NA 140  K 3.8  CL 109  CO2 25  GLUCOSE 91  BUN 12  CREATININE 0.64  CALCIUM 8.8*    Cardiac Enzymes  Recent Labs Lab 11/07/16 0832  TROPONINI <0.03      RADIOLOGY:  Dg Chest 2 View  Result Date: 11/06/2016 CLINICAL DATA:  83 y/o  F; chest pain and shortness of breath. EXAM: CHEST  2 VIEW COMPARISON:  08/15/2016 chest radiograph FINDINGS: Stable normal cardiac silhouette. Two lead pacemaker. Aortic atherosclerosis with calcification. Clear lungs. No pleural effusion or pneumothorax. Bones are unremarkable. IMPRESSION: No acute pulmonary process identified. Electronically Signed   By: Kristine Garbe M.D.   On: 11/06/2016 20:35   Ct Angio Chest Aorta W And/or Wo Contrast  Result Date: 11/06/2016 CLINICAL DATA:  83 year old female with chest pain and elevated blood pressure. EXAM: CT ANGIOGRAPHY CHEST, ABDOMEN AND PELVIS TECHNIQUE: Multidetector CT imaging through the chest, abdomen and pelvis was performed using the standard protocol during bolus administration of intravenous contrast. Multiplanar reconstructed images and MIPs were obtained and reviewed to evaluate the vascular anatomy. CONTRAST:  100 cc Isovue 370 COMPARISON:  Chest radiograph dated 11/06/2016 FINDINGS: CTA CHEST FINDINGS Cardiovascular: Top-normal cardiac size. There is coronary vascular calcification primarily involving the LAD and left circumflex artery. There is no pericardial effusion. Left pectoral pacemaker device noted. There is moderate atherosclerotic calcification of the thoracic aorta. The ascending aorta measures up to 4.2 cm in diameter. The origins of the great vessels of the aortic arch appear patent. There is no CT evidence of pulmonary artery embolus. Mediastinum/Nodes: There is no hilar or mediastinal adenopathy. Is a small hiatal hernia. The esophagus is grossly unremarkable. There is irregular and nodular appearance of the left thyroid gland. Lungs/Pleura: Left lung base linear atelectasis/ scarring. There are small scattered pneumatoceles. There is no focal consolidation, pleural effusion, or pneumothorax. There is a 3 mm right lower lobe subpleural  nodule (series 9, image 27). Punctate right lower lobe subpleural calcified granuloma. The central airways are patent. Musculoskeletal: The chest wall soft tissues appear unremarkable. Osteopenia with degenerative changes of the spine. No acute osseous pathology. Review of the MIP images confirms the above findings. CTA ABDOMEN AND PELVIS FINDINGS VASCULAR Aorta: Moderate atherosclerotic disease. No aneurysmal dilatation or dissection. Celiac: Patent without evidence of aneurysm, dissection, vasculitis or significant stenosis. Accessory left hepatic artery from the left gastric artery. SMA: Patent without evidence of aneurysm, dissection, vasculitis or significant stenosis. Renals: Both renal arteries are patent without evidence of aneurysm, dissection, vasculitis, fibromuscular dysplasia or significant stenosis. IMA: Patent without evidence of aneurysm, dissection, vasculitis or significant stenosis. Inflow: Atherosclerotic calcification of the iliac vasculature. No aneurysmal dilatation or evidence of dissection.  Veins: No obvious venous abnormality within the limitations of this arterial phase study. Review of the MIP images confirms the above findings. NON-VASCULAR No intra-abdominal free air or free fluid. Hepatobiliary: Cholecystectomy.  The liver is unremarkable. Pancreas: Unremarkable. No pancreatic ductal dilatation or surrounding inflammatory changes. Spleen: Normal in size without focal abnormality. Adrenals/Urinary Tract: There is a 13 mm indeterminate left adrenal nodule, possibly an adenoma. MRI may provide better characterization. The right adrenal gland is unremarkable. There are bilateral renal hypodense lesions measuring up to 11 mm in the interpolar aspect of the right kidney. The larger lesions demonstrate fluid attenuation most consistent with cysts. A subcentimeter left renal upper pole exophytic lesion is not well characterized but also likely represents a cyst. There is no hydronephrosis on  either side. The visualized ureters and urinary bladder appear unremarkable. Stomach/Bowel: Small hiatal hernia. There is mild thickening of a segment of distal small bowel in the right hemipelvis with associated mild inflammatory changes concerning for enteritis. There is mild dilatation of a loop of bowel proximal to this segment likely representing reactive ileus. There is no evidence of bowel obstruction. There is sigmoid diverticulosis without active inflammatory changes. There is moderate stool throughout the colon. Normal appendix. Lymphatic: No adenopathy. Reproductive: The uterus and the ovaries are grossly unremarkable as visualized. Other: None Musculoskeletal: Osteopenia multilevel degenerative changes of the spine with sclerotic changes of the lumbar vertebra. Multilevel disc desiccation with vacuum phenomena. No acute osseous pathology. Review of the MIP images confirms the above findings. IMPRESSION: 1. No CT evidence of aortic aneurysm for dissection. Mild prominence of the ascending aorta measuring up to 4.2 cm in maximal diameter. Recommend annual imaging followup by CTA or MRA. This recommendation follows 2010 ACCF/AHA/AATS/ACR/ASA/SCA/SCAI/SIR/STS/SVM Guidelines for the Diagnosis and Management of Patients with Thoracic Aortic Disease. Circulation. 2010; 121: Y195-K932 2. No CT evidence of pulmonary artery embolus. 3. Segmental enteritis of distal small bowel in the right hemipelvis with associated mild ileus. No bowel obstruction. Normal appendix. 4. Colonic diverticulosis.  No bowel obstruction.  Normal appendix. 5.  Aortic Atherosclerosis (ICD10-I70.0). Electronically Signed   By: Anner Crete M.D.   On: 11/06/2016 23:30   Ct Angio Abd/pel W And/or Wo Contrast  Result Date: 11/06/2016 CLINICAL DATA:  83 year old female with chest pain and elevated blood pressure. EXAM: CT ANGIOGRAPHY CHEST, ABDOMEN AND PELVIS TECHNIQUE: Multidetector CT imaging through the chest, abdomen and pelvis was  performed using the standard protocol during bolus administration of intravenous contrast. Multiplanar reconstructed images and MIPs were obtained and reviewed to evaluate the vascular anatomy. CONTRAST:  100 cc Isovue 370 COMPARISON:  Chest radiograph dated 11/06/2016 FINDINGS: CTA CHEST FINDINGS Cardiovascular: Top-normal cardiac size. There is coronary vascular calcification primarily involving the LAD and left circumflex artery. There is no pericardial effusion. Left pectoral pacemaker device noted. There is moderate atherosclerotic calcification of the thoracic aorta. The ascending aorta measures up to 4.2 cm in diameter. The origins of the great vessels of the aortic arch appear patent. There is no CT evidence of pulmonary artery embolus. Mediastinum/Nodes: There is no hilar or mediastinal adenopathy. Is a small hiatal hernia. The esophagus is grossly unremarkable. There is irregular and nodular appearance of the left thyroid gland. Lungs/Pleura: Left lung base linear atelectasis/ scarring. There are small scattered pneumatoceles. There is no focal consolidation, pleural effusion, or pneumothorax. There is a 3 mm right lower lobe subpleural nodule (series 9, image 27). Punctate right lower lobe subpleural calcified granuloma. The central airways are patent. Musculoskeletal: The  chest wall soft tissues appear unremarkable. Osteopenia with degenerative changes of the spine. No acute osseous pathology. Review of the MIP images confirms the above findings. CTA ABDOMEN AND PELVIS FINDINGS VASCULAR Aorta: Moderate atherosclerotic disease. No aneurysmal dilatation or dissection. Celiac: Patent without evidence of aneurysm, dissection, vasculitis or significant stenosis. Accessory left hepatic artery from the left gastric artery. SMA: Patent without evidence of aneurysm, dissection, vasculitis or significant stenosis. Renals: Both renal arteries are patent without evidence of aneurysm, dissection, vasculitis,  fibromuscular dysplasia or significant stenosis. IMA: Patent without evidence of aneurysm, dissection, vasculitis or significant stenosis. Inflow: Atherosclerotic calcification of the iliac vasculature. No aneurysmal dilatation or evidence of dissection. Veins: No obvious venous abnormality within the limitations of this arterial phase study. Review of the MIP images confirms the above findings. NON-VASCULAR No intra-abdominal free air or free fluid. Hepatobiliary: Cholecystectomy.  The liver is unremarkable. Pancreas: Unremarkable. No pancreatic ductal dilatation or surrounding inflammatory changes. Spleen: Normal in size without focal abnormality. Adrenals/Urinary Tract: There is a 13 mm indeterminate left adrenal nodule, possibly an adenoma. MRI may provide better characterization. The right adrenal gland is unremarkable. There are bilateral renal hypodense lesions measuring up to 11 mm in the interpolar aspect of the right kidney. The larger lesions demonstrate fluid attenuation most consistent with cysts. A subcentimeter left renal upper pole exophytic lesion is not well characterized but also likely represents a cyst. There is no hydronephrosis on either side. The visualized ureters and urinary bladder appear unremarkable. Stomach/Bowel: Small hiatal hernia. There is mild thickening of a segment of distal small bowel in the right hemipelvis with associated mild inflammatory changes concerning for enteritis. There is mild dilatation of a loop of bowel proximal to this segment likely representing reactive ileus. There is no evidence of bowel obstruction. There is sigmoid diverticulosis without active inflammatory changes. There is moderate stool throughout the colon. Normal appendix. Lymphatic: No adenopathy. Reproductive: The uterus and the ovaries are grossly unremarkable as visualized. Other: None Musculoskeletal: Osteopenia multilevel degenerative changes of the spine with sclerotic changes of the lumbar  vertebra. Multilevel disc desiccation with vacuum phenomena. No acute osseous pathology. Review of the MIP images confirms the above findings. IMPRESSION: 1. No CT evidence of aortic aneurysm for dissection. Mild prominence of the ascending aorta measuring up to 4.2 cm in maximal diameter. Recommend annual imaging followup by CTA or MRA. This recommendation follows 2010 ACCF/AHA/AATS/ACR/ASA/SCA/SCAI/SIR/STS/SVM Guidelines for the Diagnosis and Management of Patients with Thoracic Aortic Disease. Circulation. 2010; 121: I786-V672 2. No CT evidence of pulmonary artery embolus. 3. Segmental enteritis of distal small bowel in the right hemipelvis with associated mild ileus. No bowel obstruction. Normal appendix. 4. Colonic diverticulosis.  No bowel obstruction.  Normal appendix. 5.  Aortic Atherosclerosis (ICD10-I70.0). Electronically Signed   By: Anner Crete M.D.   On: 11/06/2016 23:30      Management plans discussed with the patient, family and they are in agreement.  CODE STATUS:     Code Status Orders        Start     Ordered   11/07/16 0229  Full code  Continuous     11/07/16 0228    Code Status History    Date Active Date Inactive Code Status Order ID Comments User Context   08/15/2016  6:17 PM 08/16/2016  9:05 PM Full Code [id number]  Dustin Flock, MD Inpatient    Advance Directive Documentation     Most Recent Value  Type of Advance Directive  Healthcare Power of  Attorney  Pre-existing out of facility DNR order (yellow form or pink MOST form)  -  "MOST" Form in Place?  -      TOTAL TIME TAKING CARE OF THIS PATIENT: 40 minutes.    Henreitta Leber M.D on 11/07/2016 at 3:43 PM  Between 7am to 6pm - Pager - [phone]  After 6pm go to [email or web address] - [employer] [facility]  Office  [phone]  CC: Primary care physician; Clarisse Gouge, MD

## 2016-11-07 NOTE — Progress Notes (Signed)
A&O. Up with one assist. Vicodin given for back pain which is chronic. Cardiology consult for today.

## 2016-11-07 NOTE — Care Management Obs Status (Signed)
[facility] NOTIFICATION   Patient Details  Name: Chelsey Williams MRN: [id number] Date of Birth: 08-Nov-1927   Medicare Observation Status Notification Given:  No < 24 hours   Katrina Stack, RN 11/07/2016, 12:22 PM

## 2016-11-07 NOTE — Plan of Care (Signed)
Problem: Safety: Goal: Ability to remain free from injury will improve Outcome: Progressing Pt is a moderate fall risk, pt encouraged to call for assistance with activity.

## 2016-11-07 NOTE — Consult Note (Signed)
Cardiology Consultation Note  Patient ID: Malika Demario, MRN: [id number], DOB/AGE: March 15, 1928 83 y.o. Admit date: 11/06/2016   Date of Consult: 11/07/2016 Primary Physician: Clarisse Gouge, MD Primary Cardiologist: Dr. Omelia Blackwater, MD ([facility]) Requesting Physician: Dr. Jannifer Franklin, MD  Chief Complaint: Chest pain Reason for Consult: Atypical chest pain  HPI: Alnita Aybar is a 83 y.o. female who is being seen today for the evaluation of atypical chest pain at the request of Dr. Jannifer Franklin, MD. Patient has a h/o nonobstructive CAD by [facility] 12/07/2014 detailed below, persistent Afib s/p successful TEE/DCCV 02/2016 on sotalol and Eliquis, sinus node dysfunction s/p MDT PPM in [place], [place] implanted 05/6710, diastolic dysfunction, essential hypertension, HLD, anxiety/depression, OA, macular degeneration, and GERD who presented to [facility] with non-exertional chest pain that has been unchanged since mid March 2018  Patient was previously followed in [place], [place] though recently moved to [facility] given her health and advanced age to live with her son. She has since been followed by [facility] EP. In mid March 2018 device interrogation showed no significant Afib. Device LRL was increased to 70 and AAI-DDDR mode switching activated to favor intrinsic AV conduction when possible. The patient believes her symptoms of exertional chest pain, palpitations, and dizziness all started after this device change. She notes "every time" she ambulates she develops substernal chest pain with associated palpitations and dizziness. Upon resting, these symptoms resolve. No associated nausea, vomiting, diaphoresis, presyncope, or syncope. She also notes when she gets worried/anxious her BP increases which leads to similar symptoms. She prefers for her BP to be in the 458 systolic range, stating any lower than that she becomes fatigued. Patient contacted her [facility] EP MD on 4/24 with the above complaints who advised patient to proceed to the ED.   She was  admitted in 07/2016 for atypical chest pain that was reproducible to palpation on exam. She underwent nuclear stress test that was low risk. Pain was not felt ot be cardiac in etiology given recent nonobstructive LHC and normal nuclear stress test. She has continued to note chest pain that is fully reproducible to palpation and is "very nervous" about her pain. Since her recent admission in 07/2016 she has noted fluctuations in her BP from the 099I to 33A systolic. These fluctuations started after being initiated on hydralazine. She reports noncompliance with her medications as " I do not like medications." She was admitted with atypical chest pain. Has ruled out. CTA chest without evidence of aortic dissection or PE. Mildly dilated aorta noted at 4.2 cm. Vitals stable. Currently notes severe low back pain and no chest pain.     Past Medical History:  Diagnosis Date  . Anxiety   . Bilateral hearing loss   . Coronary artery disease, non-occlusive    a. LHC 11/2014: R dom, LM nl, mLAD 30-40, dLAD minor irregs, LCx minor irregs, RCA minor irregs w/ associated ectasia  . Depression   . GERD (gastroesophageal reflux disease)   . Glaucoma   . Hyperlipemia   . Hypertension   . Macular degeneration   . OA (osteoarthritis)   . Pacemaker    a. Medtronic Adapta ADDRL1 dual-chamber pacemaker (serial number [id number]) with Medtronic 2505 atrial lead (serial number [id number]) and Medtronic 5076 ventricular lead (serial number [id number]), all implanted 12/08/2014 by Dr. Jacklynn Barnacle at [facility] [phone].    Marland Kitchen Persistent atrial fibrillation ([place])    a. s/p DCCV 02/2016; b. sotalol and Eliquis; c. CHADS2VASc --> 4 (HTN, age x 2, female)  . Sinus  node dysfunction ([place])       Most Recent Cardiac Studies: LHC 12/07/2014: Coronary Angiography: 1. Right dominant circulation.  2. The left main gives rise to the LAD and left circumflex. The left main is free of stenosis.  3. The LAD is a large vessel that  gives rise to one large bifurcating diagonal branch before continuing to wrap the apex. The LAD has a 30-40% stenosis in mid-vessel just after the origin of the diagonal branch with associated myocardial bridging. The LAD otherwise has mild luminal disease with significant distal tortuosity. 4. The left circumflex is a large vessel that gives rise to two large obtuse marginal branches. The circumflex and its branches have only mild luminal disease.  5. The RCA is a large caliber dominant vessel that distally gives rise to the PDA and a large bifurcating posterolateral branch. The RCA has mild luminal disease with associated ectasia in its mid and distal segments.   Nuclear stress test 07/2016: Pharmacological myocardial perfusion imaging study with no significant  ischemia Normal wall motion, EF estimated at 69% No EKG changes concerning for ischemia at peak stress or in recovery. Low risk scan  TEE 02/2016: CONCLUSIONS ------------------------------------------------------------------ 1) NO LAA THROMBUS OR MASS 2) MILD MR, MILD AR, MODERATE TR, TRIVIAL PR 3) GLOBALLY NORMAL SYSTOLIC FUNCTION   Surgical History:  Past Surgical History:  Procedure Laterality Date  . CHOLECYSTECTOMY    . knee replacmen    . PACEMAKER IMPLANT    . throid node       Home Meds: Prior to Admission medications   Medication Sig Start Date End Date Taking? Authorizing Provider  albuterol (PROVENTIL HFA;VENTOLIN HFA) 108 (90 Base) MCG/ACT inhaler Inhale into the lungs. 02/21/16 02/20/17 Yes [provider]  apixaban (ELIQUIS) 5 MG TABS tablet Take 5 mg by mouth 2 (two) times daily.  04/06/16  Yes [provider]  hydrALAZINE (APRESOLINE) 10 MG tablet Take 1 tablet (10 mg total) by mouth 3 (three) times daily. 08/16/16  Yes Vaughan Basta, MD  ibuprofen (ADVIL,MOTRIN) 200 MG tablet Take 200 mg by mouth every 6 (six) hours as needed for fever or mild pain.    Yes [provider]  lisinopril (PRINIVIL,ZESTRIL) 20 MG tablet Take 60 mg by mouth daily.    Yes [provider]  omeprazole (PRILOSEC) 20 MG capsule Take 20 mg by mouth daily as needed.    Yes [provider]  sotalol (BETAPACE) 80 MG tablet Take 80 mg by mouth every 12 (twelve) hours. 03/24/16  Yes [provider]    Inpatient Medications:  . apixaban  5 mg Oral BID  . cholestyramine light  4 g Oral TID  . hydrALAZINE  10 mg Oral TID  . lisinopril  60 mg Oral Daily  . nitroGLYCERIN  0.5 inch Topical Once  . pantoprazole  40 mg Oral Daily  . sotalol  80 mg Oral Q12H     Allergies:  Allergies  Allergen Reactions  . Cephalexin Anaphylaxis    Shock / Unconsciousness  . Diltiazem Hcl Swelling  . Hydrochlorothiazide Other (See Comments)    dizziness  . Prednisone Shortness Of Breath    SOB  . Felodipine Nausea Only    Gastrointestinal problems, e.g., nausea, vomiting, diarrheaShock / Unconsciousnessmuscle tenderness  . Statins Other (See Comments)  . Amlodipine Other (See Comments) and Palpitations  . Atenolol Palpitations    heart palpitations    Social History   Social History  . Marital status: Widowed  Spouse name: N/A  . Number of children: N/A  . Years of education: N/A   Occupational History  . Not on file.   Social History Main Topics  . Smoking status: Never Smoker  . Smokeless tobacco: Never Used  . Alcohol use No  . Drug use: No  . Sexual activity: Not on file   Other Topics Concern  . Not on file   Social History Narrative  . No narrative on file     Family History  Problem Relation Age of Onset  . Hypertension Mother      Review of Systems: Review of Systems  Constitutional: Positive for malaise/fatigue. Negative for chills, diaphoresis, fever and weight loss.  HENT: Negative for congestion.   Eyes: Negative for discharge and redness.  Respiratory: Negative for cough, hemoptysis, sputum production, shortness of  breath and wheezing.   Cardiovascular: Positive for chest pain. Negative for palpitations, orthopnea, claudication, leg swelling and PND.  Gastrointestinal: Negative for abdominal pain, blood in stool, heartburn, melena, nausea and vomiting.  Genitourinary: Negative for hematuria.  Musculoskeletal: Negative for falls and myalgias.  Skin: Negative for rash.  Neurological: Positive for weakness. Negative for dizziness, tingling, tremors, sensory change, speech change, focal weakness and loss of consciousness.  Endo/Heme/Allergies: Does not bruise/bleed easily.  Psychiatric/Behavioral: Negative for substance abuse. The patient is nervous/anxious.   All other systems reviewed and are negative.   Labs:  Recent Labs  11/06/16 2004 11/07/16 0246  TROPONINI <0.03 <0.03   Lab Results  Component Value Date   WBC 9.2 11/06/2016   HGB 13.1 11/06/2016   HCT 39.3 11/06/2016   MCV 88.4 11/06/2016   PLT 340 11/06/2016     Recent Labs Lab 11/06/16 2004  NA 136  K 3.9  CL 103  CO2 25  BUN 15  CREATININE 0.74  CALCIUM 9.5  GLUCOSE 102*   No results found for: CHOL, HDL, LDLCALC, TRIG No results found for: DDIMER  Radiology/Studies:  Dg Chest 2 View  Result Date: 11/06/2016 IMPRESSION: No acute pulmonary process identified. Electronically Signed   By: Kristine Garbe M.D.   On: 11/06/2016 20:35     Ct Angio Abd/pel W And/or Wo Contrast  Result Date: 11/06/2016 IMPRESSION: 1. No CT evidence of aortic aneurysm for dissection. Mild prominence of the ascending aorta measuring up to 4.2 cm in maximal diameter. Recommend annual imaging followup by CTA or MRA. This recommendation follows 2010 ACCF/AHA/AATS/ACR/ASA/SCA/SCAI/SIR/STS/SVM Guidelines for the Diagnosis and Management of Patients with Thoracic Aortic Disease. Circulation. 2010; 121: W413-K440 2. No CT evidence of pulmonary artery embolus. 3. Segmental enteritis of distal small bowel in the right hemipelvis with associated  mild ileus. No bowel obstruction. Normal appendix. 4. Colonic diverticulosis.  No bowel obstruction.  Normal appendix. 5.  Aortic Atherosclerosis (ICD10-I70.0). Electronically Signed   By: Anner Crete M.D.   On: 11/06/2016 23:30    EKG: Interpreted by me showed: A paced, 75 bpm Telemetry: Interpreted by me showed: Paced  Weights: Filed Weights   11/06/16 1957  Weight: 164 lb (74.4 kg)     Physical Exam: Blood pressure (!) 149/87, pulse 74, temperature 98 F (36.7 C), temperature source Oral, resp. rate 18, height 5' 4"  (1.626 m), weight 164 lb (74.4 kg), SpO2 100 %. Body mass index is 28.15 kg/m. General: Well developed, well nourished, in no acute distress. Head: Normocephalic, atraumatic, sclera non-icteric, no xanthomas, nares are without discharge.  Neck: Negative for carotid bruits. JVD not elevated. Lungs: Clear bilaterally to auscultation  without wheezes, rales, or rhonchi. Breathing is unlabored. Heart: RRR with S1 S2. No murmurs, rubs, or gallops appreciated. Anterior chest wall pain fully reproducible to palpation on exam.  Abdomen: Soft, non-tender, non-distended with normoactive bowel sounds. No hepatomegaly. No rebound/guarding. No obvious abdominal masses. Msk:  Strength and tone appear normal for age. Extremities: No clubbing or cyanosis. No edema. Distal pedal pulses are 2+ and equal bilaterally. Neuro: Alert and oriented X 3. No facial asymmetry. No focal deficit. Moves all extremities spontaneously. Psych:  Responds to questions appropriately with a normal affect.    Assessment and Plan:  Principal Problem:   Atypical chest pain Active Problems:   Essential hypertension   Labile hypertension   Anxiety   Coronary artery disease, non-occlusive   HLD (hyperlipidemia)    1. Atypical chest pain: -Pain is fully reproducible to palpation on exam -She notes severe anxiety and labile BP -Recent LHC that showed nonobstructive disease in 2016 and recent nuclear  stress test in 07/2016 that was normal -No plans for inpatient ischemic workup given the reproducibility on her pain -Suggest IM evaluate for non-cardiac etiology  2. Labile HTN: -Notes BP fluctuations from the 803O to 12Y systolic -Does not take medications as directed as "I do not like medications" -Her noncompliance is likely playing a large role in her BOP fluctuations   3. Anxiety: -Previously on Librium -Per IM  4. Palpitations/dizziness: -Resolved  5. Persistent Afib: -S/p TEE/DCCV 02/2016: -Device interrogation without any evidence of significant atrial arrhythmia -Sotalol  -Eliquis -CHADS2VASc 4 (HTN, age x 2, female)  6. Sinus node dysfunction: -Device appears to be functioning well -Has EP follow up on 7/24  7. Nonobstructive CAD: -Ischemia workup as above -On Eliquis in place of ASA  8. Dilated thoracic aorta: -CTA as above -Check echo and compare to prior study in Care Everywhere  9. Low back pain: -Her pain along with her medication noncompliance are likely playing a role in her BP fluctuations    Signed, Christell Faith, PA-C [place] Pager: [phone] 11/07/2016, 7:57 AM

## 2016-11-07 NOTE — H&P (Signed)
[facility] [place] NAME: Chelsey Williams    MR#:  [id number]  DATE OF BIRTH:  1928/02/12  DATE OF ADMISSION:  11/06/2016  PRIMARY CARE PHYSICIAN: Clarisse Gouge, MD   REQUESTING/REFERRING PHYSICIAN: Joni Fears, MD  CHIEF COMPLAINT:   Chief Complaint  Patient presents with  . Chest Pain  . Hypertension  . Nausea    HISTORY OF PRESENT ILLNESS:  Chelsey Williams  is a 83 y.o. female who presents with Intermittent episodes of chest pain. Patient also states that she has been having significantly fluctuating blood pressure lately. Initial workup in the ED was within normal limits, but given the patient's risk factors and persistence of pain hospitals were called for admission and further evaluation.  PAST MEDICAL HISTORY:   Past Medical History:  Diagnosis Date  . Anxiety   . Bilateral hearing loss   . Coronary artery disease, non-occlusive    a. LHC 11/2014: R dom, LM nl, mLAD 30-40, dLAD minor irregs, LCx minor irregs, RCA minor irregs w/ associated ectasia  . Depression   . GERD (gastroesophageal reflux disease)   . Glaucoma   . Hyperlipemia   . Hypertension   . Macular degeneration   . OA (osteoarthritis)   . Pacemaker    a. Medtronic Adapta ADDRL1 dual-chamber pacemaker (serial number [id number]) with Medtronic 0923 atrial lead (serial number [id number]) and Medtronic 5076 ventricular lead (serial number [id number]), all implanted 12/08/2014 by Dr. Jacklynn Barnacle at [facility] [phone].    Marland Kitchen Persistent atrial fibrillation ([place])    a. s/p DCCV 02/2016; b. sotalol and Eliquis; c. CHADS2VASc --> 4 (HTN, age x 2, female)  . Sinus node dysfunction ([facility])     PAST SURGICAL HISTORY:   Past Surgical History:  Procedure Laterality Date  . CHOLECYSTECTOMY    . knee replacmen    . PACEMAKER IMPLANT    . throid node      SOCIAL HISTORY:   Social History  Substance Use Topics  . Smoking status: Never Smoker  . Smokeless tobacco:  Never Used  . Alcohol use No    FAMILY HISTORY:   Family History  Problem Relation Age of Onset  . Hypertension Mother     DRUG ALLERGIES:   Allergies  Allergen Reactions  . Cephalexin Anaphylaxis    Shock / Unconsciousness  . Diltiazem Hcl Swelling  . Hydrochlorothiazide Other (See Comments)    dizziness  . Prednisone Shortness Of Breath    SOB  . Felodipine Nausea Only    Gastrointestinal problems, e.g., nausea, vomiting, diarrheaShock / Unconsciousnessmuscle tenderness  . Statins Other (See Comments)  . Amlodipine Other (See Comments) and Palpitations  . Atenolol Palpitations    heart palpitations    MEDICATIONS AT HOME:   Prior to Admission medications   Medication Sig Start Date End Date Taking? Authorizing Provider  albuterol (PROVENTIL HFA;VENTOLIN HFA) 108 (90 Base) MCG/ACT inhaler Inhale into the lungs. 02/21/16 02/20/17 Yes [provider]  apixaban (ELIQUIS) 5 MG TABS tablet Take 5 mg by mouth 2 (two) times daily.  04/06/16  Yes [provider]  hydrALAZINE (APRESOLINE) 10 MG tablet Take 1 tablet (10 mg total) by mouth 3 (three) times daily. 08/16/16  Yes Vaughan Basta, MD  ibuprofen (ADVIL,MOTRIN) 200 MG tablet Take 200 mg by mouth every 6 (six) hours as needed for fever or mild pain.    Yes [provider]  lisinopril (PRINIVIL,ZESTRIL) 20 MG tablet Take 60 mg by mouth daily.  Yes [provider]  omeprazole (PRILOSEC) 20 MG capsule Take 20 mg by mouth daily as needed.    Yes [provider]  sotalol (BETAPACE) 80 MG tablet Take 80 mg by mouth every 12 (twelve) hours. 03/24/16  Yes [provider]    REVIEW OF SYSTEMS:  Review of Systems  Constitutional: Negative for chills, fever, malaise/fatigue and weight loss.  HENT: Negative for ear pain, hearing loss and tinnitus.   Eyes: Negative for blurred vision, double vision, pain and redness.  Respiratory: Positive for shortness of breath. Negative  for cough and hemoptysis.   Cardiovascular: Positive for chest pain. Negative for palpitations, orthopnea and leg swelling.  Gastrointestinal: Negative for abdominal pain, constipation, diarrhea, nausea and vomiting.  Genitourinary: Negative for dysuria, frequency and hematuria.  Musculoskeletal: Negative for back pain, joint pain and neck pain.  Skin:       No acne, rash, or lesions  Neurological: Negative for dizziness, tremors, focal weakness and weakness.  Endo/Heme/Allergies: Negative for polydipsia. Does not bruise/bleed easily.  Psychiatric/Behavioral: Negative for depression. The patient is not nervous/anxious and does not have insomnia.      VITAL SIGNS:   Vitals:   11/06/16 1957 11/06/16 2159 11/06/16 2200  BP:  (!) 137/108 (!) 155/109  Pulse:  78 75  Resp:  18 14  SpO2:  99% 99%  Weight: 74.4 kg (164 lb)    Height: 5' 4"  (1.626 m)     Wt Readings from Last 3 Encounters:  11/06/16 74.4 kg (164 lb)  08/15/16 74.4 kg (164 lb)    PHYSICAL EXAMINATION:  Physical Exam  Vitals reviewed. Constitutional: She is oriented to person, place, and time. She appears well-developed and well-nourished. No distress.  HENT:  Head: Normocephalic and atraumatic.  Mouth/Throat: Oropharynx is clear and moist.  Eyes: Pupils are equal, round, and reactive to light. Conjunctivae and EOM are normal. No scleral icterus.  Neck: Normal range of motion. Neck supple. No JVD present. No thyromegaly present.  Cardiovascular: Normal rate, regular rhythm and intact distal pulses.  Exam reveals no gallop and no friction rub.   No murmur heard. Respiratory: Effort normal and breath sounds normal. No respiratory distress. She has no wheezes. She has no rales.  GI: Soft. Bowel sounds are normal. She exhibits no distension. There is no tenderness.  Musculoskeletal: Normal range of motion. She exhibits no edema.  No arthritis, no gout  Lymphadenopathy:    She has no cervical adenopathy.  Neurological:  She is alert and oriented to person, place, and time. No cranial nerve deficit.  No dysarthria, no aphasia  Skin: Skin is warm and dry. No rash noted. No erythema.  Psychiatric: She has a normal mood and affect. Her behavior is normal. Judgment and thought content normal.    LABORATORY PANEL:   CBC  Recent Labs Lab 11/06/16 2004  WBC 9.2  HGB 13.1  HCT 39.3  PLT 340   ------------------------------------------------------------------------------------------------------------------  Chemistries   Recent Labs Lab 11/06/16 2004  NA 136  K 3.9  CL 103  CO2 25  GLUCOSE 102*  BUN 15  CREATININE 0.74  CALCIUM 9.5   ------------------------------------------------------------------------------------------------------------------  Cardiac Enzymes  Recent Labs Lab 11/06/16 2004  TROPONINI <0.03   ------------------------------------------------------------------------------------------------------------------  RADIOLOGY:  Dg Chest 2 View  Result Date: 11/06/2016 CLINICAL DATA:  83 y/o  F; chest pain and shortness of breath. EXAM: CHEST  2 VIEW COMPARISON:  08/15/2016 chest radiograph FINDINGS: Stable normal cardiac silhouette. Two lead pacemaker. Aortic atherosclerosis with  calcification. Clear lungs. No pleural effusion or pneumothorax. Bones are unremarkable. IMPRESSION: No acute pulmonary process identified. Electronically Signed   By: Kristine Garbe M.D.   On: 11/06/2016 20:35   Ct Angio Chest Aorta W And/or Wo Contrast  Result Date: 11/06/2016 CLINICAL DATA:  83 year old female with chest pain and elevated blood pressure. EXAM: CT ANGIOGRAPHY CHEST, ABDOMEN AND PELVIS TECHNIQUE: Multidetector CT imaging through the chest, abdomen and pelvis was performed using the standard protocol during bolus administration of intravenous contrast. Multiplanar reconstructed images and MIPs were obtained and reviewed to evaluate the vascular anatomy. CONTRAST:  100 cc Isovue  370 COMPARISON:  Chest radiograph dated 11/06/2016 FINDINGS: CTA CHEST FINDINGS Cardiovascular: Top-normal cardiac size. There is coronary vascular calcification primarily involving the LAD and left circumflex artery. There is no pericardial effusion. Left pectoral pacemaker device noted. There is moderate atherosclerotic calcification of the thoracic aorta. The ascending aorta measures up to 4.2 cm in diameter. The origins of the great vessels of the aortic arch appear patent. There is no CT evidence of pulmonary artery embolus. Mediastinum/Nodes: There is no hilar or mediastinal adenopathy. Is a small hiatal hernia. The esophagus is grossly unremarkable. There is irregular and nodular appearance of the left thyroid gland. Lungs/Pleura: Left lung base linear atelectasis/ scarring. There are small scattered pneumatoceles. There is no focal consolidation, pleural effusion, or pneumothorax. There is a 3 mm right lower lobe subpleural nodule (series 9, image 27). Punctate right lower lobe subpleural calcified granuloma. The central airways are patent. Musculoskeletal: The chest wall soft tissues appear unremarkable. Osteopenia with degenerative changes of the spine. No acute osseous pathology. Review of the MIP images confirms the above findings. CTA ABDOMEN AND PELVIS FINDINGS VASCULAR Aorta: Moderate atherosclerotic disease. No aneurysmal dilatation or dissection. Celiac: Patent without evidence of aneurysm, dissection, vasculitis or significant stenosis. Accessory left hepatic artery from the left gastric artery. SMA: Patent without evidence of aneurysm, dissection, vasculitis or significant stenosis. Renals: Both renal arteries are patent without evidence of aneurysm, dissection, vasculitis, fibromuscular dysplasia or significant stenosis. IMA: Patent without evidence of aneurysm, dissection, vasculitis or significant stenosis. Inflow: Atherosclerotic calcification of the iliac vasculature. No aneurysmal dilatation  or evidence of dissection. Veins: No obvious venous abnormality within the limitations of this arterial phase study. Review of the MIP images confirms the above findings. NON-VASCULAR No intra-abdominal free air or free fluid. Hepatobiliary: Cholecystectomy.  The liver is unremarkable. Pancreas: Unremarkable. No pancreatic ductal dilatation or surrounding inflammatory changes. Spleen: Normal in size without focal abnormality. Adrenals/Urinary Tract: There is a 13 mm indeterminate left adrenal nodule, possibly an adenoma. MRI may provide better characterization. The right adrenal gland is unremarkable. There are bilateral renal hypodense lesions measuring up to 11 mm in the interpolar aspect of the right kidney. The larger lesions demonstrate fluid attenuation most consistent with cysts. A subcentimeter left renal upper pole exophytic lesion is not well characterized but also likely represents a cyst. There is no hydronephrosis on either side. The visualized ureters and urinary bladder appear unremarkable. Stomach/Bowel: Small hiatal hernia. There is mild thickening of a segment of distal small bowel in the right hemipelvis with associated mild inflammatory changes concerning for enteritis. There is mild dilatation of a loop of bowel proximal to this segment likely representing reactive ileus. There is no evidence of bowel obstruction. There is sigmoid diverticulosis without active inflammatory changes. There is moderate stool throughout the colon. Normal appendix. Lymphatic: No adenopathy. Reproductive: The uterus and the ovaries are grossly unremarkable as visualized.  Other: None Musculoskeletal: Osteopenia multilevel degenerative changes of the spine with sclerotic changes of the lumbar vertebra. Multilevel disc desiccation with vacuum phenomena. No acute osseous pathology. Review of the MIP images confirms the above findings. IMPRESSION: 1. No CT evidence of aortic aneurysm for dissection. Mild prominence of the  ascending aorta measuring up to 4.2 cm in maximal diameter. Recommend annual imaging followup by CTA or MRA. This recommendation follows 2010 ACCF/AHA/AATS/ACR/ASA/SCA/SCAI/SIR/STS/SVM Guidelines for the Diagnosis and Management of Patients with Thoracic Aortic Disease. Circulation. 2010; 121: P950-D326 2. No CT evidence of pulmonary artery embolus. 3. Segmental enteritis of distal small bowel in the right hemipelvis with associated mild ileus. No bowel obstruction. Normal appendix. 4. Colonic diverticulosis.  No bowel obstruction.  Normal appendix. 5.  Aortic Atherosclerosis (ICD10-I70.0). Electronically Signed   By: Anner Crete M.D.   On: 11/06/2016 23:30   Ct Angio Abd/pel W And/or Wo Contrast  Result Date: 11/06/2016 CLINICAL DATA:  83 year old female with chest pain and elevated blood pressure. EXAM: CT ANGIOGRAPHY CHEST, ABDOMEN AND PELVIS TECHNIQUE: Multidetector CT imaging through the chest, abdomen and pelvis was performed using the standard protocol during bolus administration of intravenous contrast. Multiplanar reconstructed images and MIPs were obtained and reviewed to evaluate the vascular anatomy. CONTRAST:  100 cc Isovue 370 COMPARISON:  Chest radiograph dated 11/06/2016 FINDINGS: CTA CHEST FINDINGS Cardiovascular: Top-normal cardiac size. There is coronary vascular calcification primarily involving the LAD and left circumflex artery. There is no pericardial effusion. Left pectoral pacemaker device noted. There is moderate atherosclerotic calcification of the thoracic aorta. The ascending aorta measures up to 4.2 cm in diameter. The origins of the great vessels of the aortic arch appear patent. There is no CT evidence of pulmonary artery embolus. Mediastinum/Nodes: There is no hilar or mediastinal adenopathy. Is a small hiatal hernia. The esophagus is grossly unremarkable. There is irregular and nodular appearance of the left thyroid gland. Lungs/Pleura: Left lung base linear atelectasis/  scarring. There are small scattered pneumatoceles. There is no focal consolidation, pleural effusion, or pneumothorax. There is a 3 mm right lower lobe subpleural nodule (series 9, image 27). Punctate right lower lobe subpleural calcified granuloma. The central airways are patent. Musculoskeletal: The chest wall soft tissues appear unremarkable. Osteopenia with degenerative changes of the spine. No acute osseous pathology. Review of the MIP images confirms the above findings. CTA ABDOMEN AND PELVIS FINDINGS VASCULAR Aorta: Moderate atherosclerotic disease. No aneurysmal dilatation or dissection. Celiac: Patent without evidence of aneurysm, dissection, vasculitis or significant stenosis. Accessory left hepatic artery from the left gastric artery. SMA: Patent without evidence of aneurysm, dissection, vasculitis or significant stenosis. Renals: Both renal arteries are patent without evidence of aneurysm, dissection, vasculitis, fibromuscular dysplasia or significant stenosis. IMA: Patent without evidence of aneurysm, dissection, vasculitis or significant stenosis. Inflow: Atherosclerotic calcification of the iliac vasculature. No aneurysmal dilatation or evidence of dissection. Veins: No obvious venous abnormality within the limitations of this arterial phase study. Review of the MIP images confirms the above findings. NON-VASCULAR No intra-abdominal free air or free fluid. Hepatobiliary: Cholecystectomy.  The liver is unremarkable. Pancreas: Unremarkable. No pancreatic ductal dilatation or surrounding inflammatory changes. Spleen: Normal in size without focal abnormality. Adrenals/Urinary Tract: There is a 13 mm indeterminate left adrenal nodule, possibly an adenoma. MRI may provide better characterization. The right adrenal gland is unremarkable. There are bilateral renal hypodense lesions measuring up to 11 mm in the interpolar aspect of the right kidney. The larger lesions demonstrate fluid attenuation most  consistent with cysts.  A subcentimeter left renal upper pole exophytic lesion is not well characterized but also likely represents a cyst. There is no hydronephrosis on either side. The visualized ureters and urinary bladder appear unremarkable. Stomach/Bowel: Small hiatal hernia. There is mild thickening of a segment of distal small bowel in the right hemipelvis with associated mild inflammatory changes concerning for enteritis. There is mild dilatation of a loop of bowel proximal to this segment likely representing reactive ileus. There is no evidence of bowel obstruction. There is sigmoid diverticulosis without active inflammatory changes. There is moderate stool throughout the colon. Normal appendix. Lymphatic: No adenopathy. Reproductive: The uterus and the ovaries are grossly unremarkable as visualized. Other: None Musculoskeletal: Osteopenia multilevel degenerative changes of the spine with sclerotic changes of the lumbar vertebra. Multilevel disc desiccation with vacuum phenomena. No acute osseous pathology. Review of the MIP images confirms the above findings. IMPRESSION: 1. No CT evidence of aortic aneurysm for dissection. Mild prominence of the ascending aorta measuring up to 4.2 cm in maximal diameter. Recommend annual imaging followup by CTA or MRA. This recommendation follows 2010 ACCF/AHA/AATS/ACR/ASA/SCA/SCAI/SIR/STS/SVM Guidelines for the Diagnosis and Management of Patients with Thoracic Aortic Disease. Circulation. 2010; 121: [id number] 2. No CT evidence of pulmonary artery embolus. 3. Segmental enteritis of distal small bowel in the right hemipelvis with associated mild ileus. No bowel obstruction. Normal appendix. 4. Colonic diverticulosis.  No bowel obstruction.  Normal appendix. 5.  Aortic Atherosclerosis (ICD10-I70.0). Electronically Signed   By: Anner Crete M.D.   On: 11/06/2016 23:30    EKG:   Orders placed or performed during the hospital encounter of 11/06/16  . ED EKG within 10  minutes  . ED EKG within 10 minutes    IMPRESSION AND PLAN:  Principal Problem:   Chest pain with moderate risk for cardiac etiology - patient states that she has significantly fluctuating blood pressure as of late. Episodes of significant hypertension could potentially contribute to her chest pain. Otherwise etiology is unclear. We'll trend her cardiac enzymes and get a cardiology consult Active Problems:   Coronary artery disease, non-occlusive - continue home meds   Essential hypertension - continue home medications, monitor closely for any significant fluctuations   HLD (hyperlipidemia) - home dose antilipid  All the records are reviewed and case discussed with ED provider. Management plans discussed with the patient and/or family.  DVT PROPHYLAXIS: Systemic anticoagulation  GI PROPHYLAXIS: PPI  ADMISSION STATUS: Observation  CODE STATUS: Full Code Status History    Date Active Date Inactive Code Status Order ID Comments User Context   08/15/2016  6:17 PM 08/16/2016  9:05 PM Full Code [id number]  Dustin Flock, MD Inpatient      TOTAL TIME TAKING CARE OF THIS PATIENT: 40 minutes.   Chonita Gadea Grasston 11/07/2016, 12:47 AM  [facility]  Office  [phone]  CC: Primary care physician; Clarisse Gouge, MD  Note:  This document was prepared using Dragon voice recognition software and may include unintentional dictation errors.

## 2016-11-14 ENCOUNTER — Encounter: Payer: Self-pay | Admitting: Internal Medicine

## 2016-11-14 ENCOUNTER — Ambulatory Visit (INDEPENDENT_AMBULATORY_CARE_PROVIDER_SITE_OTHER): Payer: Medicare Other | Admitting: Internal Medicine

## 2016-11-14 VITALS — BP 136/92 | HR 74 | Ht 64.0 in | Wt 168.8 lb

## 2016-11-14 DIAGNOSIS — I495 Sick sinus syndrome: Secondary | ICD-10-CM

## 2016-11-14 DIAGNOSIS — Z950 Presence of cardiac pacemaker: Secondary | ICD-10-CM

## 2016-11-14 DIAGNOSIS — I480 Paroxysmal atrial fibrillation: Secondary | ICD-10-CM

## 2016-11-14 DIAGNOSIS — Z79899 Other long term (current) drug therapy: Secondary | ICD-10-CM | POA: Diagnosis not present

## 2016-11-14 LAB — CUP PACEART INCLINIC DEVICE CHECK
Brady Statistic AP VP Percent: 0 %
Brady Statistic AP VS Percent: 95 %
Brady Statistic AS VP Percent: 0 %
Brady Statistic AS VS Percent: 5 %
Date Time Interrogation Session: 20180724141545
Implantable Lead Location: 753860
Lead Channel Impedance Value: 577 Ohm
Lead Channel Pacing Threshold Amplitude: 0.5 V
Lead Channel Pacing Threshold Pulse Width: 0.4 ms
Lead Channel Sensing Intrinsic Amplitude: 15.67 mV
Lead Channel Setting Pacing Pulse Width: 0.4 ms
MDC IDC LEAD IMPLANT DT: 20160816
MDC IDC LEAD IMPLANT DT: 20160816
MDC IDC LEAD LOCATION: 753859
MDC IDC MSMT BATTERY IMPEDANCE: 135 Ohm
MDC IDC MSMT BATTERY REMAINING LONGEVITY: 146 mo
MDC IDC MSMT BATTERY VOLTAGE: 2.79 V
MDC IDC MSMT LEADCHNL RA IMPEDANCE VALUE: 509 Ohm
MDC IDC MSMT LEADCHNL RA SENSING INTR AMPL: 4 mV
MDC IDC MSMT LEADCHNL RV PACING THRESHOLD AMPLITUDE: 0.75 V
MDC IDC MSMT LEADCHNL RV PACING THRESHOLD PULSEWIDTH: 0.4 ms
MDC IDC PG IMPLANT DT: 20160816
MDC IDC SET LEADCHNL RA PACING AMPLITUDE: 2 V
MDC IDC SET LEADCHNL RV PACING AMPLITUDE: 2.5 V
MDC IDC SET LEADCHNL RV SENSING SENSITIVITY: 5.6 mV

## 2016-11-14 MED ORDER — SOTALOL HCL 80 MG PO TABS: 80.0000 mg | ORAL_TABLET | Freq: Two times a day (BID) | ORAL | 3 refills | Status: DC

## 2016-11-14 NOTE — Progress Notes (Signed)
ELECTROPHYSIOLOGY OFFICE NOTE  Patient ID: Chelsey Williams, MRN: [id number], DOB/AGE: 81-Sep-1929 81 y.o. Admit date: (Not on file) Date of Consult: 11/14/2016  Primary Physician: Clarisse Gouge, MD Primary Cardiologist: new     Chelsey Williams is a 83 y.o. female who is being seen today At her request to establish EP care   HPI Chelsey Williams is a 83 y.o. female  with a history of atrial fibrillation previously seen at [facility] by Dr. Omelia Blackwater. Pacemaker was implanted in [place]. At [facility] she was admitted and started on sotalol. She underwent cardioversion with significant interval improvement. She has had no interval atrial fibrillation.  She does not encroach upon her exercise limits because of dyspnea or chest discomfort. She has significant limitations related to back pain. She's been told to avoid NSAIDs.  She has a history of hypertension and has always felt better with a blood pressure in the 140s. Since the initiation of the sotalol her blood pressures in the 110-20 range with some tendency to drop into the high 80s. She has not had syncope.    Echocardiogram 8/16 demonstrated normal LV function mild proximal septal hypertrophy elevated EE prime mean aortic valve gradient 10.7 8/16 catheterization nonobstructive coronary disease  Date Cr K Hgb  7/18 0.64 3.8 12.2            Thromboembolic risk factors ( age  -2, HTN-1,  Gender-1 ) for a CHADSVASc Score of  4   Past Medical History:  Diagnosis Date  . Anxiety   . Bilateral hearing loss   . Coronary artery disease, non-occlusive    a. LHC 11/2014: R dom, LM nl, mLAD 30-40, dLAD minor irregs, LCx minor irregs, RCA minor irregs w/ associated ectasia  . Depression   . GERD (gastroesophageal reflux disease)   . Glaucoma   . Hyperlipemia   . Hypertension   . Macular degeneration   . OA (osteoarthritis)   . Pacemaker    a. Medtronic Adapta ADDRL1 dual-chamber pacemaker (serial number [id number]) with Medtronic 7517 atrial lead  (serial number [id number]) and Medtronic 5076 ventricular lead (serial number [id number]), all implanted 12/08/2014 by Dr. Jacklynn Barnacle at [facility] [phone].    Marland Kitchen Persistent atrial fibrillation ([place])    a. s/p DCCV 02/2016; b. sotalol and Eliquis; c. CHADS2VASc --> 4 (HTN, age x 2, female)  . Sinus node dysfunction [facility])       Surgical History:  Past Surgical History:  Procedure Laterality Date  . CHOLECYSTECTOMY    . knee replacmen    . PACEMAKER IMPLANT    . throid node       Home Meds: Prior to Admission medications   Medication Sig Start Date End Date Taking? Authorizing Provider  albuterol (PROVENTIL HFA;VENTOLIN HFA) 108 (90 Base) MCG/ACT inhaler Inhale into the lungs. 02/21/16 02/20/17 Yes [provider]  apixaban (ELIQUIS) 5 MG TABS tablet Take 5 mg by mouth 2 (two) times daily.  04/06/16  Yes [provider]  cholecalciferol (VITAMIN D) 1000 units tablet Take 1,000 Units by mouth daily.   Yes [provider]  cholestyramine (QUESTRAN) 4 g packet Take by mouth 2 (two) times daily.  11/13/16  Yes [provider]  folic acid (FOLVITE) 1 MG tablet Take 1 mg by mouth daily.   Yes [provider]  hydrALAZINE (APRESOLINE) 10 MG tablet Take 1 tablet (10 mg total) by mouth 3 (three) times daily. 08/16/16  Yes Vaughan Basta, MD  HYDROcodone-acetaminophen (NORCO/VICODIN) 5-325 MG tablet  take 1 tablet by mouth every 6 hours if needed for pain 10/31/16  Yes [provider]  ibuprofen (ADVIL,MOTRIN) 200 MG tablet Take 200 mg by mouth every 6 (six) hours as needed for fever or mild pain.    Yes [provider]  Lecithin 1200 MG CAPS Take by mouth.   Yes [provider]  lisinopril (PRINIVIL,ZESTRIL) 20 MG tablet Take 1 tablet (20 mg total) by mouth 2 (two) times daily. 11/07/16  Yes Sainani, Belia Heman, MD  olopatadine (PATANOL) 0.1 % ophthalmic solution Place 1 drop into the right eye 2 (two) times daily.  11/07/16   Yes [provider]  Omega-3 Fatty Acids (FISH OIL) 1200 MG CAPS Take by mouth.   Yes [provider]  omeprazole (PRILOSEC) 20 MG capsule Take 20 mg by mouth daily as needed.    Yes [provider]  psyllium (METAMUCIL) 58.6 % packet Take 1 packet by mouth daily as needed.   Yes [provider]  sotalol (BETAPACE) 80 MG tablet Take 80 mg by mouth every 12 (twelve) hours. 03/24/16  Yes [provider]  vitamin B-12 (CYANOCOBALAMIN) 1000 MCG tablet Take 1,000 mcg by mouth daily.   Yes [provider]    Allergies:  Allergies  Allergen Reactions  . Cephalexin Anaphylaxis    Shock / Unconsciousness  . Diltiazem Hcl Swelling  . Hydrochlorothiazide Other (See Comments)    dizziness  . Prednisone Shortness Of Breath    SOB  . Felodipine Nausea Only    Gastrointestinal problems, e.g., nausea, vomiting, diarrheaShock / Unconsciousnessmuscle tenderness  . Other Other (See Comments)    A blood pressure med  . Statins Other (See Comments)  . Amlodipine Other (See Comments) and Palpitations  . Atenolol Palpitations    heart palpitations    Social History   Social History  . Marital status: Widowed    Spouse name: N/A  . Number of children: N/A  . Years of education: N/A   Occupational History  . Not on file.   Social History Main Topics  . Smoking status: Former Smoker    Quit date: 11/15/1999  . Smokeless tobacco: Never Used  . Alcohol use No  . Drug use: No  . Sexual activity: Not on file   Other Topics Concern  . Not on file   Social History Narrative  . No narrative on file     Family History  Problem Relation Age of Onset  . Hypertension Mother   . Stroke Mother   . Hypertension Father      ROS:  Please see the history of present illness.     All other systems reviewed and negative.    Physical Exam:  Blood pressure (!) 136/92, pulse 74, height 5' 4"  (1.626 m), weight 168 lb 12 oz (76.5 kg). General: Well  developed, well nourished female in no acute distress. Head: Normocephalic, atraumatic, sclera non-icteric, no xanthomas, nares are without discharge. EENT: normal  Lymph Nodes:  none Neck: Negative for carotid bruits. JVD not elevated. Back:without scoliosis kyphosis Lungs: Clear bilaterally to auscultation without wheezes, rales, or rhonchi. Breathing is unlabored. Heart: RRR with S1 S2.  2/6 systolic  murmur . No rubs, or gallops appreciated. Abdomen: Soft, non-tender, non-distended with normoactive bowel sounds. No hepatomegaly. No rebound/guarding. No obvious abdominal masses. Msk:  Strength and tone appear normal for age. Extremities: No clubbing or cyanosis. No  edema.  Distal pedal pulses are 2+ and equal bilaterally. Skin: Warm and Dry  Neuro: Alert and oriented X 3. CN III-XII intact Grossly normal sensory and motor function . Psych:  Responds to questions appropriately with a normal affect.      Labs: Cardiac Enzymes No results for input(s): CKTOTAL, CKMB, TROPONINI in the last 72 hours. CBC Lab Results  Component Value Date   WBC 6.7 11/07/2016   HGB 12.2 11/07/2016   HCT 35.5 11/07/2016   MCV 87.6 11/07/2016   PLT 294 11/07/2016   PROTIME: No results for input(s): LABPROT, INR in the last 72 hours. Chemistry No results for input(s): NA, K, CL, CO2, BUN, CREATININE, CALCIUM, PROT, BILITOT, ALKPHOS, ALT, AST, GLUCOSE in the last 168 hours.  Invalid input(s): LABALBU Lipids No results found for: CHOL, HDL, LDLCALC, TRIG BNP No results found for: PROBNP Thyroid Function Tests: No results for input(s): TSH, T4TOTAL, T3FREE, THYROIDAB in the last 72 hours.  Invalid input(s): FREET3 Miscellaneous No results found for: DDIMER  Radiology/Studies:  Dg Chest 2 View  Result Date: 11/06/2016 CLINICAL DATA:  83 y/o  F; chest pain and shortness of breath. EXAM: CHEST  2 VIEW COMPARISON:  08/15/2016 chest radiograph FINDINGS: Stable normal cardiac silhouette. Two lead  pacemaker. Aortic atherosclerosis with calcification. Clear lungs. No pleural effusion or pneumothorax. Bones are unremarkable. IMPRESSION: No acute pulmonary process identified. Electronically Signed   By: Kristine Garbe M.D.   On: 11/06/2016 20:35   Ct Angio Chest Aorta W And/or Wo Contrast  Result Date: 11/06/2016 CLINICAL DATA:  83 year old female with chest pain and elevated blood pressure. EXAM: CT ANGIOGRAPHY CHEST, ABDOMEN AND PELVIS TECHNIQUE: Multidetector CT imaging through the chest, abdomen and pelvis was performed using the standard protocol during bolus administration of intravenous contrast. Multiplanar reconstructed images and MIPs were obtained and reviewed to evaluate the vascular anatomy. CONTRAST:  100 cc Isovue 370 COMPARISON:  Chest radiograph dated 11/06/2016 FINDINGS: CTA CHEST FINDINGS Cardiovascular: Top-normal cardiac size. There is coronary vascular calcification primarily involving the LAD and left circumflex artery. There is no pericardial effusion. Left pectoral pacemaker device noted. There is moderate atherosclerotic calcification of the thoracic aorta. The ascending aorta measures up to 4.2 cm in diameter. The origins of the great vessels of the aortic arch appear patent. There is no CT evidence of pulmonary artery embolus. Mediastinum/Nodes: There is no hilar or mediastinal adenopathy. Is a small hiatal hernia. The esophagus is grossly unremarkable. There is irregular and nodular appearance of the left thyroid gland. Lungs/Pleura: Left lung base linear atelectasis/ scarring. There are small scattered pneumatoceles. There is no focal consolidation, pleural effusion, or pneumothorax. There is a 3 mm right lower lobe subpleural nodule (series 9, image 27). Punctate right lower lobe subpleural calcified granuloma. The central airways are patent. Musculoskeletal: The chest wall soft tissues appear unremarkable. Osteopenia with degenerative changes of the spine. No acute  osseous pathology. Review of the MIP images confirms the above findings. CTA ABDOMEN AND PELVIS FINDINGS VASCULAR Aorta: Moderate atherosclerotic disease. No aneurysmal dilatation or dissection. Celiac: Patent without evidence of aneurysm, dissection, vasculitis or significant stenosis. Accessory left hepatic artery from the left gastric artery. SMA: Patent without evidence of aneurysm, dissection, vasculitis or significant stenosis. Renals: Both renal arteries are patent without evidence of aneurysm, dissection, vasculitis, fibromuscular dysplasia or significant stenosis. IMA: Patent without evidence of aneurysm, dissection, vasculitis or significant stenosis. Inflow: Atherosclerotic calcification of the iliac vasculature. No aneurysmal dilatation or evidence of dissection. Veins: No obvious venous abnormality within the limitations of this arterial phase study. Review of the MIP images  confirms the above findings. NON-VASCULAR No intra-abdominal free air or free fluid. Hepatobiliary: Cholecystectomy.  The liver is unremarkable. Pancreas: Unremarkable. No pancreatic ductal dilatation or surrounding inflammatory changes. Spleen: Normal in size without focal abnormality. Adrenals/Urinary Tract: There is a 13 mm indeterminate left adrenal nodule, possibly an adenoma. MRI may provide better characterization. The right adrenal gland is unremarkable. There are bilateral renal hypodense lesions measuring up to 11 mm in the interpolar aspect of the right kidney. The larger lesions demonstrate fluid attenuation most consistent with cysts. A subcentimeter left renal upper pole exophytic lesion is not well characterized but also likely represents a cyst. There is no hydronephrosis on either side. The visualized ureters and urinary bladder appear unremarkable. Stomach/Bowel: Small hiatal hernia. There is mild thickening of a segment of distal small bowel in the right hemipelvis with associated mild inflammatory changes  concerning for enteritis. There is mild dilatation of a loop of bowel proximal to this segment likely representing reactive ileus. There is no evidence of bowel obstruction. There is sigmoid diverticulosis without active inflammatory changes. There is moderate stool throughout the colon. Normal appendix. Lymphatic: No adenopathy. Reproductive: The uterus and the ovaries are grossly unremarkable as visualized. Other: None Musculoskeletal: Osteopenia multilevel degenerative changes of the spine with sclerotic changes of the lumbar vertebra. Multilevel disc desiccation with vacuum phenomena. No acute osseous pathology. Review of the MIP images confirms the above findings. IMPRESSION: 1. No CT evidence of aortic aneurysm for dissection. Mild prominence of the ascending aorta measuring up to 4.2 cm in maximal diameter. Recommend annual imaging followup by CTA or MRA. This recommendation follows 2010 ACCF/AHA/AATS/ACR/ASA/SCA/SCAI/SIR/STS/SVM Guidelines for the Diagnosis and Management of Patients with Thoracic Aortic Disease. Circulation. 2010; 121: N629-B284 2. No CT evidence of pulmonary artery embolus. 3. Segmental enteritis of distal small bowel in the right hemipelvis with associated mild ileus. No bowel obstruction. Normal appendix. 4. Colonic diverticulosis.  No bowel obstruction.  Normal appendix. 5.  Aortic Atherosclerosis (ICD10-I70.0). Electronically Signed   By: Anner Crete M.D.   On: 11/06/2016 23:30   Ct Angio Abd/pel W And/or Wo Contrast  Result Date: 11/06/2016 CLINICAL DATA:  83 year old female with chest pain and elevated blood pressure. EXAM: CT ANGIOGRAPHY CHEST, ABDOMEN AND PELVIS TECHNIQUE: Multidetector CT imaging through the chest, abdomen and pelvis was performed using the standard protocol during bolus administration of intravenous contrast. Multiplanar reconstructed images and MIPs were obtained and reviewed to evaluate the vascular anatomy. CONTRAST:  100 cc Isovue 370 COMPARISON:   Chest radiograph dated 11/06/2016 FINDINGS: CTA CHEST FINDINGS Cardiovascular: Top-normal cardiac size. There is coronary vascular calcification primarily involving the LAD and left circumflex artery. There is no pericardial effusion. Left pectoral pacemaker device noted. There is moderate atherosclerotic calcification of the thoracic aorta. The ascending aorta measures up to 4.2 cm in diameter. The origins of the great vessels of the aortic arch appear patent. There is no CT evidence of pulmonary artery embolus. Mediastinum/Nodes: There is no hilar or mediastinal adenopathy. Is a small hiatal hernia. The esophagus is grossly unremarkable. There is irregular and nodular appearance of the left thyroid gland. Lungs/Pleura: Left lung base linear atelectasis/ scarring. There are small scattered pneumatoceles. There is no focal consolidation, pleural effusion, or pneumothorax. There is a 3 mm right lower lobe subpleural nodule (series 9, image 27). Punctate right lower lobe subpleural calcified granuloma. The central airways are patent. Musculoskeletal: The chest wall soft tissues appear unremarkable. Osteopenia with degenerative changes of the spine. No acute osseous pathology. Review  of the MIP images confirms the above findings. CTA ABDOMEN AND PELVIS FINDINGS VASCULAR Aorta: Moderate atherosclerotic disease. No aneurysmal dilatation or dissection. Celiac: Patent without evidence of aneurysm, dissection, vasculitis or significant stenosis. Accessory left hepatic artery from the left gastric artery. SMA: Patent without evidence of aneurysm, dissection, vasculitis or significant stenosis. Renals: Both renal arteries are patent without evidence of aneurysm, dissection, vasculitis, fibromuscular dysplasia or significant stenosis. IMA: Patent without evidence of aneurysm, dissection, vasculitis or significant stenosis. Inflow: Atherosclerotic calcification of the iliac vasculature. No aneurysmal dilatation or evidence of  dissection. Veins: No obvious venous abnormality within the limitations of this arterial phase study. Review of the MIP images confirms the above findings. NON-VASCULAR No intra-abdominal free air or free fluid. Hepatobiliary: Cholecystectomy.  The liver is unremarkable. Pancreas: Unremarkable. No pancreatic ductal dilatation or surrounding inflammatory changes. Spleen: Normal in size without focal abnormality. Adrenals/Urinary Tract: There is a 13 mm indeterminate left adrenal nodule, possibly an adenoma. MRI may provide better characterization. The right adrenal gland is unremarkable. There are bilateral renal hypodense lesions measuring up to 11 mm in the interpolar aspect of the right kidney. The larger lesions demonstrate fluid attenuation most consistent with cysts. A subcentimeter left renal upper pole exophytic lesion is not well characterized but also likely represents a cyst. There is no hydronephrosis on either side. The visualized ureters and urinary bladder appear unremarkable. Stomach/Bowel: Small hiatal hernia. There is mild thickening of a segment of distal small bowel in the right hemipelvis with associated mild inflammatory changes concerning for enteritis. There is mild dilatation of a loop of bowel proximal to this segment likely representing reactive ileus. There is no evidence of bowel obstruction. There is sigmoid diverticulosis without active inflammatory changes. There is moderate stool throughout the colon. Normal appendix. Lymphatic: No adenopathy. Reproductive: The uterus and the ovaries are grossly unremarkable as visualized. Other: None Musculoskeletal: Osteopenia multilevel degenerative changes of the spine with sclerotic changes of the lumbar vertebra. Multilevel disc desiccation with vacuum phenomena. No acute osseous pathology. Review of the MIP images confirms the above findings. IMPRESSION: 1. No CT evidence of aortic aneurysm for dissection. Mild prominence of the ascending aorta  measuring up to 4.2 cm in maximal diameter. Recommend annual imaging followup by CTA or MRA. This recommendation follows 2010 ACCF/AHA/AATS/ACR/ASA/SCA/SCAI/SIR/STS/SVM Guidelines for the Diagnosis and Management of Patients with Thoracic Aortic Disease. Circulation. 2010; 121: I297-L892 2. No CT evidence of pulmonary artery embolus. 3. Segmental enteritis of distal small bowel in the right hemipelvis with associated mild ileus. No bowel obstruction. Normal appendix. 4. Colonic diverticulosis.  No bowel obstruction.  Normal appendix. 5.  Aortic Atherosclerosis (ICD10-I70.0). Electronically Signed   By: Anner Crete M.D.   On: 11/06/2016 23:30    EKG: Atrial paced at 74 Intervals 24/08/41 Axis left -11 Borderline LVH  Assessment and Plan:   Atrial fibrillation-persistent    sotalol therapy    sinus node dysfunction  Pacemaker-Medtronic  Hypertension  Back pain  Hypotension-episodic  The patient has persistent atrial fibrillation now well controlled on sotalol therapy albeit at 80 mg twice daily. Surveillance laboratories notable for mild hypokalemia. We will have her increase her potassium intake.  ECGs are within range for sotalol.   No interval bleeding.  Doses appropriate for age  With her hypotension and symptoms with blood pressures of 110-20, we will discontinue her hydralazine. She will use it as needed for blood pressures of over 180.  Given the severity of her back pain, we have discussed the  implications of  NSAIDs in conjunction with NOACs. I suggested that intermittent and infrequent NSAID may be worth the risks of a mild increase in bleeding given the degree of her back pain. She is aware of the risk benefit discussion  Chelsey Williams

## 2016-11-14 NOTE — Addendum Note (Signed)
Addended byAlvis Lemmings C on: 11/14/2016 11:56 AM   Modules accepted: Orders

## 2016-11-14 NOTE — Patient Instructions (Signed)
Medication Instructions: - Your physician has recommended you make the following change in your medication:  1) Stop hydralazine  Labwork: -  None ordered  Procedures/Testing: - none ordered  Follow-Up: - Remote monitoring is used to monitor your Pacemaker of ICD from home. This monitoring reduces the number of office visits required to check your device to one time per year. It allows [place] to keep an eye on the functioning of your device to ensure it is working properly. You are scheduled for a device check from home on 02/13/17. You may send your transmission at any time that day. If you have a wireless device, the transmission will be sent automatically. After your physician reviews your transmission, you will receive a postcard with your next transmission date.  - Your physician wants you to follow-up in: 6 months with Dr. Caryl Comes. You will receive a reminder letter in the mail two months in advance. If you don't receive a letter, please call our office to schedule the follow-up appointment.  Any Additional Special Instructions Will Be Listed Below (If Applicable).     If you need a refill on your cardiac medications before your next appointment, please call your pharmacy.

## 2016-11-17 ENCOUNTER — Ambulatory Visit (INDEPENDENT_AMBULATORY_CARE_PROVIDER_SITE_OTHER): Payer: Medicare Other | Admitting: Vascular Surgery

## 2016-12-08 NOTE — Addendum Note (Signed)
Addended by: Anselm Pancoast on: 12/08/2016 02:37 PM   Modules accepted: Orders

## 2016-12-29 ENCOUNTER — Ambulatory Visit (INDEPENDENT_AMBULATORY_CARE_PROVIDER_SITE_OTHER): Payer: Medicare Other | Admitting: Vascular Surgery

## 2017-02-02 ENCOUNTER — Ambulatory Visit (INDEPENDENT_AMBULATORY_CARE_PROVIDER_SITE_OTHER): Payer: Medicare Other | Admitting: Vascular Surgery

## 2017-02-13 ENCOUNTER — Encounter: Payer: Medicare Other | Admitting: *Deleted

## 2017-02-15 ENCOUNTER — Encounter: Payer: Self-pay | Admitting: Cardiology

## 2017-02-19 ENCOUNTER — Ambulatory Visit (INDEPENDENT_AMBULATORY_CARE_PROVIDER_SITE_OTHER): Payer: Medicare Other | Admitting: *Deleted

## 2017-02-19 DIAGNOSIS — I495 Sick sinus syndrome: Secondary | ICD-10-CM | POA: Diagnosis not present

## 2017-02-19 NOTE — Progress Notes (Signed)
Remote pacemaker transmission.   

## 2017-02-20 LAB — CUP PACEART REMOTE DEVICE CHECK
Battery Impedance: 135 Ohm
Battery Remaining Longevity: 131 mo
Battery Voltage: 2.79 V
Brady Statistic AP VS Percent: 94 %
Brady Statistic AS VP Percent: 0 %
Date Time Interrogation Session: 20181027142740
Implantable Lead Implant Date: 20160816
Implantable Lead Location: 753859
Implantable Lead Model: 4574
Implantable Lead Model: 5076
Implantable Pulse Generator Implant Date: 20160816
Lead Channel Impedance Value: 541 Ohm
Lead Channel Impedance Value: 609 Ohm
Lead Channel Pacing Threshold Amplitude: 0.375 V
Lead Channel Pacing Threshold Pulse Width: 0.4 ms
Lead Channel Setting Pacing Amplitude: 2.5 V
MDC IDC LEAD IMPLANT DT: 20160816
MDC IDC LEAD LOCATION: 753860
MDC IDC MSMT LEADCHNL RV PACING THRESHOLD AMPLITUDE: 0.625 V
MDC IDC MSMT LEADCHNL RV PACING THRESHOLD PULSEWIDTH: 0.4 ms
MDC IDC MSMT LEADCHNL RV SENSING INTR AMPL: 11.2 mV
MDC IDC SET LEADCHNL RA PACING AMPLITUDE: 2 V
MDC IDC SET LEADCHNL RV PACING PULSEWIDTH: 0.4 ms
MDC IDC SET LEADCHNL RV SENSING SENSITIVITY: 5.6 mV
MDC IDC STAT BRADY AP VP PERCENT: 0 %
MDC IDC STAT BRADY AS VS PERCENT: 5 %

## 2017-02-27 ENCOUNTER — Encounter: Payer: Self-pay | Admitting: Cardiology

## 2017-04-30 ENCOUNTER — Telehealth: Payer: Self-pay | Admitting: Physician Assistant

## 2017-04-30 ENCOUNTER — Telehealth: Payer: Self-pay | Admitting: Internal Medicine

## 2017-04-30 NOTE — Telephone Encounter (Signed)
   Chart reviewed as part of pre-operative protocol coverage. As a tooth extraction is low risk procedure not requiring cardiac clearance, based on ACC/AHA guidelines, Bonniejean Kretchmer would be at acceptable risk for the planned procedure without further cardiovascular testing. Per pharmacist recommendation, "Per office protocol we recommend against holding Eliquis for simple tooth extraction. "  I will route this recommendation to the requesting party via [facility] fax function and remove from pre-op pool.  Please call with questions.  Charlie Pitter, PA-C 04/30/2017, 12:53 PM

## 2017-04-30 NOTE — Telephone Encounter (Signed)
Per office protocol we recommend against holding Eliquis for simple tooth extraction.

## 2017-04-30 NOTE — Telephone Encounter (Signed)
Please see other phone note and call dental office. Thank you. Dayna Dunn PA-C

## 2017-04-30 NOTE — Telephone Encounter (Signed)
F/U call:  [facility] calling back. States that they have not received clearance and asked that we fax it to [phone]. Patient coming to dentist office today at 6:30pm.

## 2017-04-30 NOTE — Telephone Encounter (Signed)
Previous clearance phone note faxed to listed number and  Ok confirmation received .Adonis Housekeeper

## 2017-04-30 NOTE — Telephone Encounter (Signed)
Called [facility], spoke with Langley Gauss, to let them know pt has been cleared for tooth extraction, but we do not recommend holding pt's eliquis for simple tooth extraction. She is wanting to know if it happens to be a surgical extraction, will pt need to hold the Eliquis?  Per Melina Copa, PA-C, pt will not need to hold the Eliquis.

## 2017-04-30 NOTE — Telephone Encounter (Signed)
   Received following message to preop inbox:  ----- Message -----  From: Jeanann Lewandowsky, RMA  Sent: 04/30/2017  7:11 AM  To: [facility] Preop   [facility] called 7:30 pm on Friday needing urgent clearance for tooth extraction. Pt is on Eliquis.    They have not yet scheduled the procedure.  They need medical clearance and advice on Eliquis.    Contact [facility]  Phone [phone]  FAX [phone]   I did not know how to start on regular note sorry. I think it could be cleared if someone talked to the pt. She has non critical disease. Thanks.     Nurse called to clarify type of extraction and this will not require any anesthesia (simple extraction). Will send to pharm for input on Eliquis then plan to route clearance to requesting party.   Charlie Pitter, PA-C 04/30/2017, 11:28 AM

## 2017-05-01 ENCOUNTER — Telehealth: Payer: Self-pay | Admitting: Internal Medicine

## 2017-05-01 ENCOUNTER — Telehealth: Payer: Self-pay | Admitting: *Deleted

## 2017-05-01 NOTE — Telephone Encounter (Signed)
   Primary Cardiologist:  Dr. Caryl Comes   Chart reviewed as part of pre-operative protocol coverage. Based on ACC/AHA guidelines, Deem Taflinger would be at acceptable risk for the planned procedure without further cardiovascular testing (dental extraction).   I will route to pharm preop pool to address anticoagulation.     Lyda Jester, PA-C 05/01/2017, 2:49 PM

## 2017-05-01 NOTE — Telephone Encounter (Signed)
Will fax information to requesting office.  Attempted to contact pt.  VM is full.  Unable to leave message.

## 2017-05-01 NOTE — Telephone Encounter (Signed)
Typically do not hold Eliquis for single dental extraction. If needed, ok to hold Eliquis for 1 day prior since CHADS2VASc score is 77 (age x2, sex, CAD, HTN).

## 2017-05-01 NOTE — Telephone Encounter (Addendum)
-----   Message from Isaiah Serge, NP sent at 04/27/2017  7:51 PM EST ----- [facility] called 7:30 pm on Friday needing urgent clearance for tooth extraction.  Pt is on Eliquis.    They have not yet scheduled the procedure.  They need medical clearance and advice on Eliquis.    Contact [facility] Phone [phone] FAX [phone]   I think it could be cleared if someone talked to the pt.  She has non critical disease.  Thanks.

## 2017-05-01 NOTE — Telephone Encounter (Signed)
I spoke with the patient's son, Aaron Edelman [facility]). I advised him of Dr. Olin Pia recommendations for the patient to resume her blood thinner as soon as the oral surgeon felt safe.  He voices understanding.  Per Aaron Edelman, he thinks the patient is taking amoxicillin for her infection.

## 2017-05-01 NOTE — Telephone Encounter (Signed)
Hopefully wll have no problems  Resume per oral surgery

## 2017-05-01 NOTE — Telephone Encounter (Signed)
Pt son, Aaron Edelman, called, states pt had dental surgery yesterday, and they noticed that she had a pus pocket, her dentist thinks it is an infection, and advised the pt to contact us. Please call to advise what she can do. Pt is on Eliquis, she did stop for the surgery, and her dentist request she discontinue not to take if for 2 more days.

## 2017-05-01 NOTE — Telephone Encounter (Signed)
Will need to review with Dr. Caryl Comes. Forwarded to MD.

## 2017-05-02 ENCOUNTER — Telehealth: Payer: Self-pay | Admitting: Physician Assistant

## 2017-05-02 NOTE — Telephone Encounter (Signed)
Attempted to call Aaron Edelman again- no answer. Mailbox is full.

## 2017-05-02 NOTE — Telephone Encounter (Signed)
Patient sons is calling for antibiotics for recent tooth extraction. He will call office for further recommendations.

## 2017-05-02 NOTE — Telephone Encounter (Signed)
Pt is son calling stating pt is going to need a amoxicillin  She is starting to get an infection from procedure on teeth.    She is back on eliquis   Would like some advise on what medication we can give patient since she can't take certain kinds due to she is a heart patient.   Please call back

## 2017-05-02 NOTE — Telephone Encounter (Signed)
Attempted to call Aaron Edelman.  His mailbox is full.

## 2017-05-02 NOTE — Telephone Encounter (Signed)
I spoke with Aaron Edelman. He states that he thought the patient had an antibiotic to take for the infection around her tooth, but she does not. I advised him that she will need to get this from the physician that is performing her dental work.  I have also advised him if the dental office has any questions, then they can call our office. He voices understanding.

## 2017-05-11 ENCOUNTER — Telehealth: Payer: Self-pay | Admitting: Internal Medicine

## 2017-05-11 NOTE — Telephone Encounter (Signed)
Patient has had a recent tooth extraction  Patient would like to know what antibiotic she can take  Please call to discuss

## 2017-05-11 NOTE — Telephone Encounter (Signed)
Spoke with patients son and he wanted to see if we could give his mother antibiotic for her recent tooth extraction. Advised that from a cardiac perspective she did not need one unless she is having active symptoms for that site. Reviewed with Nira Conn RN regarding her device placed 2016 and no need for that far out. Reviewed all information with patients son and he verbalized understanding and was agreeable to check with dental office. He had no further questions at this time.

## 2017-05-21 ENCOUNTER — Ambulatory Visit (INDEPENDENT_AMBULATORY_CARE_PROVIDER_SITE_OTHER): Payer: Medicare Other | Admitting: *Deleted

## 2017-05-21 DIAGNOSIS — I495 Sick sinus syndrome: Secondary | ICD-10-CM

## 2017-05-21 NOTE — Progress Notes (Signed)
Remote pacemaker transmission.   

## 2017-05-22 ENCOUNTER — Encounter: Payer: Self-pay | Admitting: Internal Medicine

## 2017-05-22 ENCOUNTER — Ambulatory Visit (INDEPENDENT_AMBULATORY_CARE_PROVIDER_SITE_OTHER): Payer: Medicare Other | Admitting: Internal Medicine

## 2017-05-22 VITALS — BP 130/80 | HR 76 | Ht 64.0 in | Wt 170.8 lb

## 2017-05-22 DIAGNOSIS — Z950 Presence of cardiac pacemaker: Secondary | ICD-10-CM | POA: Diagnosis not present

## 2017-05-22 DIAGNOSIS — I495 Sick sinus syndrome: Secondary | ICD-10-CM | POA: Diagnosis not present

## 2017-05-22 DIAGNOSIS — Z79899 Other long term (current) drug therapy: Secondary | ICD-10-CM | POA: Diagnosis not present

## 2017-05-22 DIAGNOSIS — I481 Persistent atrial fibrillation: Secondary | ICD-10-CM | POA: Diagnosis not present

## 2017-05-22 DIAGNOSIS — I4819 Other persistent atrial fibrillation: Secondary | ICD-10-CM

## 2017-05-22 LAB — CUP PACEART REMOTE DEVICE CHECK
Battery Impedance: 135 Ohm
Battery Remaining Longevity: 129 mo
Battery Voltage: 2.79 V
Date Time Interrogation Session: 20190128110713
Implantable Lead Implant Date: 20160816
Implantable Lead Location: 753859
Implantable Lead Model: 4574
Implantable Pulse Generator Implant Date: 20160816
Lead Channel Impedance Value: 494 Ohm
Lead Channel Pacing Threshold Pulse Width: 0.4 ms
Lead Channel Setting Pacing Amplitude: 2.5 V
Lead Channel Setting Pacing Pulse Width: 0.4 ms
Lead Channel Setting Sensing Sensitivity: 5.6 mV
MDC IDC LEAD IMPLANT DT: 20160816
MDC IDC LEAD LOCATION: 753860
MDC IDC MSMT LEADCHNL RA PACING THRESHOLD AMPLITUDE: 0.25 V
MDC IDC MSMT LEADCHNL RA PACING THRESHOLD PULSEWIDTH: 0.4 ms
MDC IDC MSMT LEADCHNL RV IMPEDANCE VALUE: 569 Ohm
MDC IDC MSMT LEADCHNL RV PACING THRESHOLD AMPLITUDE: 0.625 V
MDC IDC MSMT LEADCHNL RV SENSING INTR AMPL: 11.2 mV
MDC IDC SET LEADCHNL RA PACING AMPLITUDE: 2 V
MDC IDC STAT BRADY AP VP PERCENT: 0 %
MDC IDC STAT BRADY AP VS PERCENT: 94 %
MDC IDC STAT BRADY AS VP PERCENT: 0 %
MDC IDC STAT BRADY AS VS PERCENT: 5 %

## 2017-05-22 NOTE — Patient Instructions (Signed)
Medication Instructions: - Your physician recommends that you continue on your current medications as directed. Please refer to the Current Medication list given to you today.  Labwork: - Your physician recommends that you have lab work today: BMP/ CBC/ Magnesium   Procedures/Testing: - none ordered  Follow-Up: - Remote monitoring is used to monitor your Pacemaker of ICD from home. This monitoring reduces the number of office visits required to check your device to one time per year. It allows [place] to keep an eye on the functioning of your device to ensure it is working properly. You are scheduled for a device check from home on 08/20/17. You may send your transmission at any time that day. If you have a wireless device, the transmission will be sent automatically. After your physician reviews your transmission, you will receive a postcard with your next transmission date.  - Your physician wants you to follow-up in: 1 year with Dr. Caryl Comes. You will receive a reminder letter in the mail two months in advance. If you don't receive a letter, please call our office to schedule the follow-up appointment.   Any Additional Special Instructions Will Be Listed Below (If Applicable).     If you need a refill on your cardiac medications before your next appointment, please call your pharmacy.

## 2017-05-22 NOTE — Progress Notes (Signed)
ELECTROPHYSIOLOGY OFFICE NOTE  Patient ID: Chelsey Williams, MRN: [id number], DOB/AGE: 1928-01-28 84 y.o. Admit date: (Not on file) Date of Consult: 05/22/2017  Primary Physician: Clarisse Gouge, MD Primary Cardiologist: new     Chelsey Williams is a 84 y.o. female who is being seen today At her request to establish EP care   HPI Chelsey Williams is a 84 y.o. female  with a history of atrial fibrillation previously seen at [facility] by Dr. Omelia Blackwater. Pacemaker was implanted in [place]. At [facility] she was admitted and started on sotalol. She underwent cardioversion with significant interval improvement. She has had no interval atrial fibrillation.     Echocardiogram 8/16 demonstrated normal LV function mild proximal septal hypertrophy elevated EE prime mean aortic valve gradient 10.7 8/16 catheterization nonobstructive coronary disease  Date Cr K Hgb  7/18 0.64 3.8 12.2         She has significant pain in her back.  This is most limiting.  She has been encouraged not to take Advil which works well.  She takes occasional opiates.  Her PCP notes were reviewed.  She is no longer seeing Dr. Vickki Muff.  No bleeding.  No palpitations.   Thromboembolic risk factors ( age  -2, HTN-1,  Gender-1 ) for a CHADSVASc Score of  4   Past Medical History:  Diagnosis Date  . Anxiety   . Bilateral hearing loss   . Coronary artery disease, non-occlusive    a. LHC 11/2014: R dom, LM nl, mLAD 30-40, dLAD minor irregs, LCx minor irregs, RCA minor irregs w/ associated ectasia  . Depression   . GERD (gastroesophageal reflux disease)   . Glaucoma   . Hyperlipemia   . Hypertension   . Macular degeneration   . OA (osteoarthritis)   . Pacemaker    a. Medtronic Adapta ADDRL1 dual-chamber pacemaker (serial number [id number]) with Medtronic 3846 atrial lead (serial number [id number]) and Medtronic 5076 ventricular lead (serial number [id number]), all implanted 12/08/2014 by Dr. Jacklynn Barnacle at [facility] [phone].      Marland Kitchen Persistent atrial fibrillation ([place])    a. s/p DCCV 02/2016; b. sotalol and Eliquis; c. CHADS2VASc --> 4 (HTN, age x 2, female)  . Sinus node dysfunction [facility])       Surgical History:  Past Surgical History:  Procedure Laterality Date  . CHOLECYSTECTOMY    . knee replacmen    . PACEMAKER IMPLANT    . throid node       Home Meds: Prior to Admission medications   Medication Sig Start Date End Date Taking? Authorizing Provider  albuterol (PROVENTIL HFA;VENTOLIN HFA) 108 (90 Base) MCG/ACT inhaler Inhale into the lungs. 02/21/16 02/20/17 Yes [provider]  apixaban (ELIQUIS) 5 MG TABS tablet Take 5 mg by mouth 2 (two) times daily.  04/06/16  Yes [provider]  cholecalciferol (VITAMIN D) 1000 units tablet Take 1,000 Units by mouth daily.   Yes [provider]  cholestyramine (QUESTRAN) 4 g packet Take by mouth 2 (two) times daily.  11/13/16  Yes [provider]  folic acid (FOLVITE) 1 MG tablet Take 1 mg by mouth daily.   Yes [provider]  hydrALAZINE (APRESOLINE) 10 MG tablet Take 1 tablet (10 mg total) by mouth 3 (three) times daily. 08/16/16  Yes Vaughan Basta, MD  HYDROcodone-acetaminophen (NORCO/VICODIN) 5-325 MG tablet take 1 tablet by mouth every 6 hours if needed for pain 10/31/16  Yes [provider]  ibuprofen (ADVIL,MOTRIN) 200 MG tablet Take  200 mg by mouth every 6 (six) hours as needed for fever or mild pain.    Yes [provider]  Lecithin 1200 MG CAPS Take by mouth.   Yes [provider]  lisinopril (PRINIVIL,ZESTRIL) 20 MG tablet Take 1 tablet (20 mg total) by mouth 2 (two) times daily. 11/07/16  Yes Sainani, Belia Heman, MD  olopatadine (PATANOL) 0.1 % ophthalmic solution Place 1 drop into the right eye 2 (two) times daily.  11/07/16  Yes [provider]  Omega-3 Fatty Acids (FISH OIL) 1200 MG CAPS Take by mouth.   Yes [provider]  omeprazole (PRILOSEC) 20 MG capsule Take  20 mg by mouth daily as needed.    Yes [provider]  psyllium (METAMUCIL) 58.6 % packet Take 1 packet by mouth daily as needed.   Yes [provider]  sotalol (BETAPACE) 80 MG tablet Take 80 mg by mouth every 12 (twelve) hours. 03/24/16  Yes [provider]  vitamin B-12 (CYANOCOBALAMIN) 1000 MCG tablet Take 1,000 mcg by mouth daily.   Yes [provider]    Allergies:  Allergies  Allergen Reactions  . Cephalexin Anaphylaxis    Shock / Unconsciousness  . Diltiazem Hcl Swelling  . Hydrochlorothiazide Other (See Comments)    dizziness dizziness  . Prednisone Shortness Of Breath    SOB  . Felodipine Nausea Only    Gastrointestinal problems, e.g., nausea, vomiting, diarrheaShock / Unconsciousnessmuscle tenderness  . Other Other (See Comments)    A blood pressure med A blood pressure med  . Statins Other (See Comments)  . Amlodipine Other (See Comments) and Palpitations  . Atenolol Palpitations    heart palpitations    Social History   Socioeconomic History  . Marital status: Widowed    Spouse name: Not on file  . Number of children: Not on file  . Years of education: Not on file  . Highest education level: Not on file  Social Needs  . Financial resource strain: Not on file  . Food insecurity - worry: Not on file  . Food insecurity - inability: Not on file  . Transportation needs - medical: Not on file  . Transportation needs - non-medical: Not on file  Occupational History  . Not on file  Tobacco Use  . Smoking status: Former Smoker    Last attempt to quit: 11/15/1999    Years since quitting: 17.5  . Smokeless tobacco: Never Used  Substance and Sexual Activity  . Alcohol use: No  . Drug use: No  . Sexual activity: Not on file  Other Topics Concern  . Not on file  Social History Narrative  . Not on file     Family History  Problem Relation Age of Onset  . Hypertension Mother   . Stroke Mother   . Hypertension Father       ROS:  Please see the history of present illness.     All other systems reviewed and negative.    Physical Exam:  Blood pressure 130/80, pulse 76, height 5' 4"  (1.626 m), weight 170 lb 12 oz (77.5 kg). Well developed and nourished in no acute distress HENT normal Neck supple with JVP-flat Clear Device pocket well healed; without hematoma or erythema.  There is no tethering  Regular rate and rhythm, no murmurs or gallops Abd-soft with active BS No Clubbing cyanosis edema Skin-warm and dry A & Oriented  Grossly normal sensory and motor function Walks with cane     Labs: Cardiac  Enzymes No results for input(s): CKTOTAL, CKMB, TROPONINI in the last 72 hours. CBC Lab Results  Component Value Date   WBC 6.7 11/07/2016   HGB 12.2 11/07/2016   HCT 35.5 11/07/2016   MCV 87.6 11/07/2016   PLT 294 11/07/2016   PROTIME: No results for input(s): LABPROT, INR in the last 72 hours. Chemistry No results for input(s): NA, K, CL, CO2, BUN, CREATININE, CALCIUM, PROT, BILITOT, ALKPHOS, ALT, AST, GLUCOSE in the last 168 hours.  Invalid input(s): LABALBU Lipids No results found for: CHOL, HDL, LDLCALC, TRIG BNP No results found for: PROBNP Thyroid Function Tests: No results for input(s): TSH, T4TOTAL, T3FREE, THYROIDAB in the last 72 hours.  Invalid input(s): FREET3 Miscellaneous No results found for: DDIMER  Radiology/Studies:  No results found.  EKG: Atrial paced at 76 Interval 22/08/40  Assessment and Plan:   Atrial fibrillation-persistent   Sinus node dysfunction  Pacemaker-Medtronic  Hypertension  Back pain  High Risk Medication Surveillance   Blood pressure remains an issue.  She is already making daily decisions regarding hydralazine.  I have encouraged her to do that.  I suggested that she have a frank conversation with her primary care physician about the role of Advil.  She is miserable with her back pain.  I wonder whether the untowards of Advil are  indeed some terrible given the significant benefit pain reduction  No bleeding on apixaban.  We will remeasure renal function    Sotalol dose remains very low.  She has infrequent atrial fibrillation and so we will not adjust it.  We spent more than 50% of our >25 min visit in face to face counseling regarding the above    Virl Axe

## 2017-05-23 ENCOUNTER — Encounter: Payer: Self-pay | Admitting: Cardiology

## 2017-05-23 LAB — CBC WITH DIFFERENTIAL/PLATELET
BASOS ABS: 0.1 10*3/uL (ref 0.0–0.2)
Basos: 1 %
EOS (ABSOLUTE): 0.2 10*3/uL (ref 0.0–0.4)
Eos: 2 %
HEMOGLOBIN: 12.5 g/dL (ref 11.1–15.9)
Hematocrit: 38.1 % (ref 34.0–46.6)
IMMATURE GRANS (ABS): 0 10*3/uL (ref 0.0–0.1)
Immature Granulocytes: 0 %
LYMPHS ABS: 1.5 10*3/uL (ref 0.7–3.1)
LYMPHS: 19 %
MCH: 28.8 pg (ref 26.6–33.0)
MCHC: 32.8 g/dL (ref 31.5–35.7)
MCV: 88 fL (ref 79–97)
MONOCYTES: 13 %
Monocytes Absolute: 1.1 10*3/uL — ABNORMAL HIGH (ref 0.1–0.9)
Neutrophils Absolute: 5.4 10*3/uL (ref 1.4–7.0)
Neutrophils: 65 %
PLATELETS: 339 10*3/uL (ref 150–379)
RBC: 4.34 x10E6/uL (ref 3.77–5.28)
RDW: 14.2 % (ref 12.3–15.4)
WBC: 8.3 10*3/uL (ref 3.4–10.8)

## 2017-05-23 LAB — BASIC METABOLIC PANEL
BUN/Creatinine Ratio: 32 — ABNORMAL HIGH (ref 12–28)
BUN: 20 mg/dL (ref 8–27)
CALCIUM: 9.1 mg/dL (ref 8.7–10.3)
CHLORIDE: 106 mmol/L (ref 96–106)
CO2: 21 mmol/L (ref 20–29)
Creatinine, Ser: 0.62 mg/dL (ref 0.57–1.00)
GFR calc Af Amer: 94 mL/min/{1.73_m2} (ref >59)
GFR calc non Af Amer: 81 mL/min/{1.73_m2} (ref >59)
GLUCOSE: 82 mg/dL (ref 65–99)
POTASSIUM: 4.6 mmol/L (ref 3.5–5.2)
Sodium: 142 mmol/L (ref 134–144)

## 2017-05-23 LAB — MAGNESIUM: MAGNESIUM: 2.1 mg/dL (ref 1.6–2.3)

## 2017-05-24 LAB — CUP PACEART INCLINIC DEVICE CHECK
Battery Impedance: 135 Ohm
Battery Voltage: 2.79 V
Brady Statistic AP VP Percent: 0 %
Brady Statistic AP VS Percent: 94 %
Brady Statistic AS VS Percent: 5 %
Implantable Lead Location: 753859
Implantable Lead Location: 753860
Implantable Lead Model: 4574
Implantable Lead Model: 5076
Implantable Pulse Generator Implant Date: 20160816
Lead Channel Impedance Value: 517 Ohm
Lead Channel Impedance Value: 567 Ohm
Lead Channel Pacing Threshold Amplitude: 0.25 V
Lead Channel Pacing Threshold Pulse Width: 0.4 ms
Lead Channel Sensing Intrinsic Amplitude: 15.67 mV
Lead Channel Sensing Intrinsic Amplitude: 5.6 mV
Lead Channel Setting Pacing Pulse Width: 0.4 ms
MDC IDC LEAD IMPLANT DT: 20160816
MDC IDC LEAD IMPLANT DT: 20160816
MDC IDC MSMT BATTERY REMAINING LONGEVITY: 130 mo
MDC IDC MSMT LEADCHNL RA PACING THRESHOLD PULSEWIDTH: 0.4 ms
MDC IDC MSMT LEADCHNL RV PACING THRESHOLD AMPLITUDE: 0.5 V
MDC IDC SESS DTM: 20190129182712
MDC IDC SET LEADCHNL RA PACING AMPLITUDE: 2 V
MDC IDC SET LEADCHNL RV PACING AMPLITUDE: 2.5 V
MDC IDC SET LEADCHNL RV SENSING SENSITIVITY: 5.6 mV
MDC IDC STAT BRADY AS VP PERCENT: 0 %

## 2017-07-25 ENCOUNTER — Other Ambulatory Visit: Payer: Self-pay

## 2017-07-25 MED ORDER — APIXABAN 5 MG PO TABS: 5.0000 mg | ORAL_TABLET | Freq: Two times a day (BID) | ORAL | 3 refills | Status: AC

## 2017-07-25 NOTE — Telephone Encounter (Signed)
*  STAT* If patient is at the pharmacy, call can be transferred to refill team.   1. Which medications need to be refilled? (please list name of each medication and dose if known) Eliquis 5 mg  2. Which pharmacy/location (including street and city if local pharmacy) is medication to be sent to?* [facility]  3. Do they need a 30 day or 90 day supply? [place]

## 2017-07-25 NOTE — Telephone Encounter (Signed)
Eliquis refilled.

## 2017-08-18 MED ORDER — PANTOPRAZOLE SODIUM 40 MG PO TBEC
40.00 | DELAYED_RELEASE_TABLET | ORAL | Status: DC
Start: 2017-08-18 — End: 2017-08-18

## 2017-08-18 MED ORDER — LIDOCAINE HCL 1 % IJ SOLN
.50 | INTRAMUSCULAR | Status: DC
Start: ? — End: 2017-08-18

## 2017-08-18 MED ORDER — ALBUTEROL SULFATE HFA 108 (90 BASE) MCG/ACT IN AERS
2.00 | INHALATION_SPRAY | RESPIRATORY_TRACT | Status: DC
Start: ? — End: 2017-08-18

## 2017-08-18 MED ORDER — DEXTROSE 50 % IV SOLN
12.50 | INTRAVENOUS | Status: DC
Start: ? — End: 2017-08-18

## 2017-08-18 MED ORDER — HYDRALAZINE HCL 10 MG PO TABS
20.00 | ORAL_TABLET | ORAL | Status: DC
Start: 2017-08-17 — End: 2017-08-18

## 2017-08-18 MED ORDER — LOPERAMIDE HCL 2 MG PO CAPS
2.00 | ORAL_CAPSULE | ORAL | Status: DC
Start: ? — End: 2017-08-18

## 2017-08-18 MED ORDER — DEXAMETHASONE 4 MG PO TABS
12.00 | ORAL_TABLET | ORAL | Status: DC
Start: 2017-08-18 — End: 2017-08-18

## 2017-08-18 MED ORDER — CHOLECALCIFEROL 25 MCG (1000 UT) PO TABS
2000.00 | ORAL_TABLET | ORAL | Status: DC
Start: 2017-08-18 — End: 2017-08-18

## 2017-08-18 MED ORDER — GLUCAGON HCL RDNA (DIAGNOSTIC) 1 MG IJ SOLR
1.00 | INTRAMUSCULAR | Status: DC
Start: ? — End: 2017-08-18

## 2017-08-18 MED ORDER — SOTALOL HCL 80 MG PO TABS
80.00 | ORAL_TABLET | ORAL | Status: DC
Start: 2017-08-17 — End: 2017-08-18

## 2017-08-18 MED ORDER — HYDROCODONE-ACETAMINOPHEN 5-325 MG PO TABS
1.00 | ORAL_TABLET | ORAL | Status: DC
Start: ? — End: 2017-08-18

## 2017-08-18 MED ORDER — INSULIN REGULAR HUMAN 100 UNIT/ML IJ SOLN
0.00 | INTRAMUSCULAR | Status: DC
Start: 2017-08-18 — End: 2017-08-18

## 2017-08-18 MED ORDER — LISINOPRIL 20 MG PO TABS
20.00 | ORAL_TABLET | ORAL | Status: DC
Start: 2017-08-18 — End: 2017-08-18

## 2017-08-20 ENCOUNTER — Ambulatory Visit (INDEPENDENT_AMBULATORY_CARE_PROVIDER_SITE_OTHER): Payer: Medicare Other | Admitting: *Deleted

## 2017-08-20 ENCOUNTER — Telehealth: Payer: Self-pay | Admitting: Cardiology

## 2017-08-20 DIAGNOSIS — I495 Sick sinus syndrome: Secondary | ICD-10-CM | POA: Diagnosis not present

## 2017-08-20 NOTE — Telephone Encounter (Signed)
Confirmed remote transmission w/ pt son.

## 2017-08-21 ENCOUNTER — Encounter: Payer: Medicare Other | Admitting: Internal Medicine

## 2017-08-21 ENCOUNTER — Encounter: Payer: Self-pay | Admitting: Cardiology

## 2017-08-21 DIAGNOSIS — H5462 Unqualified visual loss, left eye, normal vision right eye: Secondary | ICD-10-CM | POA: Insufficient documentation

## 2017-08-21 NOTE — Progress Notes (Signed)
Remote pacemaker transmission.   

## 2017-08-22 LAB — CUP PACEART REMOTE DEVICE CHECK
Battery Impedance: 160 Ohm
Battery Remaining Longevity: 126 mo
Battery Voltage: 2.79 V
Brady Statistic AP VP Percent: 0 %
Brady Statistic AS VP Percent: 0 %
Implantable Lead Implant Date: 20160816
Implantable Lead Model: 4574
Implantable Lead Model: 5076
Implantable Pulse Generator Implant Date: 20160816
Lead Channel Impedance Value: 541 Ohm
Lead Channel Pacing Threshold Amplitude: 0.375 V
Lead Channel Pacing Threshold Pulse Width: 0.4 ms
Lead Channel Setting Pacing Amplitude: 2 V
Lead Channel Setting Pacing Pulse Width: 0.4 ms
MDC IDC LEAD IMPLANT DT: 20160816
MDC IDC LEAD LOCATION: 753859
MDC IDC LEAD LOCATION: 753860
MDC IDC MSMT LEADCHNL RV IMPEDANCE VALUE: 638 Ohm
MDC IDC MSMT LEADCHNL RV PACING THRESHOLD AMPLITUDE: 0.625 V
MDC IDC MSMT LEADCHNL RV PACING THRESHOLD PULSEWIDTH: 0.4 ms
MDC IDC SESS DTM: 20190430022201
MDC IDC SET LEADCHNL RV PACING AMPLITUDE: 2.5 V
MDC IDC SET LEADCHNL RV SENSING SENSITIVITY: 5.6 mV
MDC IDC STAT BRADY AP VS PERCENT: 92 %
MDC IDC STAT BRADY AS VS PERCENT: 7 %

## 2017-10-17 ENCOUNTER — Encounter: Payer: Self-pay | Admitting: Internal Medicine

## 2017-10-17 ENCOUNTER — Ambulatory Visit (INDEPENDENT_AMBULATORY_CARE_PROVIDER_SITE_OTHER): Payer: Medicare Other | Admitting: Internal Medicine

## 2017-10-17 VITALS — BP 164/92 | HR 74 | Temp 98.5°F | Resp 16 | Ht 64.0 in | Wt 163.0 lb

## 2017-10-17 DIAGNOSIS — I10 Essential (primary) hypertension: Secondary | ICD-10-CM | POA: Diagnosis not present

## 2017-10-17 DIAGNOSIS — M159 Polyosteoarthritis, unspecified: Secondary | ICD-10-CM

## 2017-10-17 DIAGNOSIS — I480 Paroxysmal atrial fibrillation: Secondary | ICD-10-CM

## 2017-10-17 DIAGNOSIS — K580 Irritable bowel syndrome with diarrhea: Secondary | ICD-10-CM

## 2017-10-17 DIAGNOSIS — L989 Disorder of the skin and subcutaneous tissue, unspecified: Secondary | ICD-10-CM

## 2017-10-17 DIAGNOSIS — J449 Chronic obstructive pulmonary disease, unspecified: Secondary | ICD-10-CM | POA: Diagnosis not present

## 2017-10-17 DIAGNOSIS — H3530 Unspecified macular degeneration: Secondary | ICD-10-CM | POA: Diagnosis not present

## 2017-10-17 MED ORDER — ALBUTEROL SULFATE HFA 108 (90 BASE) MCG/ACT IN AERS: 2.0000 | INHALATION_SPRAY | Freq: Four times a day (QID) | RESPIRATORY_TRACT | 3 refills | Status: AC | PRN

## 2017-10-17 NOTE — Progress Notes (Signed)
Date:  10/17/2017   Name:  Chelsey Williams   DOB:  November 22, 1927   MRN:  [id number]  New pt to get acquainted. Chief Complaint: Establish Care and Head sore Rash  This is a chronic problem. The affected locations include the scalp. The rash is characterized by itchiness. She was exposed to nothing. Pertinent negatives include no cough, fatigue, fever or shortness of breath. Treatments tried: T-gel. The treatment provided moderate relief.  Hypertension  This is a chronic problem. The problem has been waxing and waning since onset. The problem is resistant. Pertinent negatives include no chest pain, headaches, palpitations or shortness of breath. Past treatments include ACE inhibitors and direct vasodilators (and hydralazine). Improvement on treatment: she take both lisinopril and hydralazine when her pressure is high and when it drops, she stops both.  She says she was told to do this by Dr.Klein.   Atrial fibrillation - noted years ago when she had bradycardia and has pacemaker placed.  She is followed by Dr. Caryl Comes.  She also take Eliquis bid - denies bleeding issues.   Back pain - she has had chronic back pain for years.  No surgery would be helpful.  She has been told not to take Advil but does sometimes take one or two.  She has vicodin to take if the pain is severe - 30 pills usually last most of a year.  COPD - pt is a former smoker.  Has mild SOB at times and uses albuterol MDI about once a day.  She has pulmonary nodules as well but does not see a pulmonologist or have any follow up planned.  Diarrhea - recurrent per patient, often triggered by food. No bleeding or dairy sensitivity.  Uses imodium as needed.  Review of Systems  Constitutional: Negative for chills, fatigue and fever.  HENT: Positive for hearing loss. Negative for trouble swallowing.   Eyes: Positive for visual disturbance (macular degeneration; also recent TA bx done).  Respiratory: Negative for cough, chest tightness,  shortness of breath and wheezing.   Cardiovascular: Negative for chest pain, palpitations and leg swelling.  Gastrointestinal: Negative for abdominal pain.  Musculoskeletal: Positive for back pain and gait problem (uses a cane).  Skin: Positive for rash.  Neurological: Negative for dizziness and headaches.  Psychiatric/Behavioral: Negative for decreased concentration, dysphoric mood and sleep disturbance.    Patient Active Problem List   Diagnosis Date Noted  . Atypical chest pain 11/07/2016  . Labile hypertension 11/07/2016  . Anxiety 11/07/2016  . Palpitations 08/16/2016  . Coronary artery disease, non-occlusive 08/16/2016  . Persistent atrial fibrillation ([place]) 08/16/2016  . Pacemaker 08/16/2016  . Essential hypertension 08/16/2016  . Dilatation of thoracic aorta ([place]) 08/16/2016  . HLD (hyperlipidemia) 08/16/2016  . Macular degeneration 08/16/2016    Prior to Admission medications   Medication Sig Start Date End Date Taking? Authorizing Provider  albuterol (PROVENTIL HFA;VENTOLIN HFA) 108 (90 Base) MCG/ACT inhaler Inhale into the lungs. 02/21/16 10/17/17 Yes [provider]  apixaban (ELIQUIS) 5 MG TABS tablet Take 1 tablet (5 mg total) by mouth 2 (two) times daily. 07/25/17  Yes Deboraha Sprang, MD  Calcium Carbonate-Vitamin D (CALCIUM-VITAMIN D3 PO) Take 1 each by mouth daily.   Yes [provider]  cholecalciferol (VITAMIN D) 1000 units tablet Take 1,000 Units by mouth daily.   Yes [provider]  cholestyramine (QUESTRAN) 4 g packet Take by mouth 2 (two) times daily.  11/13/16  Yes [provider]  folic acid (FOLVITE)  1 MG tablet Take 1 mg by mouth daily.   Yes [provider]  hydrALAZINE (APRESOLINE) 10 MG tablet Take 20 mg by mouth 3 (three) times daily. 02/22/17  Yes [provider]  HYDROcodone-acetaminophen (NORCO/VICODIN) 5-325 MG tablet take 1 tablet by mouth every 6 hours if needed for pain 10/31/16  Yes [provider]  ibuprofen (ADVIL,MOTRIN) 200 MG tablet Take 200 mg by mouth every 6 (six) hours as needed for fever or mild pain.    Yes [provider]  lisinopril (PRINIVIL,ZESTRIL) 20 MG tablet Take 1 tablet (20 mg total) by mouth 2 (two) times daily. 11/07/16  Yes Henreitta Leber, MD  loperamide (IMODIUM) 2 MG capsule Take 2 mg by mouth as needed for diarrhea or loose stools.   Yes [provider]  olopatadine (PATANOL) 0.1 % ophthalmic solution Place 1 drop into the right eye 2 (two) times daily.  11/07/16  Yes [provider]  Omega-3 Fatty Acids (FISH OIL) 1200 MG CAPS Take by mouth.   Yes [provider]  omeprazole (PRILOSEC) 20 MG capsule Take 20 mg by mouth daily as needed.    Yes [provider]  psyllium (METAMUCIL) 58.6 % packet Take 1 packet by mouth daily as needed.   Yes [provider]  sotalol (BETAPACE) 80 MG tablet Take 1 tablet (80 mg total) by mouth every 12 (twelve) hours. 11/14/16  Yes Deboraha Sprang, MD  vitamin B-12 (CYANOCOBALAMIN) 1000 MCG tablet Take 1,000 mcg by mouth daily.   Yes [provider]  Lecithin 1200 MG CAPS Take by mouth.    [provider]    Allergies  Allergen Reactions  . Cephalexin Anaphylaxis    Shock / Unconsciousness  . Diltiazem Hcl Swelling  . Hydrochlorothiazide Other (See Comments)    dizziness dizziness  . Prednisone Shortness Of Breath    SOB  . Felodipine Nausea Only    Gastrointestinal problems, e.g., nausea, vomiting, diarrheaShock / Unconsciousnessmuscle tenderness  . Other Other (See Comments)    A blood pressure med A blood pressure med  . Statins Other (See Comments)  . Amlodipine Other (See Comments) and Palpitations  . Atenolol Palpitations    heart palpitations    Past Surgical History:  Procedure Laterality Date  . CHOLECYSTECTOMY    . knee replacmen    . PACEMAKER IMPLANT    . throid node      Social History   Tobacco Use  . Smoking  status: Former Smoker    Last attempt to quit: 11/15/1999    Years since quitting: 17.9  . Smokeless tobacco: Never Used  Substance Use Topics  . Alcohol use: No  . Drug use: No     Medication list has been reviewed and updated.  Current Meds  Medication Sig  . albuterol (PROVENTIL HFA;VENTOLIN HFA) 108 (90 Base) MCG/ACT inhaler Inhale into the lungs.  Marland Kitchen apixaban (ELIQUIS) 5 MG TABS tablet Take 1 tablet (5 mg total) by mouth 2 (two) times daily.  . Calcium Carbonate-Vitamin D (CALCIUM-VITAMIN D3 PO) Take 1 each by mouth daily.  . cholecalciferol (VITAMIN D) 1000 units tablet Take 1,000 Units by mouth daily.  . cholestyramine (QUESTRAN) 4 g packet Take by mouth 2 (two) times daily.   . folic acid (FOLVITE) 1 MG tablet Take 1 mg by mouth daily.  . hydrALAZINE (APRESOLINE) 10 MG tablet Take 20 mg by mouth 3 (three) times daily.  Marland Kitchen HYDROcodone-acetaminophen (NORCO/VICODIN) 5-325 MG tablet take 1 tablet  by mouth every 6 hours if needed for pain  . ibuprofen (ADVIL,MOTRIN) 200 MG tablet Take 200 mg by mouth every 6 (six) hours as needed for fever or mild pain.   Marland Kitchen lisinopril (PRINIVIL,ZESTRIL) 20 MG tablet Take 1 tablet (20 mg total) by mouth 2 (two) times daily.  Marland Kitchen loperamide (IMODIUM) 2 MG capsule Take 2 mg by mouth as needed for diarrhea or loose stools.  Marland Kitchen olopatadine (PATANOL) 0.1 % ophthalmic solution Place 1 drop into the right eye 2 (two) times daily.   . Omega-3 Fatty Acids (FISH OIL) 1200 MG CAPS Take by mouth.  Marland Kitchen omeprazole (PRILOSEC) 20 MG capsule Take 20 mg by mouth daily as needed.   . psyllium (METAMUCIL) 58.6 % packet Take 1 packet by mouth daily as needed.  . sotalol (BETAPACE) 80 MG tablet Take 1 tablet (80 mg total) by mouth every 12 (twelve) hours.  . vitamin B-12 (CYANOCOBALAMIN) 1000 MCG tablet Take 1,000 mcg by mouth daily.    PHQ 2/9 Scores 10/17/2017  PHQ - 2 Score 0  PHQ- 9 Score 1    Physical Exam  Constitutional: She is oriented to person, place, and time.  She appears well-developed. No distress.  HENT:  Head: Normocephalic and atraumatic.  Cardiovascular: Normal rate, regular rhythm and normal heart sounds. Exam reveals no gallop and no friction rub.  No murmur heard. Pulmonary/Chest: Effort normal. No respiratory distress. She has decreased breath sounds. She has no wheezes. She exhibits no tenderness.  Musculoskeletal: Normal range of motion.  Neurological: She is alert and oriented to person, place, and time.  Skin: Skin is warm and dry.     Psychiatric: She has a normal mood and affect. Her behavior is normal. Thought content normal.  Nursing note and vitals reviewed.   BP (!) 164/92   Pulse 74   Temp 98.5 F (36.9 C) (Oral)   Resp 16   Ht 5' 4"  (1.626 m)   Wt 163 lb (73.9 kg)   SpO2 96%   BMI 27.98 kg/m   Assessment and Plan: 1. Essential hypertension Not controlled - suspect pt is not managing her meds correctly Will need to review Dr. Olin Pia notes  2. Macular degeneration of left eye, unspecified type Followed by [facility] Can not drive - uses [employer] Son works full time  3. Chronic obstructive pulmonary disease, unspecified COPD type (HCC) Minimal sx Needs Pneumovax - albuterol (PROVENTIL HFA;VENTOLIN HFA) 108 (90 Base) MCG/ACT inhaler; Inhale 2 puffs into the lungs every 6 (six) hours as needed for wheezing or shortness of breath.  Dispense: 3 each; Refill: 3  4. Generalized osteoarthritis of multiple sites Will allow hydrocodone maximum #30 per 6 months  5. Irritable bowel syndrome with diarrhea Continue imodium prn  6. Episodic atrial fibrillation (HCC) On DOAC and sotalol Appears to be in SR today  7. Scalp lesion Continue using T-gel If sx flare again, will need dermatology referral   Meds ordered this encounter  Medications  . albuterol (PROVENTIL HFA;VENTOLIN HFA) 108 (90 Base) MCG/ACT inhaler    Sig: Inhale 2 puffs into the lungs every 6 (six) hours as needed for wheezing or shortness of breath.      Dispense:  3 each    Refill:  3    [facility]air    Partially dictated using Editor, commissioning. Any errors are unintentional.  Halina Maidens, MD [facility]  10/17/2017   There are no diagnoses linked to this encounter.

## 2017-10-28 ENCOUNTER — Other Ambulatory Visit: Payer: Self-pay | Admitting: Internal Medicine

## 2017-11-07 ENCOUNTER — Ambulatory Visit: Payer: Medicare Other

## 2017-11-07 ENCOUNTER — Telehealth: Payer: Self-pay

## 2017-11-07 NOTE — Telephone Encounter (Signed)
Pt NS'd for 11/07/17 AWV with [facility]. Called pt to resched AWV. LVM requesting returned call.

## 2017-11-19 ENCOUNTER — Telehealth: Payer: Self-pay | Admitting: Cardiology

## 2017-11-19 ENCOUNTER — Ambulatory Visit (INDEPENDENT_AMBULATORY_CARE_PROVIDER_SITE_OTHER): Payer: Medicare Other | Admitting: *Deleted

## 2017-11-19 DIAGNOSIS — I495 Sick sinus syndrome: Secondary | ICD-10-CM | POA: Diagnosis not present

## 2017-11-19 NOTE — Telephone Encounter (Signed)
LMOVM reminding pt to send remote transmission.   

## 2017-11-20 ENCOUNTER — Encounter: Payer: Self-pay | Admitting: Cardiology

## 2017-11-20 NOTE — Progress Notes (Signed)
Remote pacemaker transmission.   

## 2017-11-23 LAB — CUP PACEART REMOTE DEVICE CHECK
Battery Impedance: 160 Ohm
Battery Remaining Longevity: 126 mo
Brady Statistic AP VP Percent: 0 %
Brady Statistic AS VP Percent: 0 %
Brady Statistic AS VS Percent: 13 %
Date Time Interrogation Session: 20190731030831
Implantable Lead Implant Date: 20160816
Implantable Lead Location: 753860
Implantable Lead Model: 4574
Implantable Pulse Generator Implant Date: 20160816
Lead Channel Pacing Threshold Amplitude: 0.25 V
Lead Channel Pacing Threshold Amplitude: 0.625 V
Lead Channel Pacing Threshold Pulse Width: 0.4 ms
Lead Channel Sensing Intrinsic Amplitude: 16 mV
Lead Channel Setting Pacing Amplitude: 2 V
Lead Channel Setting Pacing Pulse Width: 0.4 ms
MDC IDC LEAD IMPLANT DT: 20160816
MDC IDC LEAD LOCATION: 753859
MDC IDC MSMT BATTERY VOLTAGE: 2.79 V
MDC IDC MSMT LEADCHNL RA IMPEDANCE VALUE: 501 Ohm
MDC IDC MSMT LEADCHNL RV IMPEDANCE VALUE: 636 Ohm
MDC IDC MSMT LEADCHNL RV PACING THRESHOLD PULSEWIDTH: 0.4 ms
MDC IDC SET LEADCHNL RV PACING AMPLITUDE: 2.5 V
MDC IDC SET LEADCHNL RV SENSING SENSITIVITY: 5.6 mV
MDC IDC STAT BRADY AP VS PERCENT: 87 %

## 2018-01-23 ENCOUNTER — Emergency Department: Payer: Medicare Other

## 2018-01-23 ENCOUNTER — Encounter: Payer: Self-pay | Admitting: Emergency Medicine

## 2018-01-23 ENCOUNTER — Other Ambulatory Visit: Payer: Self-pay

## 2018-01-23 ENCOUNTER — Emergency Department
Admission: EM | Admit: 2018-01-23 | Discharge: 2018-01-23 | Disposition: A | Discharge status: Home / Self Care | Payer: Medicare Other | Attending: Emergency Medicine | Admitting: Emergency Medicine

## 2018-01-23 DIAGNOSIS — S40011A Contusion of right shoulder, initial encounter: Secondary | ICD-10-CM | POA: Diagnosis not present

## 2018-01-23 DIAGNOSIS — Z96659 Presence of unspecified artificial knee joint: Secondary | ICD-10-CM | POA: Diagnosis not present

## 2018-01-23 DIAGNOSIS — Z87891 Personal history of nicotine dependence: Secondary | ICD-10-CM | POA: Insufficient documentation

## 2018-01-23 DIAGNOSIS — I10 Essential (primary) hypertension: Secondary | ICD-10-CM | POA: Insufficient documentation

## 2018-01-23 DIAGNOSIS — Y92019 Unspecified place in single-family (private) house as the place of occurrence of the external cause: Secondary | ICD-10-CM | POA: Insufficient documentation

## 2018-01-23 DIAGNOSIS — Z950 Presence of cardiac pacemaker: Secondary | ICD-10-CM | POA: Insufficient documentation

## 2018-01-23 DIAGNOSIS — S4991XA Unspecified injury of right shoulder and upper arm, initial encounter: Secondary | ICD-10-CM | POA: Diagnosis present

## 2018-01-23 DIAGNOSIS — J449 Chronic obstructive pulmonary disease, unspecified: Secondary | ICD-10-CM | POA: Diagnosis not present

## 2018-01-23 DIAGNOSIS — Y999 Unspecified external cause status: Secondary | ICD-10-CM | POA: Diagnosis not present

## 2018-01-23 DIAGNOSIS — R0789 Other chest pain: Secondary | ICD-10-CM | POA: Diagnosis not present

## 2018-01-23 DIAGNOSIS — Y9389 Activity, other specified: Secondary | ICD-10-CM | POA: Diagnosis not present

## 2018-01-23 DIAGNOSIS — I2510 Atherosclerotic heart disease of native coronary artery without angina pectoris: Secondary | ICD-10-CM | POA: Insufficient documentation

## 2018-01-23 DIAGNOSIS — W0110XA Fall on same level from slipping, tripping and stumbling with subsequent striking against unspecified object, initial encounter: Secondary | ICD-10-CM | POA: Diagnosis not present

## 2018-01-23 DIAGNOSIS — Z7901 Long term (current) use of anticoagulants: Secondary | ICD-10-CM | POA: Insufficient documentation

## 2018-01-23 DIAGNOSIS — S0990XA Unspecified injury of head, initial encounter: Secondary | ICD-10-CM | POA: Diagnosis not present

## 2018-01-23 DIAGNOSIS — Z79899 Other long term (current) drug therapy: Secondary | ICD-10-CM | POA: Diagnosis not present

## 2018-01-23 DIAGNOSIS — W19XXXA Unspecified fall, initial encounter: Secondary | ICD-10-CM

## 2018-01-23 DIAGNOSIS — Y92009 Unspecified place in unspecified non-institutional (private) residence as the place of occurrence of the external cause: Secondary | ICD-10-CM

## 2018-01-23 MED ORDER — HYDROCODONE-ACETAMINOPHEN 5-325 MG PO TABS: 1.0000 | ORAL_TABLET | Freq: Three times a day (TID) | ORAL | 0 refills | Status: AC | PRN

## 2018-01-23 MED ORDER — HYDROCODONE-ACETAMINOPHEN 5-325 MG PO TABS
1.0000 | ORAL_TABLET | Freq: Once | ORAL | Status: AC
  Administered 2018-01-23: 1 via ORAL
  Filled 2018-01-23: qty 1

## 2018-01-23 NOTE — Discharge Instructions (Signed)
Your exam, x-rays, and CT scan are essentially normal. You may be sore for a few days. Take OTC Tylenol or hydrocodone as needed for pain. Follow-up with your provider as needed.

## 2018-01-23 NOTE — ED Triage Notes (Signed)
Pt presents to ED via EMS from home. Pt fell while walking down the hallway. Pt states the rubber toe of her shoe got hung on the wood floor. Pt states she landed on her right side and is now having right sided shoulder and rib pain. Denies loc. No obvious bruising or injury noted at this time.

## 2018-01-23 NOTE — ED Provider Notes (Signed)
[facility] Emergency Department Provider Note ____________________________________________  Time seen: 2140  I have reviewed the triage vital signs and the nursing notes.  HISTORY  Chief Complaint  Fall  HPI Makaylen Villamar is a 84 y.o. female with a history of HTN, OA,and inplanted pacemaker, present to the ED, via EMS from home, accompanied by her adult son.  She describes a mechanical fall while walking down the hall with home.  She reports that her rubber toe of her tennis shoe, got hung on the wood floor causing her the fall.  She describes landing primarily on her right side.  She thinks she bumped her head, but denies any loss of consciousness.  Her son heard the fall in the other room, but did not witness the fall.  She presents now with pain to the right shoulder, right ribs, and right neck.  She is also concerned for a concussion, decided that she takes blood thinners.  She denies any nausea, vomiting, dizziness, headache, or chest pain.  Past Medical History:  Diagnosis Date  . Anxiety   . Atypical chest pain 11/07/2016  . Bilateral hearing loss   . Coronary artery disease, non-occlusive    a. LHC 11/2014: R dom, LM nl, mLAD 30-40, dLAD minor irregs, LCx minor irregs, RCA minor irregs w/ associated ectasia  . Depression   . GERD (gastroesophageal reflux disease)   . Glaucoma   . Hyperlipemia   . Hypertension   . Macular degeneration   . OA (osteoarthritis)   . Pacemaker    a. Medtronic Adapta ADDRL1 dual-chamber pacemaker (serial number [id number]) with Medtronic 1884 atrial lead (serial number [id number]) and Medtronic 5076 ventricular lead (serial number [id number]), all implanted 12/08/2014 by Dr. Jacklynn Barnacle at [facility] [phone].    Marland Kitchen Palpitations 08/16/2016  . Persistent atrial fibrillation    a. s/p DCCV 02/2016; b. sotalol and Eliquis; c. CHADS2VASc --> 4 (HTN, age x 2, female)  . Sinus node dysfunction [facility])     Patient Active Problem List    Diagnosis Date Noted  . COPD (chronic obstructive pulmonary disease) ([place]) 10/17/2017  . Irritable bowel syndrome with diarrhea 10/17/2017  . Scalp lesion 10/17/2017  . Vision loss of left eye 08/21/2017  . Labile hypertension 11/07/2016  . Anxiety 11/07/2016  . Coronary artery disease, non-occlusive 08/16/2016  . Episodic atrial fibrillation ([place]) 08/16/2016  . Pacemaker 08/16/2016  . Essential hypertension 08/16/2016  . Dilatation of thoracic aorta ([place]) 08/16/2016  . HLD (hyperlipidemia) 08/16/2016  . Macular degeneration 08/16/2016  . Anticoagulant long-term use 03/24/2016  . AV block 03/24/2016  . Bilateral hearing loss 11/06/2014  . History of thyroid nodule 11/06/2014  . Nonexudative senile macular degeneration of retina 03/31/2013  . Pseudophakia 03/31/2013  . PVD (posterior vitreous detachment), both eyes 03/31/2013  . Generalized osteoarthritis of multiple sites 02/05/2004    Past Surgical History:  Procedure Laterality Date  . CHOLECYSTECTOMY    . knee replacmen    . PACEMAKER IMPLANT  2014  . thyroid nodule      Prior to Admission medications   Medication Sig Start Date End Date Taking? Authorizing Provider  albuterol (PROVENTIL HFA;VENTOLIN HFA) 108 (90 Base) MCG/ACT inhaler Inhale 2 puffs into the lungs every 6 (six) hours as needed for wheezing or shortness of breath. 10/17/17 06/13/19  Glean Hess, MD  apixaban (ELIQUIS) 5 MG TABS tablet Take 1 tablet (5 mg total) by mouth 2 (two) times daily. 07/25/17   Deboraha Sprang, MD  Calcium  Carbonate-Vitamin D (CALCIUM-VITAMIN D3 PO) Take 1 each by mouth daily.    [provider]  cholecalciferol (VITAMIN D) 1000 units tablet Take 1,000 Units by mouth daily.    [provider]  cholestyramine (QUESTRAN) 4 g packet Take by mouth 2 (two) times daily.  11/13/16   [provider]  folic acid (FOLVITE) 1 MG tablet Take 1 mg by mouth daily.    [provider]  hydrALAZINE (APRESOLINE)  10 MG tablet Take 20 mg by mouth 3 (three) times daily. 02/22/17   [provider]  HYDROcodone-acetaminophen (NORCO/VICODIN) 5-325 MG tablet take 1 tablet by mouth every 6 hours if needed for pain 10/31/16   [provider]  ibuprofen (ADVIL,MOTRIN) 200 MG tablet Take 200 mg by mouth every 6 (six) hours as needed for fever or mild pain.     [provider]  Lecithin 1200 MG CAPS Take by mouth.    [provider]  lisinopril (PRINIVIL,ZESTRIL) 20 MG tablet Take 1 tablet (20 mg total) by mouth 2 (two) times daily. 11/07/16   Henreitta Leber, MD  loperamide (IMODIUM) 2 MG capsule Take 2 mg by mouth as needed for diarrhea or loose stools.    [provider]  olopatadine (PATANOL) 0.1 % ophthalmic solution Place 1 drop into the right eye 2 (two) times daily.  11/07/16   [provider]  Omega-3 Fatty Acids (FISH OIL) 1200 MG CAPS Take by mouth.    [provider]  omeprazole (PRILOSEC) 20 MG capsule Take 20 mg by mouth daily as needed.     [provider]  psyllium (METAMUCIL) 58.6 % packet Take 1 packet by mouth daily as needed.    [provider]  sotalol (BETAPACE) 80 MG tablet TAKE 1 TABLET EVERY 12 HOURS 10/29/17   Deboraha Sprang, MD  vitamin B-12 (CYANOCOBALAMIN) 1000 MCG tablet Take 1,000 mcg by mouth daily.    [provider]    Allergies Cephalexin; Diltiazem hcl; Hydrochlorothiazide; Prednisone; Felodipine; Other; Statins; Amlodipine; and Atenolol  Family History  Problem Relation Age of Onset  . Hypertension Mother   . Stroke Mother   . Hypertension Father     Social History Social History   Tobacco Use  . Smoking status: Former Smoker    Last attempt to quit: 11/15/1999    Years since quitting: 18.2  . Smokeless tobacco: Never Used  Substance Use Topics  . Alcohol use: No  . Drug use: No    Review of Systems  Constitutional: Negative for fever. Eyes: Negative for visual changes. ENT:  Negative for sore throat. Cardiovascular: Negative for chest pain. Respiratory: Negative for shortness of breath. Gastrointestinal: Negative for abdominal pain, vomiting and diarrhea. Genitourinary: Negative for dysuria. Musculoskeletal: Positive for neck, upper back, right shoulder pain. Skin: Negative for rash. Neurological: Negative for headaches, focal weakness or numbness. ____________________________________________  PHYSICAL EXAM:  VITAL SIGNS: ED Triage Vitals  Enc Vitals Group     BP 01/23/18 2108 (!) 170/79     Pulse Rate 01/23/18 2108 72     Resp 01/23/18 2108 20     Temp 01/23/18 2108 98.3 F (36.8 C)     Temp Source 01/23/18 2108 Oral     SpO2 01/23/18 2108 100 %     Weight 01/23/18 2109 166 lb (75.3 kg)     Height 01/23/18 2109 5' 4"  (1.626 m)     Head Circumference --      Peak Flow --  Pain Score 01/23/18 2109 9     Pain Loc --      Pain Edu? --      Excl. in [place]? --     Constitutional: Alert and oriented. Well appearing and in no distress. GCS= 15 Head: Normocephalic and atraumatic. Eyes: Conjunctivae are normal. PERRL. Normal extraocular movements Ears: hearing aids in place bilaterally Nose: No congestion/rhinorrhea/epistaxis. Mouth/Throat: Mucous membranes are moist. Neck: Supple.Normal ROM without midline tenderness, spasm, or deformity. No distracting muscle tenderness.  Cardiovascular: Normal rate, regular rhythm. Normal distal pulses. Respiratory: Normal respiratory effort. No wheezes/rales/rhonchi.  No obvious chest wall deformity, disc location, or ecchymosis.  Patient without significant tenderness to palpation to the lateral chest wall.  She localizes tenderness to the anterior chest wall under her breast.  Right shoulder is without any obvious deformity, dislocation, or sulcus sign.  Patient with normal passive range of motion to the shoulder. Gastrointestinal: Soft and nontender. No distention. Normal bowel sounds noted.  Musculoskeletal:  Cranial nerves II through XII grossly intact.  Nontender with normal gross sensation. Normal speech and language. No gross focal neurologic deficits are appreciated. Skin:  Skin is warm, dry and intact. No rash noted. Psychiatric: Mood and affect are normal. Patient exhibits appropriate insight and judgment. ___________________________________________   RADIOLOGY  CT Head w/o CM IMPRESSION: No evidence of acute intracranial abnormality.  Atrophy with small vessel ischemic changes.  Cervical Spine IMPRESSION: No fracture or dislocation is seen.  Right Rib Detail w/ CXR IMPRESSION: No evidence of acute cardiopulmonary disease.  No displaced right rib fracture is seen.  Right Shoulder IMPRESSION: Negative. ____________________________________________  PROCEDURES  Procedures Norco 5-325 mg PO ____________________________________________  INITIAL IMPRESSION / ASSESSMENT AND PLAN / ED COURSE  Geriatric patient with ED evaluation of injuries following mechanical fall at home.  The patient's exam is overall benign.  Patient had concerns for injury to the right shoulder and rib and particular.  She and her adult son are reassured by the negative CT imaging of the head, as well as the negative cervical, rib, and right shoulder films.  Patient is discharged with instructions to take over-the-counter Tylenol as needed.  She is given a prescription for #6 hydrocodone to take as needed for moderate to severe pain.  She will follow with her primary care provider for ongoing symptoms.  I reviewed the patient's prescription history over the last 12 months in the multi-state controlled substances database(s) that includes [place], [place], [place], [place], [place], [place], [place], [place], [place], [place], [place], [place], [place], and [place].  Results were notable for no current prescriptions. ____________________________________________  FINAL  CLINICAL IMPRESSION(S) / ED DIAGNOSES  Final diagnoses:  Fall in home, initial encounter  Contusion of right shoulder, initial encounter  Acute chest wall pain      Cathryne Mancebo, Dannielle Karvonen, PA-C 01/23/18 2334    Orbie Pyo, MD 01/26/18 2312

## 2018-01-28 ENCOUNTER — Telehealth: Payer: Self-pay | Admitting: Internal Medicine

## 2018-01-28 NOTE — Telephone Encounter (Signed)
°  1. Has your device fired? No   2. Is you device beeping? No   3. Are you experiencing draining or swelling at device site? No   4. Are you calling to see if we received your device transmission? No   5. Have you passed out? No   Patient fell and went to the ED .  She says her device doesn't feel as loose as it did before and she wants to know if she needs to send a status transmission.  Please call     Please route to [place]

## 2018-01-28 NOTE — Telephone Encounter (Signed)
Spoke with patient's son- Ms. Reddinger did not fall directly on the device but they will send a transmission today for review. I will not call if device function is normal- he verbalizes understanding.

## 2018-01-28 NOTE — Telephone Encounter (Signed)
Transmission received. Normal device function. No recent episodes.

## 2018-02-18 ENCOUNTER — Telehealth: Payer: Self-pay | Admitting: Cardiology

## 2018-02-18 ENCOUNTER — Ambulatory Visit: Payer: Medicare Other | Admitting: Internal Medicine

## 2018-02-18 ENCOUNTER — Encounter: Payer: Medicare Other | Admitting: *Deleted

## 2018-02-18 NOTE — Telephone Encounter (Signed)
Confirmed remote transmission w/ pt son.

## 2018-02-19 ENCOUNTER — Encounter: Payer: Self-pay | Admitting: Cardiology

## 2018-02-19 ENCOUNTER — Ambulatory Visit (INDEPENDENT_AMBULATORY_CARE_PROVIDER_SITE_OTHER): Payer: Medicare Other | Admitting: *Deleted

## 2018-02-19 DIAGNOSIS — I495 Sick sinus syndrome: Secondary | ICD-10-CM

## 2018-02-20 NOTE — Progress Notes (Signed)
Remote pacemaker transmission.   

## 2018-04-20 LAB — CUP PACEART REMOTE DEVICE CHECK
Battery Impedance: 183 Ohm
Battery Remaining Longevity: 121 mo
Battery Voltage: 2.79 V
Brady Statistic AP VP Percent: 0 %
Brady Statistic AP VS Percent: 88 %
Brady Statistic AS VP Percent: 0 %
Brady Statistic AS VS Percent: 11 %
Implantable Lead Implant Date: 20160816
Implantable Lead Implant Date: 20160816
Implantable Lead Location: 753859
Implantable Lead Model: 4574
Implantable Lead Model: 5076
Implantable Pulse Generator Implant Date: 20160816
Lead Channel Impedance Value: 509 Ohm
Lead Channel Impedance Value: 607 Ohm
Lead Channel Pacing Threshold Amplitude: 0.375 V
Lead Channel Pacing Threshold Amplitude: 0.75 V
Lead Channel Pacing Threshold Pulse Width: 0.4 ms
Lead Channel Setting Pacing Amplitude: 2 V
Lead Channel Setting Pacing Amplitude: 2.5 V
Lead Channel Setting Pacing Pulse Width: 0.4 ms
Lead Channel Setting Sensing Sensitivity: 5.6 mV
MDC IDC LEAD LOCATION: 753860
MDC IDC MSMT LEADCHNL RV PACING THRESHOLD PULSEWIDTH: 0.4 ms
MDC IDC SESS DTM: 20191030033342

## 2018-06-03 ENCOUNTER — Encounter: Payer: Self-pay | Admitting: Cardiology

## 2018-06-12 ENCOUNTER — Other Ambulatory Visit: Payer: Self-pay

## 2018-06-12 ENCOUNTER — Inpatient Hospital Stay
Admission: EM | Admit: 2018-06-12 | Discharge: 2018-06-16 | DRG: 392 | Disposition: A | Discharge status: Home / Self Care | Payer: Medicare Other | Attending: Internal Medicine | Admitting: Internal Medicine

## 2018-06-12 DIAGNOSIS — Z888 Allergy status to other drugs, medicaments and biological substances status: Secondary | ICD-10-CM

## 2018-06-12 DIAGNOSIS — K219 Gastro-esophageal reflux disease without esophagitis: Secondary | ICD-10-CM | POA: Diagnosis present

## 2018-06-12 DIAGNOSIS — Z79899 Other long term (current) drug therapy: Secondary | ICD-10-CM

## 2018-06-12 DIAGNOSIS — Z7901 Long term (current) use of anticoagulants: Secondary | ICD-10-CM

## 2018-06-12 DIAGNOSIS — Z8249 Family history of ischemic heart disease and other diseases of the circulatory system: Secondary | ICD-10-CM

## 2018-06-12 DIAGNOSIS — Z9049 Acquired absence of other specified parts of digestive tract: Secondary | ICD-10-CM

## 2018-06-12 DIAGNOSIS — K5900 Constipation, unspecified: Secondary | ICD-10-CM | POA: Diagnosis present

## 2018-06-12 DIAGNOSIS — R109 Unspecified abdominal pain: Secondary | ICD-10-CM | POA: Diagnosis not present

## 2018-06-12 DIAGNOSIS — Z881 Allergy status to other antibiotic agents status: Secondary | ICD-10-CM

## 2018-06-12 DIAGNOSIS — K449 Diaphragmatic hernia without obstruction or gangrene: Secondary | ICD-10-CM | POA: Diagnosis present

## 2018-06-12 DIAGNOSIS — G8929 Other chronic pain: Secondary | ICD-10-CM | POA: Diagnosis present

## 2018-06-12 DIAGNOSIS — R112 Nausea with vomiting, unspecified: Secondary | ICD-10-CM

## 2018-06-12 DIAGNOSIS — R4182 Altered mental status, unspecified: Secondary | ICD-10-CM | POA: Diagnosis not present

## 2018-06-12 DIAGNOSIS — R1031 Right lower quadrant pain: Secondary | ICD-10-CM | POA: Diagnosis present

## 2018-06-12 DIAGNOSIS — R062 Wheezing: Secondary | ICD-10-CM

## 2018-06-12 DIAGNOSIS — K567 Ileus, unspecified: Secondary | ICD-10-CM | POA: Diagnosis not present

## 2018-06-12 DIAGNOSIS — F419 Anxiety disorder, unspecified: Secondary | ICD-10-CM | POA: Diagnosis present

## 2018-06-12 DIAGNOSIS — Z951 Presence of aortocoronary bypass graft: Secondary | ICD-10-CM

## 2018-06-12 DIAGNOSIS — K838 Other specified diseases of biliary tract: Secondary | ICD-10-CM | POA: Diagnosis present

## 2018-06-12 DIAGNOSIS — Z974 Presence of external hearing-aid: Secondary | ICD-10-CM | POA: Diagnosis not present

## 2018-06-12 DIAGNOSIS — M150 Primary generalized (osteo)arthritis: Secondary | ICD-10-CM | POA: Diagnosis present

## 2018-06-12 DIAGNOSIS — R1084 Generalized abdominal pain: Secondary | ICD-10-CM | POA: Diagnosis not present

## 2018-06-12 DIAGNOSIS — K529 Noninfective gastroenteritis and colitis, unspecified: Secondary | ICD-10-CM | POA: Diagnosis present

## 2018-06-12 DIAGNOSIS — R410 Disorientation, unspecified: Secondary | ICD-10-CM | POA: Diagnosis present

## 2018-06-12 DIAGNOSIS — I10 Essential (primary) hypertension: Secondary | ICD-10-CM | POA: Diagnosis present

## 2018-06-12 DIAGNOSIS — I2510 Atherosclerotic heart disease of native coronary artery without angina pectoris: Secondary | ICD-10-CM | POA: Diagnosis present

## 2018-06-12 DIAGNOSIS — H3530 Unspecified macular degeneration: Secondary | ICD-10-CM | POA: Diagnosis present

## 2018-06-12 DIAGNOSIS — E876 Hypokalemia: Secondary | ICD-10-CM | POA: Diagnosis present

## 2018-06-12 DIAGNOSIS — E785 Hyperlipidemia, unspecified: Secondary | ICD-10-CM | POA: Diagnosis present

## 2018-06-12 DIAGNOSIS — Z87891 Personal history of nicotine dependence: Secondary | ICD-10-CM

## 2018-06-12 DIAGNOSIS — H5462 Unqualified visual loss, left eye, normal vision right eye: Secondary | ICD-10-CM | POA: Diagnosis present

## 2018-06-12 DIAGNOSIS — Z823 Family history of stroke: Secondary | ICD-10-CM

## 2018-06-12 DIAGNOSIS — Z9851 Tubal ligation status: Secondary | ICD-10-CM | POA: Diagnosis not present

## 2018-06-12 DIAGNOSIS — H9193 Unspecified hearing loss, bilateral: Secondary | ICD-10-CM | POA: Diagnosis present

## 2018-06-12 DIAGNOSIS — I4819 Other persistent atrial fibrillation: Secondary | ICD-10-CM | POA: Diagnosis present

## 2018-06-12 DIAGNOSIS — Z950 Presence of cardiac pacemaker: Secondary | ICD-10-CM

## 2018-06-12 DIAGNOSIS — Z79891 Long term (current) use of opiate analgesic: Secondary | ICD-10-CM

## 2018-06-12 LAB — COMPREHENSIVE METABOLIC PANEL
ALT: 11 U/L (ref 0–44)
ANION GAP: 6 (ref 5–15)
AST: 22 U/L (ref 15–41)
Albumin: 2.8 g/dL — ABNORMAL LOW (ref 3.5–5.0)
Alkaline Phosphatase: 45 U/L (ref 38–126)
BUN: 15 mg/dL (ref 8–23)
CO2: 20 mmol/L — ABNORMAL LOW (ref 22–32)
Calcium: 7.3 mg/dL — ABNORMAL LOW (ref 8.9–10.3)
Chloride: 114 mmol/L — ABNORMAL HIGH (ref 98–111)
Creatinine, Ser: 0.56 mg/dL (ref 0.44–1.00)
GFR calc non Af Amer: >60 mL/min (ref >60)
Glucose, Bld: 86 mg/dL (ref 70–99)
Potassium: 3.3 mmol/L — ABNORMAL LOW (ref 3.5–5.1)
Sodium: 140 mmol/L (ref 135–145)
Total Bilirubin: 0.5 mg/dL (ref 0.3–1.2)
Total Protein: 5.3 g/dL — ABNORMAL LOW (ref 6.5–8.1)

## 2018-06-12 LAB — URINALYSIS, COMPLETE (UACMP) WITH MICROSCOPIC
Bacteria, UA: NONE SEEN
Bilirubin Urine: NEGATIVE
Glucose, UA: NEGATIVE mg/dL
Ketones, ur: 20 mg/dL — AB
Leukocytes,Ua: NEGATIVE
Nitrite: NEGATIVE
PH: 5 (ref 5.0–8.0)
Protein, ur: NEGATIVE mg/dL
Specific Gravity, Urine: >1.046 — ABNORMAL HIGH (ref 1.005–1.030)

## 2018-06-12 LAB — CBC WITH DIFFERENTIAL/PLATELET
Abs Immature Granulocytes: 0.07 10*3/uL (ref 0.00–0.07)
BASOS ABS: 0.1 10*3/uL (ref 0.0–0.1)
Basophils Relative: 0 %
Eosinophils Absolute: 0.1 10*3/uL (ref 0.0–0.5)
Eosinophils Relative: 1 %
HCT: 42.8 % (ref 36.0–46.0)
Hemoglobin: 13.5 g/dL (ref 12.0–15.0)
Immature Granulocytes: 1 %
Lymphocytes Relative: 9 %
Lymphs Abs: 1.2 10*3/uL (ref 0.7–4.0)
MCH: 28 pg (ref 26.0–34.0)
MCHC: 31.5 g/dL (ref 30.0–36.0)
MCV: 88.8 fL (ref 80.0–100.0)
Monocytes Absolute: 1.2 10*3/uL — ABNORMAL HIGH (ref 0.1–1.0)
Monocytes Relative: 8 %
NRBC: 0 % (ref 0.0–0.2)
Neutro Abs: 11.1 10*3/uL — ABNORMAL HIGH (ref 1.7–7.7)
Neutrophils Relative %: 81 %
PLATELETS: 354 10*3/uL (ref 150–400)
RBC: 4.82 MIL/uL (ref 3.87–5.11)
RDW: 14.2 % (ref 11.5–15.5)
WBC: 13.7 10*3/uL — ABNORMAL HIGH (ref 4.0–10.5)

## 2018-06-12 MED ORDER — SODIUM CHLORIDE 0.9 % IV SOLN
INTRAVENOUS | Status: AC
  Administered 2018-06-12 – 2018-06-13 (×4): via INTRAVENOUS

## 2018-06-12 MED ORDER — METOCLOPRAMIDE HCL 5 MG/ML IJ SOLN
5.0000 mg | Freq: Once | INTRAMUSCULAR | Status: AC
  Administered 2018-06-12: 5 mg via INTRAVENOUS
  Filled 2018-06-12: qty 2

## 2018-06-12 MED ORDER — CALCIUM CARBONATE ANTACID 500 MG PO CHEW
1.0000 | CHEWABLE_TABLET | Freq: Once | ORAL | Status: AC
  Filled 2018-06-12: qty 1

## 2018-06-12 MED ORDER — HYDRALAZINE HCL 10 MG PO TABS
20.0000 mg | ORAL_TABLET | Freq: Three times a day (TID) | ORAL | Status: DC
  Administered 2018-06-13 – 2018-06-16 (×7): 20 mg via ORAL
  Filled 2018-06-12 (×14): qty 2

## 2018-06-12 MED ORDER — HYDROCODONE-ACETAMINOPHEN 5-325 MG PO TABS
1.0000 | ORAL_TABLET | Freq: Four times a day (QID) | ORAL | Status: DC | PRN
  Administered 2018-06-12 – 2018-06-15 (×5): 1 via ORAL
  Filled 2018-06-12 (×6): qty 1

## 2018-06-12 MED ORDER — ONDANSETRON HCL 4 MG/2ML IJ SOLN
4.0000 mg | Freq: Once | INTRAMUSCULAR | Status: AC
  Administered 2018-06-12: 4 mg via INTRAVENOUS
  Filled 2018-06-12: qty 2

## 2018-06-12 MED ORDER — ONDANSETRON HCL 4 MG/2ML IJ SOLN
4.0000 mg | Freq: Once | INTRAMUSCULAR | Status: AC
  Administered 2018-06-12: 4 mg via INTRAVENOUS

## 2018-06-12 MED ORDER — LISINOPRIL 20 MG PO TABS
40.0000 mg | ORAL_TABLET | Freq: Every day | ORAL | Status: DC
  Administered 2018-06-12 – 2018-06-16 (×4): 40 mg via ORAL
  Filled 2018-06-12: qty 4
  Filled 2018-06-12 (×4): qty 2

## 2018-06-12 MED ORDER — ALUM & MAG HYDROXIDE-SIMETH 200-200-20 MG/5ML PO SUSP
30.0000 mL | Freq: Once | ORAL | Status: AC
  Administered 2018-06-12: 30 mL via ORAL
  Filled 2018-06-12: qty 30

## 2018-06-12 MED ORDER — ONDANSETRON HCL 4 MG/2ML IJ SOLN
INTRAMUSCULAR | Status: AC
  Filled 2018-06-12: qty 2

## 2018-06-12 MED ORDER — SOTALOL HCL 80 MG PO TABS
80.0000 mg | ORAL_TABLET | Freq: Two times a day (BID) | ORAL | Status: DC
  Administered 2018-06-12 – 2018-06-16 (×7): 80 mg via ORAL
  Filled 2018-06-12 (×9): qty 1

## 2018-06-12 MED ORDER — ONDANSETRON HCL 4 MG/2ML IJ SOLN
4.0000 mg | Freq: Four times a day (QID) | INTRAMUSCULAR | Status: DC | PRN
  Administered 2018-06-12: 4 mg via INTRAVENOUS
  Filled 2018-06-12: qty 2

## 2018-06-12 MED ORDER — LEVOFLOXACIN IN D5W 750 MG/150ML IV SOLN: 750.0000 mg | INTRAVENOUS | Status: DC

## 2018-06-12 MED ORDER — METRONIDAZOLE IN NACL 5-0.79 MG/ML-% IV SOLN
500.0000 mg | Freq: Three times a day (TID) | INTRAVENOUS | Status: DC
  Administered 2018-06-12 – 2018-06-16 (×11): 500 mg via INTRAVENOUS
  Filled 2018-06-12 (×14): qty 100

## 2018-06-12 MED ORDER — HYDROMORPHONE HCL 1 MG/ML IJ SOLN
1.0000 mg | Freq: Once | INTRAMUSCULAR | Status: AC
  Administered 2018-06-12: 1 mg via INTRAVENOUS
  Filled 2018-06-12: qty 1

## 2018-06-12 MED ORDER — MORPHINE SULFATE (PF) 2 MG/ML IV SOLN
2.0000 mg | INTRAVENOUS | Status: DC | PRN
  Administered 2018-06-13 – 2018-06-14 (×3): 2 mg via INTRAVENOUS
  Filled 2018-06-12 (×3): qty 1

## 2018-06-12 MED ORDER — MORPHINE SULFATE (PF) 4 MG/ML IV SOLN
4.0000 mg | Freq: Once | INTRAVENOUS | Status: AC
  Administered 2018-06-12: 4 mg via INTRAVENOUS
  Filled 2018-06-12: qty 1

## 2018-06-12 MED ORDER — PANTOPRAZOLE SODIUM 40 MG IV SOLR
40.0000 mg | INTRAVENOUS | Status: DC
  Administered 2018-06-12 – 2018-06-15 (×4): 40 mg via INTRAVENOUS
  Filled 2018-06-12 (×4): qty 40

## 2018-06-12 MED ORDER — METRONIDAZOLE IN NACL 5-0.79 MG/ML-% IV SOLN
500.0000 mg | Freq: Three times a day (TID) | INTRAVENOUS | Status: DC
  Filled 2018-06-12 (×2): qty 100

## 2018-06-12 MED ORDER — FAMOTIDINE 20 MG PO TABS
20.0000 mg | ORAL_TABLET | Freq: Once | ORAL | Status: AC
  Administered 2018-06-12: 20 mg via ORAL
  Filled 2018-06-12: qty 1

## 2018-06-12 MED ORDER — ONDANSETRON HCL 4 MG PO TABS: 4.0000 mg | ORAL_TABLET | Freq: Four times a day (QID) | ORAL | Status: DC | PRN

## 2018-06-12 MED ORDER — SODIUM CHLORIDE 0.9 % IV BOLUS
1000.0000 mL | Freq: Once | INTRAVENOUS | Status: AC
  Administered 2018-06-12: 1000 mL via INTRAVENOUS

## 2018-06-12 MED ORDER — LEVOFLOXACIN IN D5W 750 MG/150ML IV SOLN
750.0000 mg | Freq: Once | INTRAVENOUS | Status: AC
  Administered 2018-06-12: 750 mg via INTRAVENOUS
  Filled 2018-06-12: qty 150

## 2018-06-12 MED ORDER — APIXABAN 5 MG PO TABS
5.0000 mg | ORAL_TABLET | Freq: Two times a day (BID) | ORAL | Status: DC
  Administered 2018-06-12 – 2018-06-16 (×8): 5 mg via ORAL
  Filled 2018-06-12 (×8): qty 1

## 2018-06-12 NOTE — Consult Note (Signed)
Pharmacy Antibiotic Note  Chelsey Williams is a 85 y.o. female admitted on 06/12/2018 with Intra-abdominal infection .  Pharmacy has been consulted for Levofloxacin dosing. Patient has a reported anaphylaxis reaction to cephalosporins. Patient received levofloxacin 722m IV x 1 dose in ED.   Plan: Patient is taking sotalol for A. Fib. QTc on admission 439.  There is increased risk for QTc prolongation with sotalol and levofloxacin. MD is aware of risk and plans to order EKG tomorrow.   Will order levofloxacin 7559mIV every 48 hours to start 2/21 based on current CrCl < 5045min.   Height: 5' 3"  (160 cm) Weight: 160 lb (72.6 kg) IBW/kg (Calculated) : 52.4  Temp (24hrs), Avg:98.2 F (36.8 C), Min:98.2 F (36.8 C), Max:98.2 F (36.8 C)  Recent Labs  Lab 06/12/18 1033  WBC 13.7*  CREATININE 0.56    Estimated Creatinine Clearance: 46.4 mL/min (by C-G formula based on SCr of 0.56 mg/dL).    Allergies  Allergen Reactions  . Cephalexin Anaphylaxis    Shock / Unconsciousness  . Diltiazem Hcl Swelling  . Hydrochlorothiazide Other (See Comments)    dizziness dizziness  . Prednisone Shortness Of Breath    SOB  . Felodipine Nausea Only    Gastrointestinal problems, e.g., nausea, vomiting, diarrheaShock / Unconsciousnessmuscle tenderness  . Other Other (See Comments)    A blood pressure med A blood pressure med  . Statins Other (See Comments)  . Amlodipine Other (See Comments) and Palpitations  . Atenolol Palpitations    heart palpitations    Antimicrobials this admission: 2/19 levofloxacin >>  2/19 Flagyl  >>   Thank you for allowing pharmacy to be a part of this patient's care.  ShePernell DupreharmD, BCPS Clinical Pharmacist 06/12/2018 4:41 PM

## 2018-06-12 NOTE — ED Provider Notes (Signed)
[facility] Emergency Department Provider Note  ____________________________________________  Time seen: Approximately 2:29 PM  I have reviewed the triage vital signs and the nursing notes.   HISTORY  Chief Complaint Abdominal Pain    HPI Chelsey Williams is a 85 y.o. female with a history of GERD, hypertension, atrial fibrillation, pacemaker insertion who complains of right lower quadrant abdominal pain for the past 2 days.  Unable to eat or drink anything.  No constipation.  It is associated with nausea and vomiting, severe, sharp, radiates to the back.  No aggravating or alleviating factors.  She went to the [facility] emergency room last night, had labs and CT scan performed but left prior to completing her evaluation.  She comes to the ED today by EMS due to persistence and worsening of her symptoms.  She denies chest pain shortness of breath fevers chills sweats dizziness or syncope.      Past Medical History:  Diagnosis Date  . Anxiety   . Atypical chest pain 11/07/2016  . Bilateral hearing loss   . Coronary artery disease, non-occlusive    a. LHC 11/2014: R dom, LM nl, mLAD 30-40, dLAD minor irregs, LCx minor irregs, RCA minor irregs w/ associated ectasia  . Depression   . GERD (gastroesophageal reflux disease)   . Glaucoma   . Hyperlipemia   . Hypertension   . Macular degeneration   . OA (osteoarthritis)   . Pacemaker    a. Medtronic Adapta ADDRL1 dual-chamber pacemaker (serial number [id number]) with Medtronic 8250 atrial lead (serial number [id number]) and Medtronic 5076 ventricular lead (serial number [id number]), all implanted 12/08/2014 by Dr. Jacklynn Barnacle at [facility] [phone].    Marland Kitchen Palpitations 08/16/2016  . Persistent atrial fibrillation    a. s/p DCCV 02/2016; b. sotalol and Eliquis; c. CHADS2VASc --> 4 (HTN, age x 2, female)  . Sinus node dysfunction [facility])      Patient Active Problem List   Diagnosis Date Noted  . COPD (chronic  obstructive pulmonary disease) ([place]) 10/17/2017  . Irritable bowel syndrome with diarrhea 10/17/2017  . Scalp lesion 10/17/2017  . Vision loss of left eye 08/21/2017  . Labile hypertension 11/07/2016  . Anxiety 11/07/2016  . Coronary artery disease, non-occlusive 08/16/2016  . Episodic atrial fibrillation ([place]) 08/16/2016  . Pacemaker 08/16/2016  . Essential hypertension 08/16/2016  . Dilatation of thoracic aorta ([place]) 08/16/2016  . HLD (hyperlipidemia) 08/16/2016  . Macular degeneration 08/16/2016  . Anticoagulant long-term use 03/24/2016  . AV block 03/24/2016  . Bilateral hearing loss 11/06/2014  . History of thyroid nodule 11/06/2014  . Nonexudative senile macular degeneration of retina 03/31/2013  . Pseudophakia 03/31/2013  . PVD (posterior vitreous detachment), both eyes 03/31/2013  . Generalized osteoarthritis of multiple sites 02/05/2004     Past Surgical History:  Procedure Laterality Date  . CHOLECYSTECTOMY    . knee replacmen    . PACEMAKER IMPLANT  2014  . thyroid nodule       Prior to Admission medications   Medication Sig Start Date End Date Taking? Authorizing Provider  albuterol (PROVENTIL HFA;VENTOLIN HFA) 108 (90 Base) MCG/ACT inhaler Inhale 2 puffs into the lungs every 6 (six) hours as needed for wheezing or shortness of breath. 10/17/17 06/13/19  Glean Hess, MD  apixaban (ELIQUIS) 5 MG TABS tablet Take 1 tablet (5 mg total) by mouth 2 (two) times daily. 07/25/17   Deboraha Sprang, MD  Calcium Carbonate-Vitamin D (CALCIUM-VITAMIN D3 PO) Take 1 each by mouth daily.  [provider]  cholecalciferol (VITAMIN D) 1000 units tablet Take 1,000 Units by mouth daily.    [provider]  cholestyramine (QUESTRAN) 4 g packet Take by mouth 2 (two) times daily.  11/13/16   [provider]  folic acid (FOLVITE) 1 MG tablet Take 1 mg by mouth daily.    [provider]  hydrALAZINE (APRESOLINE) 10 MG tablet Take 20 mg by mouth 3  (three) times daily. 02/22/17   [provider]  HYDROcodone-acetaminophen (NORCO/VICODIN) 5-325 MG tablet take 1 tablet by mouth every 6 hours if needed for pain 10/31/16   [provider]  ibuprofen (ADVIL,MOTRIN) 200 MG tablet Take 200 mg by mouth every 6 (six) hours as needed for fever or mild pain.     [provider]  Lecithin 1200 MG CAPS Take by mouth.    [provider]  lisinopril (PRINIVIL,ZESTRIL) 20 MG tablet Take 1 tablet (20 mg total) by mouth 2 (two) times daily. 11/07/16   Henreitta Leber, MD  loperamide (IMODIUM) 2 MG capsule Take 2 mg by mouth as needed for diarrhea or loose stools.    [provider]  olopatadine (PATANOL) 0.1 % ophthalmic solution Place 1 drop into the right eye 2 (two) times daily.  11/07/16   [provider]  Omega-3 Fatty Acids (FISH OIL) 1200 MG CAPS Take by mouth.    [provider]  omeprazole (PRILOSEC) 20 MG capsule Take 20 mg by mouth daily as needed.     [provider]  psyllium (METAMUCIL) 58.6 % packet Take 1 packet by mouth daily as needed.    [provider]  sotalol (BETAPACE) 80 MG tablet TAKE 1 TABLET EVERY 12 HOURS 10/29/17   Deboraha Sprang, MD  vitamin B-12 (CYANOCOBALAMIN) 1000 MCG tablet Take 1,000 mcg by mouth daily.    [provider]     Allergies Cephalexin; Diltiazem hcl; Hydrochlorothiazide; Prednisone; Felodipine; Other; Statins; Amlodipine; and Atenolol   Family History  Problem Relation Age of Onset  . Hypertension Mother   . Stroke Mother   . Hypertension Father     Social History Social History   Tobacco Use  . Smoking status: Former Smoker    Last attempt to quit: 11/15/1999    Years since quitting: 18.5  . Smokeless tobacco: Never Used  Substance Use Topics  . Alcohol use: No  . Drug use: No    Review of Systems  Constitutional:   No fever or chills.  ENT:   No sore throat. No rhinorrhea. Cardiovascular:   No chest  pain or syncope. Respiratory:   No dyspnea or cough. Gastrointestinal: Positive as above for abdominal pain and vomiting  musculoskeletal:   Negative for focal pain or swelling All other systems reviewed and are negative except as documented above in ROS and HPI.  ____________________________________________   PHYSICAL EXAM:  VITAL SIGNS: ED Triage Vitals  Enc Vitals Group     BP 06/12/18 1011 137/84     Pulse Rate 06/12/18 1011 76     Resp 06/12/18 1011 12     Temp 06/12/18 1011 98.2 F (36.8 C)     Temp Source 06/12/18 1011 Oral     SpO2 06/12/18 1011 100 %     Weight 06/12/18 1008 160 lb (72.6 kg)     Height 06/12/18 1008 5' 3"  (1.6 m)     Head Circumference --      Peak Flow --      Pain  Score 06/12/18 1244 9     Pain Loc --      Pain Edu? --      Excl. in [place]? --     Vital signs reviewed, nursing assessments reviewed.   Constitutional:   Alert and oriented. Non-toxic appearance. Eyes:   Conjunctivae are normal. EOMI. PERRL. ENT      Head:   Normocephalic and atraumatic.      Nose:   No congestion/rhinnorhea.       Mouth/Throat:   Dry mucous membranes, no pharyngeal erythema. No peritonsillar mass.       Neck:   No meningismus. Full ROM. Hematological/Lymphatic/Immunilogical:   No cervical lymphadenopathy. Cardiovascular:   RRR. Symmetric bilateral radial and DP pulses.  No murmurs. Cap refill less than 2 seconds. Respiratory:   Normal respiratory effort without tachypnea/retractions. Breath sounds are clear and equal bilaterally. No wheezes/rales/rhonchi. Gastrointestinal:   Soft with diffuse tenderness, worse in the right lower quadrant.. Non distended. There is no CVA tenderness.  No rebound, rigidity, or guarding. Musculoskeletal:   Normal range of motion in all extremities. No joint effusions.  No lower extremity tenderness.  No edema. Neurologic:   Normal speech and language.  Motor grossly intact. No acute focal neurologic deficits are appreciated.  Skin:     Skin is warm, dry and intact. No rash noted.  No petechiae, purpura, or bullae.  ____________________________________________    LABS (pertinent positives/negatives) (all labs ordered are listed, but only abnormal results are displayed) Labs Reviewed  COMPREHENSIVE METABOLIC PANEL - Abnormal; Notable for the following components:      Result Value   Potassium 3.3 (*)    Chloride 114 (*)    CO2 20 (*)    Calcium 7.3 (*)    Total Protein 5.3 (*)    Albumin 2.8 (*)    All other components within normal limits  CBC WITH DIFFERENTIAL/PLATELET - Abnormal; Notable for the following components:   WBC 13.7 (*)    Neutro Abs 11.1 (*)    Monocytes Absolute 1.2 (*)    All other components within normal limits  URINALYSIS, COMPLETE (UACMP) WITH MICROSCOPIC - Abnormal; Notable for the following components:   Color, Urine YELLOW (*)    APPearance CLEAR (*)    Specific Gravity, Urine >1.046 (*)    Hgb urine dipstick SMALL (*)    Ketones, ur 20 (*)    All other components within normal limits   ____________________________________________   EKG  Interpreted by me Atrial paced rhythm, rate of 74.  Normal axis and intervals.  Normal QRS ST segments and T waves.  ____________________________________________    RADIOLOGY  No results found.  ____________________________________________   PROCEDURES Procedures  ____________________________________________  DIFFERENTIAL DIAGNOSIS   Appendicitis, enteritis, bowel obstruction, ileus  CLINICAL IMPRESSION / ASSESSMENT AND PLAN / ED COURSE  Medications ordered in the ED: Medications  sodium chloride 0.9 % bolus 1,000 mL (1,000 mLs Intravenous Bolus 06/12/18 1042)  ondansetron (ZOFRAN) injection 4 mg (4 mg Intravenous Given 06/12/18 1037)  morphine 4 MG/ML injection 4 mg (4 mg Intravenous Given 06/12/18 1037)  alum & mag hydroxide-simeth (MAALOX/MYLANTA) 200-200-20 MG/5ML suspension 30 mL (30 mLs Oral Given 06/12/18 1253)  ondansetron  (ZOFRAN) injection 4 mg (4 mg Intravenous Given 06/12/18 1313)  HYDROmorphone (DILAUDID) injection 1 mg (1 mg Intravenous Given 06/12/18 1426)  metoCLOPramide (REGLAN) injection 5 mg (5 mg Intravenous Given 06/12/18 1425)    Pertinent labs & imaging results that were available during my care of the  patient were reviewed by me and considered in my medical decision making (see chart for details).    Patient presents with generalized abdominal pain worse in the right lower quadrant.  CT scan was obtained at [facility] emergency room last night, reviewed in care everywhere which shows multiple dilated loops consistent with enteritis, no bowel obstruction.  Normal appendix.  No perforation or abscess.  Labs overall unremarkable but show a leukocytosis of 13,000.  No clear infectious source, antibiotics not indicated.  We will continue to follow-up urinalysis when available.  Unfortunately during the course of the patient's ED visit despite multiple doses of opioids and antiemetics and IV fluids for hydration she continues to have severe pain and nausea and vomiting.  Unable to tolerate oral intake.  Vital signs remained stable.  Patient will be hospitalized for further management of her intractable nausea vomiting and abdominal pain with her enteritis.      ____________________________________________   FINAL CLINICAL IMPRESSION(S) / ED DIAGNOSES    Final diagnoses:  Generalized abdominal pain  Enteritis  Intractable nausea and vomiting     ED Discharge Orders    None      Portions of this note were generated with dragon dictation software. Dictation errors may occur despite best attempts at proofreading.   Carrie Mew, MD 06/12/18 1444

## 2018-06-12 NOTE — Progress Notes (Signed)
.  Family Meeting Note  Advance Directive:yes  Today a meeting took place with the Patient.   The following clinical team members were present during this meeting:MD  The following were discussed:Patient's diagnosis: Acute abdominal pain mainly in the right lower quadrant with acute enteritis with a dilated bowel loops, chronic atrial fibrillation on Eliquis, hypokalemia, hypertension, coronary artery disease, hyperlipidemia, anxiety and the treatment plan of care discussed in detail with the patient and her son at bedside.  They  verbalized understanding of the plan   patient's progosis: Unable to determine and Goals for treatment: Full Code  Son Aayushi Solorzano is the healthcare POA  Additional follow-up to be provided: Hospitalist, gastroenterology, general surgery  Time spent during discussion:17 min  Nicholes Mango, MD

## 2018-06-12 NOTE — ED Triage Notes (Signed)
Pt comes in with right lower abdominal pain that radiates to back and chest. Pt has had n/v appearing to be bile per EMS. Pt able to stand and pivot to bed. Pt reports not able to urinate recently. Pt was seen at [facility] last night but was not seen by a doctor for 6 hours so she LWBT. Pt positive for tenderness with palpation.

## 2018-06-12 NOTE — ED Notes (Signed)
ED in surge red, floor will send someone to pick up pt

## 2018-06-12 NOTE — Consult Note (Signed)
Vonda Antigua, MD [address], [place], [place], [place], [address] [address], [place], [place], [place], [address] Phone: [phone]  Fax: [phone]  Consultation  Referring Provider:     Dr. Margaretmary Eddy Primary Care Physician:  Patient, No Pcp Per Reason for Consultation:     Abdominal pain  Date of Admission:  06/12/2018 Date of Consultation:  06/12/2018         HPI:   Chelsey Williams is a 85 y.o. female who presents with her son due to abdominal pain.  Patient reports chronic history of abdominal pain dating back to years, with acute exacerbation over the last 3 weeks.  Reports abdominal pain to be in the bilateral lower quadrant region, dull, 5/10, nonradiating, associated with nausea and vomiting.  Patient reports chronically she has loose bowel movements daily and takes Imodium for them.  Last use was 2 days ago about 1 to 2 pills.  Has not had a bowel movement today.  No recent blood in stool.  No hematemesis.  States her emesis consisted of foul-smelling yellow material "that looked like poop".  Patient denies any sick contacts or recent travel.  She went to [facility] yesterday and left after partial work-up as they did not like the wait time there.  Her lab work there was reassuring with normal hemoglobin, white count and platelets.  CMP was entirely reassuring as well with no abnormalities.  However, her CT report under the ED visit note reported marked wall thickening of a few loops of small bowel in the right lower quadrant with upstream dilation of several bowel loops which may be reactive.  It reported that the terminal ileum is not involved.   Past Medical History:  Diagnosis Date  . Anxiety   . Atypical chest pain 11/07/2016  . Bilateral hearing loss   . Coronary artery disease, non-occlusive    a. LHC 11/2014: R dom, LM nl, mLAD 30-40, dLAD minor irregs, LCx minor irregs, RCA minor irregs w/ associated ectasia  . Depression   . GERD (gastroesophageal reflux disease)    . Glaucoma   . Hyperlipemia   . Hypertension   . Macular degeneration   . OA (osteoarthritis)   . Pacemaker    a. Medtronic Adapta ADDRL1 dual-chamber pacemaker (serial number [id number]) with Medtronic 2229 atrial lead (serial number [id number]) and Medtronic 5076 ventricular lead (serial number [id number]), all implanted 12/08/2014 by Dr. Jacklynn Barnacle at [facility] [phone].    Marland Kitchen Palpitations 08/16/2016  . Persistent atrial fibrillation    a. s/p DCCV 02/2016; b. sotalol and Eliquis; c. CHADS2VASc --> 4 (HTN, age x 2, female)  . Sinus node dysfunction [facility])     Past Surgical History:  Procedure Laterality Date  . CHOLECYSTECTOMY    . knee replacmen    . PACEMAKER IMPLANT  2014  . thyroid nodule      Prior to Admission medications   Medication Sig Start Date End Date Taking? Authorizing Provider  Calcium Carbonate-Vitamin D (CALCIUM-VITAMIN D3 PO) Take 1 each by mouth daily.   Yes [provider]  cholecalciferol (VITAMIN D) 1000 units tablet Take 1,000 Units by mouth daily.   Yes [provider]  cholestyramine (QUESTRAN) 4 g packet Take by mouth 2 (two) times daily.  11/13/16  Yes [provider]  folic acid (FOLVITE) 1 MG tablet Take 1 mg by mouth daily.   Yes [provider]  ipratropium (ATROVENT HFA) 17 MCG/ACT inhaler Inhale 2 puffs into the lungs 4 (four)  times daily. 02/11/18 02/11/19 Yes [provider]  Lecithin 1200 MG CAPS Take by mouth.   Yes [provider]  lisinopril (PRINIVIL,ZESTRIL) 40 MG tablet Take 40 mg by mouth daily. 06/01/18  Yes [provider]  Omega-3 Fatty Acids (FISH OIL) 1200 MG CAPS Take by mouth.   Yes [provider]  vitamin B-12 (CYANOCOBALAMIN) 1000 MCG tablet Take 1,000 mcg by mouth daily.   Yes [provider]  albuterol (PROVENTIL HFA;VENTOLIN HFA) 108 (90 Base) MCG/ACT inhaler Inhale 2 puffs into the lungs every 6 (six) hours as needed for wheezing or shortness  of breath. 10/17/17 06/13/19  Glean Hess, MD  apixaban (ELIQUIS) 5 MG TABS tablet Take 1 tablet (5 mg total) by mouth 2 (two) times daily. 07/25/17   Deboraha Sprang, MD  hydrALAZINE (APRESOLINE) 10 MG tablet Take 20 mg by mouth 3 (three) times daily. 02/22/17   [provider]  HYDROcodone-acetaminophen (NORCO/VICODIN) 5-325 MG tablet take 1 tablet by mouth every 6 hours if needed for pain 10/31/16   [provider]  ibuprofen (ADVIL,MOTRIN) 200 MG tablet Take 200 mg by mouth every 6 (six) hours as needed for fever or mild pain.     [provider]  loperamide (IMODIUM) 2 MG capsule Take 2 mg by mouth as needed for diarrhea or loose stools.    [provider]  olopatadine (PATANOL) 0.1 % ophthalmic solution Place 1 drop into the right eye 2 (two) times daily.  11/07/16   [provider]  omeprazole (PRILOSEC) 20 MG capsule Take 20 mg by mouth daily as needed.     [provider]  psyllium (METAMUCIL) 58.6 % packet Take 1 packet by mouth daily as needed.    [provider]  sotalol (BETAPACE) 80 MG tablet TAKE 1 TABLET EVERY 12 HOURS 10/29/17   Deboraha Sprang, MD    Family History  Problem Relation Age of Onset  . Hypertension Mother   . Stroke Mother   . Hypertension Father      Social History   Tobacco Use  . Smoking status: Former Smoker    Last attempt to quit: 11/15/1999    Years since quitting: 18.5  . Smokeless tobacco: Never Used  Substance Use Topics  . Alcohol use: No  . Drug use: No    Allergies as of 06/12/2018 - Review Complete 06/12/2018  Allergen Reaction Noted  . Cephalexin Anaphylaxis 04/08/2012  . Diltiazem hcl Swelling 11/06/2014  . Hydrochlorothiazide Other (See Comments) 11/06/2014  . Prednisone Shortness Of Breath 11/06/2014  . Felodipine Nausea Only 11/06/2014  . Other Other (See Comments) 04/08/2012  . Statins Other (See Comments) 11/06/2014  . Amlodipine Other (See Comments) and Palpitations  11/06/2014  . Atenolol Palpitations 11/06/2014    Review of Systems:    All systems reviewed and negative except where noted in HPI.   Physical Exam:  Vital signs in last 24 hours: Vitals:   06/12/18 1500 06/12/18 1600 06/12/18 1615 06/12/18 1630  BP: 131/71 (!) 144/82  (!) 141/63  Pulse: 78  77 76  Resp: 14 17 18 19   Temp:      TempSrc:      SpO2: 95%  95% 92%  Weight:      Height:         General:   Pleasant, cooperative in NAD Head:  Normocephalic and atraumatic. Eyes:   No icterus.   Conjunctiva pink. PERRLA. Ears:  Normal auditory acuity. Neck:  Supple; no  masses or thyroidomegaly Lungs: Respirations even and unlabored. Lungs clear to auscultation bilaterally.   No wheezes, crackles, or rhonchi.  Abdomen:  Soft, nondistended, mildly tender to palpation diffusely. Normal bowel sounds. No appreciable masses or hepatomegaly.  No rebound or guarding.  Neurologic:  Alert and oriented x3;  grossly normal neurologically. Skin:  Intact without significant lesions or rashes. Cervical Nodes:  No significant cervical adenopathy. Psych:  Alert and cooperative. Normal affect.  LAB RESULTS: Recent Labs    06/12/18 1033  WBC 13.7*  HGB 13.5  HCT 42.8  PLT 354   BMET Recent Labs    06/12/18 1033  NA 140  K 3.3*  CL 114*  CO2 20*  GLUCOSE 86  BUN 15  CREATININE 0.56  CALCIUM 7.3*   LFT Recent Labs    06/12/18 1033  PROT 5.3*  ALBUMIN 2.8*  AST 22  ALT 11  ALKPHOS 45  BILITOT 0.5   PT/INR No results for input(s): LABPROT, INR in the last 72 hours.  STUDIES: Findings:  Minimal dependent bibasilar atelectasis. The heart measures upper limits of normal. Partially visualized cardiac pacer.  The liver is normal in size and contour. Focal fat deposition along the falciform ligament. No focal hepatic lesions. The portal and hepatic veins are patent. The gallbladder surgically absent. There is moderate intrahepatic biliary ductal dilatation, which is increased  from the prior examination. The common bile duct is dilated measuring up to 1.2 cm, previously 0.7 cm. There is abrupt tapering in the region of the ampulla.  The spleen and right adrenal gland are unremarkable. Stable 1.1 cm left adrenal nodule, which is indeterminate. The pancreatic duct is prominent measuring up to 3 mm in the pancreatic head, slightly increased from the prior examination. No peripancreatic inflammation. No mass is identified.   Symmetric parenchymal enhancement of the bilateral kidneys. Multiple subcentimeter low-attenuation renal lesions, which are too small to accurately characterize but appears similar to 08/14/2017. A subcentimeter lesion in the superior pole of the left kidney likely does not represent a simple cyst but is too small to characterize. Exophytic right renal cyst. No nephrolithiasis or hydronephrosis. The urinary bladder is unremarkable for degree of distention. The uterus and adnexa are unremarkable for age.  Small hiatal hernia. The bowel is normal in caliber. The appendix is normal. There is marked wall thickening and hyperenhancement of several loops of ileum in the right lower quadrant with mucosal hyperenhancement and mild upstream dilation with small bowel feces, which may be reactive. These findings are favored to represent a nonspecific enteritis. No free fluid or free intraperitoneal air. No pneumatosis or portal venous gas.  The abdominal aorta is normal in caliber with patent branch vessels. Moderate aortoiliac calcifications. There are a few prominent mesenteric lymph nodes in the right lower quadrant, likely reactive. No retroperitoneal, pelvic, or inguinal lymphadenopathy.  Multilevel degenerative disc disease. No aggressive osseous lesions.  Impression: 1. Marked wall thickening of a few loops of small bowel in the right lower quadrant with upstream dilation of several bowel loops, which may be reactive. The terminal ileum is not  involved. These findings are favored to represent a nonspecific enteritis, including infectious (such as yersinia), inflammatory, or ischemic etiologies. No pneumatosis, portal venous gas, or free intraperitoneal air. 2. Increased intrahepatic and extrahepatic biliary ductal dilatation without evidence of an obstructing mass. Recommend correlation with LFTs.   Impression / Plan:   Chelsey Williams is a 85 y.o. y/o female with chronic abdominal pain with acute exacerbation over  the last 3 weeks associated with nausea and vomiting  Patient CT findings of marked wall thickening of small bowel loops with upstream dilation is the cause of her symptoms.  The CT reports that the findings are favored to represent nonspecific enteritis.  Would recommend surgery consult given finding of upstream dilation to evaluate for possible strictures.  In addition, patient reported that her emesis looked and smelled like stool.  Therefore, this is concerning for obstruction at the area of upstream dilation noted on CT.  After surgery evaluation, if further imaging is needed, could consider MR enterography or CT enterography to evaluate the small bowel.   Patient's hemoglobin is normal and she does not have any evidence of bleeding, and ischemic causes for above CT findings is less likely.  Infectious enteritis causing above findings is a possibility, but patient is not having any diarrhea  Would recommend infectious work-up with next bowel movement with C. difficile and GI panel.  Would avoid NSAIDs as that can cause ulcers of the stomach as well.  Primary team has started gram-negative antibiotics.  Continue to monitor for improvement on these.  Patient is on Imodium at home.  Would hold on this admission possibly consider discontinuing it in the future.  No indication for endoscopy at this time as it will not change management, and instead can worsen bowel obstruction as air insufflation is used to visualize  the lumen and would not lead to any diagnostic or therapeutic benefit in this setting.  Patient also reports intermittent reflux and would like a medication for this.  I see patient has been started on Protonix once daily, which is appropriate.  Her biliary duct dilation noted on CT scan is nonspecific.  This is likely postcholecystectomy.  Her pain is not in the upper quadrants, not in the right upper quadrant, and is biliary duct dilation is not symptomatic.  In addition her liver enzymes are completely normal, therefore biliary duct dilation based on clinical signs and symptoms is likely normal postcholecystectomy in this case.  If liver enzymes become abnormal, then can consider further evaluation with MRCP, but currently this is not the cause of her abdominal pain which is lower abdomen.  Thank you for involving me in the care of this patient.      LOS: 0 days   Virgel Manifold, MD  06/12/2018, 4:51 PM

## 2018-06-12 NOTE — H&P (Signed)
[facility] [place] NAME: Chelsey Williams    MR#:  [id number]  DATE OF BIRTH:  Sep 24, 1927  DATE OF ADMISSION:  06/12/2018  PRIMARY CARE PHYSICIAN: Patient, No Pcp Per   REQUESTING/REFERRING PHYSICIAN: Joni Fears  CHIEF COMPLAINT:   Abdominal pain HISTORY OF PRESENT ILLNESS:  Chelsey Williams  is a 85 y.o. female with a known history of coronary artery disease, chronic anxiety, hypertension, hyperlipidemia, chronic atrial fibrillation on Eliquis has been having generalized abdominal pain mainly on the right lower quadrant associated with nausea and vomiting for the past 2 to 3 days.  Patient is unable to keep anything down.  I have been to [facility] emergency department yesterday and had CT scan of the abdomen done which has revealed intrahepatic and extrahepatic bile duct dilatation and dilated bowel loops consistent with enteritis but no bowel obstruction.  As there was a long waiting list they could not wait in the [facility] ED and I went home and came into [facility] as patient is not feeling better.  Hospitalist team is called to admit the patient.. Patient is still nauseous and reporting right lower quadrant abdominal pain.  Son at bedside.  He denies any fever or sick contacts.  PAST MEDICAL HISTORY:   Past Medical History:  Diagnosis Date  . Anxiety   . Atypical chest pain 11/07/2016  . Bilateral hearing loss   . Coronary artery disease, non-occlusive    a. LHC 11/2014: R dom, LM nl, mLAD 30-40, dLAD minor irregs, LCx minor irregs, RCA minor irregs w/ associated ectasia  . Depression   . GERD (gastroesophageal reflux disease)   . Glaucoma   . Hyperlipemia   . Hypertension   . Macular degeneration   . OA (osteoarthritis)   . Pacemaker    a. Medtronic Adapta ADDRL1 dual-chamber pacemaker (serial number [id number]) with Medtronic 6712 atrial lead (serial number [id number]) and Medtronic 5076 ventricular lead (serial number [id number]), all implanted  12/08/2014 by Dr. Jacklynn Barnacle at [facility] [phone].    Marland Kitchen Palpitations 08/16/2016  . Persistent atrial fibrillation    a. s/p DCCV 02/2016; b. sotalol and Eliquis; c. CHADS2VASc --> 4 (HTN, age x 2, female)  . Sinus node dysfunction (HCC)     PAST SURGICAL HISTOIRY:   Past Surgical History:  Procedure Laterality Date  . CHOLECYSTECTOMY    . knee replacmen    . PACEMAKER IMPLANT  2014  . thyroid nodule      SOCIAL HISTORY:   Social History   Tobacco Use  . Smoking status: Former Smoker    Last attempt to quit: 11/15/1999    Years since quitting: 18.5  . Smokeless tobacco: Never Used  Substance Use Topics  . Alcohol use: No    FAMILY HISTORY:   Family History  Problem Relation Age of Onset  . Hypertension Mother   . Stroke Mother   . Hypertension Father     DRUG ALLERGIES:   Allergies  Allergen Reactions  . Cephalexin Anaphylaxis    Shock / Unconsciousness  . Diltiazem Hcl Swelling  . Hydrochlorothiazide Other (See Comments)    dizziness dizziness  . Prednisone Shortness Of Breath    SOB  . Felodipine Nausea Only    Gastrointestinal problems, e.g., nausea, vomiting, diarrheaShock / Unconsciousnessmuscle tenderness  . Other Other (See Comments)    A blood pressure med A blood pressure med  . Statins Other (See Comments)  . Amlodipine Other (See Comments) and Palpitations  . Atenolol  Palpitations    heart palpitations    REVIEW OF SYSTEMS:  CONSTITUTIONAL: No fever, fatigue or weakness.  EYES: No blurred or double vision.  EARS, NOSE, AND THROAT: No tinnitus or ear pain.  RESPIRATORY: No cough, shortness of breath, wheezing or hemoptysis.  CARDIOVASCULAR: No chest pain, orthopnea, edema.  GASTROINTESTINAL: Endorsing nausea, vomiting, generalized abdominal pain , mainly in the right lower quadrant, no diarrhea  GENITOURINARY: No dysuria, hematuria.  ENDOCRINE: No polyuria, nocturia,  HEMATOLOGY: No anemia, easy bruising or bleeding SKIN: No rash or  lesion. MUSCULOSKELETAL: No joint pain or arthritis.   NEUROLOGIC: No tingling, numbness, weakness.  PSYCHIATRY: No anxiety or depression.   MEDICATIONS AT HOME:   Prior to Admission medications   Medication Sig Start Date End Date Taking? Authorizing Provider  Calcium Carbonate-Vitamin D (CALCIUM-VITAMIN D3 PO) Take 1 each by mouth daily.   Yes [provider]  cholecalciferol (VITAMIN D) 1000 units tablet Take 1,000 Units by mouth daily.   Yes [provider]  cholestyramine (QUESTRAN) 4 g packet Take by mouth 2 (two) times daily.  11/13/16  Yes [provider]  folic acid (FOLVITE) 1 MG tablet Take 1 mg by mouth daily.   Yes [provider]  ipratropium (ATROVENT HFA) 17 MCG/ACT inhaler Inhale 2 puffs into the lungs 4 (four) times daily. 02/11/18 02/11/19 Yes [provider]  Lecithin 1200 MG CAPS Take by mouth.   Yes [provider]  lisinopril (PRINIVIL,ZESTRIL) 40 MG tablet Take 40 mg by mouth daily. 06/01/18  Yes [provider]  Omega-3 Fatty Acids (FISH OIL) 1200 MG CAPS Take by mouth.   Yes [provider]  vitamin B-12 (CYANOCOBALAMIN) 1000 MCG tablet Take 1,000 mcg by mouth daily.   Yes [provider]  albuterol (PROVENTIL HFA;VENTOLIN HFA) 108 (90 Base) MCG/ACT inhaler Inhale 2 puffs into the lungs every 6 (six) hours as needed for wheezing or shortness of breath. 10/17/17 06/13/19  Glean Hess, MD  apixaban (ELIQUIS) 5 MG TABS tablet Take 1 tablet (5 mg total) by mouth 2 (two) times daily. 07/25/17   Deboraha Sprang, MD  hydrALAZINE (APRESOLINE) 10 MG tablet Take 20 mg by mouth 3 (three) times daily. 02/22/17   [provider]  HYDROcodone-acetaminophen (NORCO/VICODIN) 5-325 MG tablet take 1 tablet by mouth every 6 hours if needed for pain 10/31/16   [provider]  ibuprofen (ADVIL,MOTRIN) 200 MG tablet Take 200 mg by mouth every 6 (six) hours as needed for fever or mild pain.      [provider]  loperamide (IMODIUM) 2 MG capsule Take 2 mg by mouth as needed for diarrhea or loose stools.    [provider]  olopatadine (PATANOL) 0.1 % ophthalmic solution Place 1 drop into the right eye 2 (two) times daily.  11/07/16   [provider]  omeprazole (PRILOSEC) 20 MG capsule Take 20 mg by mouth daily as needed.     [provider]  psyllium (METAMUCIL) 58.6 % packet Take 1 packet by mouth daily as needed.    [provider]  sotalol (BETAPACE) 80 MG tablet TAKE 1 TABLET EVERY 12 HOURS 10/29/17   Deboraha Sprang, MD      VITAL SIGNS:  Blood pressure (!) 141/63, pulse 76, temperature 98.2 F (36.8 C), temperature source Oral, resp. rate 19, height 5' 3"  (1.6 m), weight 72.6 kg, SpO2 92 %.  PHYSICAL EXAMINATION:  GENERAL:  85 y.o.-year-old patient lying in the bed with no  acute distress.  EYES: Pupils equal, round, reactive to light and accommodation. No scleral icterus. Extraocular muscles intact.  HEENT: Head atraumatic, normocephalic. Oropharynx and nasopharynx clear.  NECK:  Supple, no jugular venous distention. No thyroid enlargement, no tenderness.  LUNGS: Normal breath sounds bilaterally, no wheezing, rales,rhonchi or crepitation. No use of accessory muscles of respiration.  CARDIOVASCULAR: S1, S2 normal. No murmurs, rubs, or gallops.  ABDOMEN: Soft, right lower quadrant is tender with no rebound tenderness, distended. Bowel sounds present.  EXTREMITIES: No pedal edema, cyanosis, or clubbing.  NEUROLOGIC: Cranial nerves II through XII are intact. Muscle strength 5/5 in all extremities. Sensation intact. Gait not checked.  PSYCHIATRIC: The patient is alert and oriented x 3.  SKIN: No obvious rash, lesion, or ulcer.   LABORATORY PANEL:   CBC Recent Labs  Lab 06/12/18 1033  WBC 13.7*  HGB 13.5  HCT 42.8  PLT 354    ------------------------------------------------------------------------------------------------------------------  Chemistries  Recent Labs  Lab 06/12/18 1033  NA 140  K 3.3*  CL 114*  CO2 20*  GLUCOSE 86  BUN 15  CREATININE 0.56  CALCIUM 7.3*  AST 22  ALT 11  ALKPHOS 45  BILITOT 0.5   ------------------------------------------------------------------------------------------------------------------  Cardiac Enzymes No results for input(s): TROPONINI in the last 168 hours. ------------------------------------------------------------------------------------------------------------------  RADIOLOGY:  No results found.  EKG:   Orders placed or performed during the hospital encounter of 06/12/18  . EKG 12-Lead  . EKG 12-Lead  . EKG 12-Lead    IMPRESSION AND PLAN:  #Acute abdominal pain consistent with enteritis on CT scan Admit to MedSurg unit IV levofloxacin and Flagyl, IV fluids N.p.o. except for ice chips and meds GI consult placed and Dr. Bonna Gains is aware of the consult she will see the patient Surgery consult placed for dilated bowel loops as recommended by gastroenterology.  Dr. Celine Ahr is aware of the consult Pain management as needed  #Hypokalemia replete and recheck in a.m.  #Chronic atrial fibrillation rate controlled continue home medication sotalol and Eliquis Repeat EKG in a.m. as QT interval is prolonged  #Anxiety-currently stable and not on any home medications at this time  #Essential hypertension-continue home medication hydralazine, lisinopril    DVT prophylaxis on Eliquis All the records are reviewed and case discussed with ED provider. Management plans discussed with the patient, family and they are in agreement.  CODE STATUS: FC   TOTAL TIME TAKING CARE OF THIS PATIENT: 45 minutes.   Note: This dictation was prepared with Dragon dictation along with smaller phrase technology. Any transcriptional errors that result from this process  are unintentional.  Nicholes Mango M.D on 06/12/2018 at 4:55 PM  Between 7am to 6pm - Pager - [phone]  After 6pm go to [email or web address] - password [email or web address] [facility]  Office  [phone]  CC: Primary care physician; Patient, No Pcp Per

## 2018-06-12 NOTE — ED Notes (Signed)
Pt has vomitted x3 since being here in ER.

## 2018-06-12 NOTE — Consult Note (Signed)
Reason for Consult:Abdominal pain Referring Physician: Dr. Margaretmary Eddy, Internal medicine  Chelsey Williams is an 85 y.o. female.  HPI: She presented to the emergency department at [facility] today with a history of 3 weeks of abdominal pain.  She was apparently seen at [facility] emergency department yesterday, but left prior to completion of her evaluation secondary to a long wait time.  She gives a history of having had a "good infection" on several prior occasions.  She says that this seems similar to those episodes.  She endorses chills without fever.  She has had both nausea and emesis.  She states that her emesis is nonbloody but smells like stool.  She states that she has not had a bowel movement for the last 2 days.  She says that she has had chronic right-sided abdominal pain for the past 17 years, but that it has worsened and is more diffuse at this time.  She had a CT scan performed at [facility].  Unfortunately, I am unable to visualize the images.  The report is copied here:  Procedure Note - Interface, Rad Results In - 06/12/2018 9:23 AM EST  CT abdomen and pelvis with IV contrast  Comparison: 08/14/2017.  Indication: Abdominal pain, acute, nonlocalized, generalized abdominal pain, R10.84 Generalized abdominal pain.  Technique: CT imaging was performed of the abdomen and pelvis following the administration of intravenous contrast. Iodinated contrast was used due to the indications for the examination, to improve disease detection and further define anatomy. Coronal and sagittal reformatted images were generated and reviewed.  Findings:  Minimal dependent bibasilar atelectasis. The heart measures upper limits of normal. Partially visualized cardiac pacer.  The liver is normal in size and contour. Focal fat deposition along the falciform ligament. No focal hepatic lesions. The portal and hepatic veins are patent. The gallbladder surgically absent. There is  moderate intrahepatic biliary ductal dilatation, which is increased from the prior examination. The common bile duct is dilated measuring up to 1.2 cm, previously 0.7 cm. There is abrupt tapering in the region of the ampulla.  The spleen and right adrenal gland are unremarkable. Stable 1.1 cm left adrenal nodule, which is indeterminate. The pancreatic duct is prominent measuring up to 3 mm in the pancreatic head, slightly increased from the prior examination. No peripancreatic inflammation. No mass is identified.   Symmetric parenchymal enhancement of the bilateral kidneys. Multiple subcentimeter low-attenuation renal lesions, which are too small to accurately characterize but appears similar to 08/14/2017. A subcentimeter lesion in the superior pole of the left kidney likely does not represent a simple cyst but is too small to characterize. Exophytic right renal cyst. No nephrolithiasis or hydronephrosis. The urinary bladder is unremarkable for degree of distention. The uterus and adnexa are unremarkable for age.  Small hiatal hernia. The bowel is normal in caliber. The appendix is normal. There is marked wall thickening and hyperenhancement of several loops of ileum in the right lower quadrant with mucosal hyperenhancement and mild upstream dilation with small bowel feces, which may be reactive. These findings are favored to represent a nonspecific enteritis. No free fluid or free intraperitoneal air. No pneumatosis or portal venous gas.  The abdominal aorta is normal in caliber with patent branch vessels. Moderate aortoiliac calcifications. There are a few prominent mesenteric lymph nodes in the right lower quadrant, likely reactive. No retroperitoneal, pelvic, or inguinal lymphadenopathy.  Multilevel degenerative disc disease. No aggressive osseous lesions.  Impression: 1. Marked wall thickening of a few loops of small bowel  in the right lower quadrant with upstream dilation of  several bowel loops, which may be reactive. The terminal ileum is not involved. These findings are favored to represent a nonspecific enteritis, including infectious (such as yersinia), inflammatory, or ischemic etiologies. No pneumatosis, portal venous gas, or free intraperitoneal air. 2. Increased intrahepatic and extrahepatic biliary ductal dilatation without evidence of an obstructing mass. Recommend correlation with LFTs.   Gastroenterology was consulted due to the biliary dilatation, they recommended surgical consultation due to the findings of wall thickening in the bowel loops as well as some upstream dilation.  Past Medical History:  Diagnosis Date  . Anxiety   . Atypical chest pain 11/07/2016  . Bilateral hearing loss   . Coronary artery disease, non-occlusive    a. LHC 11/2014: R dom, LM nl, mLAD 30-40, dLAD minor irregs, LCx minor irregs, RCA minor irregs w/ associated ectasia  . Depression   . GERD (gastroesophageal reflux disease)   . Glaucoma   . Hyperlipemia   . Hypertension   . Macular degeneration   . OA (osteoarthritis)   . Pacemaker    a. Medtronic Adapta ADDRL1 dual-chamber pacemaker (serial number [id number]) with Medtronic [id number] atrial lead (serial number [id number]) and Medtronic 5076 ventricular lead (serial number [id number]), all implanted 12/08/2014 by Dr. Jacklynn Barnacle at [facility] [phone].    Marland Kitchen Palpitations 08/16/2016  . Persistent atrial fibrillation    a. s/p DCCV 02/2016; b. sotalol and Eliquis; c. CHADS2VASc --> 4 (HTN, age x 2, female)  . Sinus node dysfunction [facility])     Past Surgical History:  Procedure Laterality Date  . CHOLECYSTECTOMY    . knee replacmen    . PACEMAKER IMPLANT  2014  . thyroid nodule      Family History  Problem Relation Age of Onset  . Hypertension Mother   . Stroke Mother   . Hypertension Father     Social History:  reports that she quit smoking about 18 years ago. She has never used smokeless tobacco. She reports  that she does not drink alcohol or use drugs.  Allergies:  Allergies  Allergen Reactions  . Cephalexin Anaphylaxis    Shock / Unconsciousness  . Diltiazem Hcl Swelling  . Hydrochlorothiazide Other (See Comments)    dizziness dizziness  . Prednisone Shortness Of Breath    SOB  . Felodipine Nausea Only    Gastrointestinal problems, e.g., nausea, vomiting, diarrheaShock / Unconsciousnessmuscle tenderness  . Other Other (See Comments)    A blood pressure med A blood pressure med  . Statins Other (See Comments)  . Amlodipine Other (See Comments) and Palpitations  . Atenolol Palpitations    heart palpitations    Medications: I have reviewed the patient's current medications.  Results for orders placed or performed during the hospital encounter of 06/12/18 (from the past 48 hour(s))  Comprehensive metabolic panel     Status: Abnormal   Collection Time: 06/12/18 10:33 AM  Result Value Ref Range   Sodium 140 135 - 145 mmol/L   Potassium 3.3 (L) 3.5 - 5.1 mmol/L   Chloride 114 (H) 98 - 111 mmol/L   CO2 20 (L) 22 - 32 mmol/L   Glucose, Bld 86 70 - 99 mg/dL   BUN 15 8 - 23 mg/dL   Creatinine, Ser 0.56 0.44 - 1.00 mg/dL   Calcium 7.3 (L) 8.9 - 10.3 mg/dL   Total Protein 5.3 (L) 6.5 - 8.1 g/dL   Albumin 2.8 (L) 3.5 - 5.0 g/dL   AST 22  15 - 41 U/L   ALT 11 0 - 44 U/L   Alkaline Phosphatase 45 38 - 126 U/L   Total Bilirubin 0.5 0.3 - 1.2 mg/dL   GFR calc non Af Amer >60 >60 mL/min   GFR calc Af Amer >60 >60 mL/min   Anion gap 6 5 - 15    Comment: Performed at [facility], [address] [place], [place] [address]  CBC with Differential     Status: Abnormal   Collection Time: 06/12/18 10:33 AM  Result Value Ref Range   WBC 13.7 (H) 4.0 - 10.5 K/uL   RBC 4.82 3.87 - 5.11 MIL/uL   Hemoglobin 13.5 12.0 - 15.0 g/dL   HCT 42.8 36.0 - 46.0 %   MCV 88.8 80.0 - 100.0 fL   MCH 28.0 26.0 - 34.0 pg   MCHC 31.5 30.0 - 36.0 g/dL   RDW 14.2 11.5 - 15.5 %   Platelets 354 150 -  400 K/uL   nRBC 0.0 0.0 - 0.2 %   Neutrophils Relative % 81 %   Neutro Abs 11.1 (H) 1.7 - 7.7 K/uL   Lymphocytes Relative 9 %   Lymphs Abs 1.2 0.7 - 4.0 K/uL   Monocytes Relative 8 %   Monocytes Absolute 1.2 (H) 0.1 - 1.0 K/uL   Eosinophils Relative 1 %   Eosinophils Absolute 0.1 0.0 - 0.5 K/uL   Basophils Relative 0 %   Basophils Absolute 0.1 0.0 - 0.1 K/uL   Immature Granulocytes 1 %   Abs Immature Granulocytes 0.07 0.00 - 0.07 K/uL    Comment: Performed at [facility], [address] [place], [place] [address]  Urinalysis, Complete w Microscopic     Status: Abnormal   Collection Time: 06/12/18 12:33 PM  Result Value Ref Range   Color, Urine YELLOW (A) YELLOW   APPearance CLEAR (A) CLEAR   Specific Gravity, Urine >1.046 (H) 1.005 - 1.030   pH 5.0 5.0 - 8.0   Glucose, UA NEGATIVE NEGATIVE mg/dL   Hgb urine dipstick SMALL (A) NEGATIVE   Bilirubin Urine NEGATIVE NEGATIVE   Ketones, ur 20 (A) NEGATIVE mg/dL   Protein, ur NEGATIVE NEGATIVE mg/dL   Nitrite NEGATIVE NEGATIVE   Leukocytes,Ua NEGATIVE NEGATIVE   RBC / HPF 11-20 0 - 5 RBC/hpf   WBC, UA 0-5 0 - 5 WBC/hpf   Bacteria, UA NONE SEEN NONE SEEN   Squamous Epithelial / LPF 0-5 0 - 5   Mucus PRESENT     Comment: Performed at [facility], [address] [place], [place] [address]    No results found.  Review of Systems  Constitutional: Positive for chills and malaise/fatigue.  Gastrointestinal: Positive for abdominal pain, nausea and vomiting. Negative for blood in stool, constipation, diarrhea and melena.  All other systems reviewed and are negative.  Blood pressure (!) 141/63, pulse 76, temperature 98.2 F (36.8 C), temperature source Oral, resp. rate 19, height 5' 3"  (1.6 m), weight 72.6 kg, SpO2 92 %. Physical Exam  Constitutional: She is oriented to person, place, and time. She appears well-developed and well-nourished. No distress.  HENT:  Head: Normocephalic and atraumatic.   Mouth/Throat: No oropharyngeal exudate.  Poor dentition Bilateral hearing aids  Eyes: Right eye exhibits no discharge. Left eye exhibits no discharge. No scleral icterus.  Wears glasses.  Neck: Normal range of motion. No tracheal deviation present.  Cardiovascular: Intact distal pulses.  Irreg. irreg.  Respiratory: Effort normal. No stridor.  GI: Soft. She  exhibits distension. She exhibits no mass. There is abdominal tenderness. There is no rebound and no guarding.  Genitourinary:    Genitourinary Comments: deferred   Musculoskeletal:        General: No tenderness, deformity or edema.  Lymphadenopathy:    She has no cervical adenopathy.  Neurological: She is alert and oriented to person, place, and time.  Skin: Skin is warm and dry.  Psychiatric: She has a normal mood and affect.   Outside hospital CT scan reviewed as above, again, I am unable to actually view the images, only the report.  Results for SHEBA, WHALING (MRN [id number]) as of 06/12/2018 18:34  Ref. Range 06/12/2018 10:33 06/12/2018 12:33  Sodium Latest Ref Range: 135 - 145 mmol/L 140   Potassium Latest Ref Range: 3.5 - 5.1 mmol/L 3.3 (L)   Chloride Latest Ref Range: 98 - 111 mmol/L 114 (H)   CO2 Latest Ref Range: 22 - 32 mmol/L 20 (L)   Glucose Latest Ref Range: 70 - 99 mg/dL 86   BUN Latest Ref Range: 8 - 23 mg/dL 15   Creatinine Latest Ref Range: 0.44 - 1.00 mg/dL 0.56   Calcium Latest Ref Range: 8.9 - 10.3 mg/dL 7.3 (L)   Anion gap Latest Ref Range: 5 - 15  6   Alkaline Phosphatase Latest Ref Range: 38 - 126 U/L 45   Albumin Latest Ref Range: 3.5 - 5.0 g/dL 2.8 (L)   AST Latest Ref Range: 15 - 41 U/L 22   ALT Latest Ref Range: 0 - 44 U/L 11   Total Protein Latest Ref Range: 6.5 - 8.1 g/dL 5.3 (L)   Total Bilirubin Latest Ref Range: 0.3 - 1.2 mg/dL 0.5   GFR, Est Non African American Latest Ref Range: >60 mL/min >60   GFR, Est African American Latest Ref Range: >60 mL/min >60   WBC Latest Ref Range: 4.0 - 10.5  K/uL 13.7 (H)   RBC Latest Ref Range: 3.87 - 5.11 MIL/uL 4.82   Hemoglobin Latest Ref Range: 12.0 - 15.0 g/dL 13.5   HCT Latest Ref Range: 36.0 - 46.0 % 42.8   MCV Latest Ref Range: 80.0 - 100.0 fL 88.8   MCH Latest Ref Range: 26.0 - 34.0 pg 28.0   MCHC Latest Ref Range: 30.0 - 36.0 g/dL 31.5   RDW Latest Ref Range: 11.5 - 15.5 % 14.2   Platelets Latest Ref Range: 150 - 400 K/uL 354   nRBC Latest Ref Range: 0.0 - 0.2 % 0.0   Neutrophils Latest Units: % 81   Lymphocytes Latest Units: % 9   Monocytes Relative Latest Units: % 8   Eosinophil Latest Units: % 1   Basophil Latest Units: % 0   Immature Granulocytes Latest Units: % 1   NEUT# Latest Ref Range: 1.7 - 7.7 K/uL 11.1 (H)   Lymphocyte # Latest Ref Range: 0.7 - 4.0 K/uL 1.2   Monocyte # Latest Ref Range: 0.1 - 1.0 K/uL 1.2 (H)   Eosinophils Absolute Latest Ref Range: 0.0 - 0.5 K/uL 0.1   Basophils Absolute Latest Ref Range: 0.0 - 0.1 K/uL 0.1   Abs Immature Granulocytes Latest Ref Range: 0.00 - 0.07 K/uL 0.07   Appearance Latest Ref Range: CLEAR   CLEAR (A)  Bilirubin Urine Latest Ref Range: NEGATIVE   NEGATIVE  Color, Urine Latest Ref Range: YELLOW   YELLOW (A)  Glucose, UA Latest Ref Range: NEGATIVE mg/dL  NEGATIVE  Hgb urine dipstick Latest Ref Range: NEGATIVE   SMALL (  A)  Ketones, ur Latest Ref Range: NEGATIVE mg/dL  20 (A)  Nitrite Latest Ref Range: NEGATIVE   NEGATIVE  pH Latest Ref Range: 5.0 - 8.0   5.0  Protein Latest Ref Range: NEGATIVE mg/dL  NEGATIVE  Specific Gravity, Urine Latest Ref Range: 1.005 - 1.030   >1.046 (H)  Bacteria, UA Latest Ref Range: NONE SEEN   NONE SEEN  Mucus Unknown  PRESENT  RBC / HPF Latest Ref Range: 0 - 5 RBC/hpf  11-20  Squamous Epithelial / LPF Latest Ref Range: 0 - 5   0-5  WBC, UA Latest Ref Range: 0 - 5 WBC/hpf  0-5    Assessment/Plan: Vee Bahe is an 85 year old woman who presents with a three-week history of abdominal pain.  This is been accompanied by nausea and vomiting.  Her  gallbladder is surgically absent which likely explains the biliary dilatation.  Appendix, per the CT scan report, is normal.  Her only history of abdominal surgery is cholecystectomy and bilateral tubal ligation.  These were performed laparoscopically and are therefore less likely to cause adhesive disease.  She gives a history of multiple episodes of "gut infection".  She says that this current event seems very similar to those.  At this time, it seems most likely that she has enteritis potentially secondary to infectious disease.  I do not see that she has any surgical indications.  Given her history of atrial fibrillation, however, it may be worth checking a lactic acid level to ensure that these findings are not secondary to ischemic bowel.  Frankly, however, this seems less likely as her pain is not out of proportion to exam and she has no rebound or guarding.  She is not described as having a distinct transition point on the outside hospital CT scan.  One might consider CT enterography to better evaluate the small bowel.  If she continues to have nausea and vomiting, she may benefit from nasoenteric tube placement to decompress her gut.  General surgery will continue to follow.  Fredirick Maudlin 06/12/2018, 6:03 PM

## 2018-06-12 NOTE — ED Notes (Signed)
MRI called to let this nurse know pt has pacemaker and cannot have MRI.

## 2018-06-12 NOTE — ED Notes (Signed)
ED TO INPATIENT HANDOFF REPORT  Name/Age/Gender Chelsey Williams 85 y.o. female  Code Status    Code Status Orders  (From admission, onward)         Start     Ordered   06/12/18 1825  Full code  Continuous     06/12/18 1824        Code Status History    Date Active Date Inactive Code Status Order ID Comments User Context   11/07/2016 0228 11/07/2016 2318 Full Code [id number]  Lance Coon, MD Inpatient   08/15/2016 1817 08/16/2016 2105 Full Code [id number]  Dustin Flock, MD Inpatient    Advance Directive Documentation     Most Recent Value  Type of Advance Directive  Healthcare Power of Attorney, Living will  Pre-existing out of facility DNR order (yellow form or pink MOST form)  -  "MOST" Form in Place?  -      Home/SNF/Other home  Chief Complaint abdominal pain   Level of Care/Admitting Diagnosis ED Disposition    ED Disposition Condition [place]: [place] [100120]  Level of Care: Med-Surg [16]  Diagnosis: Acute abdominal pain [594585]  Admitting Physician: Nicholes Mango [[id number]]  Attending Physician: Nicholes Mango [[id number]]  Estimated length of stay: 3 - 4 days  Certification:: I certify this patient will need inpatient services for at least 2 midnights  PT Class (Do Not Modify): Inpatient [101]  PT Acc Code (Do Not Modify): Private [1]       Medical History Past Medical History:  Diagnosis Date  . Anxiety   . Atypical chest pain 11/07/2016  . Bilateral hearing loss   . Coronary artery disease, non-occlusive    a. LHC 11/2014: R dom, LM nl, mLAD 30-40, dLAD minor irregs, LCx minor irregs, RCA minor irregs w/ associated ectasia  . Depression   . GERD (gastroesophageal reflux disease)   . Glaucoma   . Hyperlipemia   . Hypertension   . Macular degeneration   . OA (osteoarthritis)   . Pacemaker    a. Medtronic Adapta ADDRL1 dual-chamber pacemaker (serial number [id number]) with Medtronic 9292 atrial lead (serial  number [id number]) and Medtronic 5076 ventricular lead (serial number [id number]), all implanted 12/08/2014 by Dr. Jacklynn Barnacle at [facility] [phone].    Marland Kitchen Palpitations 08/16/2016  . Persistent atrial fibrillation    a. s/p DCCV 02/2016; b. sotalol and Eliquis; c. CHADS2VASc --> 4 (HTN, age x 2, female)  . Sinus node dysfunction (HCC)     Allergies Allergies  Allergen Reactions  . Cephalexin Anaphylaxis    Shock / Unconsciousness  . Diltiazem Hcl Swelling  . Hydrochlorothiazide Other (See Comments)    dizziness dizziness  . Prednisone Shortness Of Breath    SOB  . Felodipine Nausea Only    Gastrointestinal problems, e.g., nausea, vomiting, diarrheaShock / Unconsciousnessmuscle tenderness  . Other Other (See Comments)    A blood pressure med A blood pressure med  . Statins Other (See Comments)  . Amlodipine Other (See Comments) and Palpitations  . Atenolol Palpitations    heart palpitations    IV Location/Drains/Wounds Patient Lines/Drains/Airways Status   Active Line/Drains/Airways    Name:   Placement date:   Placement time:   Site:   Days:   Peripheral IV 06/12/18 Right Hand   06/12/18    1037    Hand   less than 1          Labs/Imaging Results for orders placed or performed during  the hospital encounter of 06/12/18 (from the past 48 hour(s))  Comprehensive metabolic panel     Status: Abnormal   Collection Time: 06/12/18 10:33 AM  Result Value Ref Range   Sodium 140 135 - 145 mmol/L   Potassium 3.3 (L) 3.5 - 5.1 mmol/L   Chloride 114 (H) 98 - 111 mmol/L   CO2 20 (L) 22 - 32 mmol/L   Glucose, Bld 86 70 - 99 mg/dL   BUN 15 8 - 23 mg/dL   Creatinine, Ser 0.56 0.44 - 1.00 mg/dL   Calcium 7.3 (L) 8.9 - 10.3 mg/dL   Total Protein 5.3 (L) 6.5 - 8.1 g/dL   Albumin 2.8 (L) 3.5 - 5.0 g/dL   AST 22 15 - 41 U/L   ALT 11 0 - 44 U/L   Alkaline Phosphatase 45 38 - 126 U/L   Total Bilirubin 0.5 0.3 - 1.2 mg/dL   GFR calc non Af Amer >60 >60 mL/min   GFR calc Af Amer >60  >60 mL/min   Anion gap 6 5 - 15    Comment: Performed at [facility], [address] [place], [place] [address]  CBC with Differential     Status: Abnormal   Collection Time: 06/12/18 10:33 AM  Result Value Ref Range   WBC 13.7 (H) 4.0 - 10.5 K/uL   RBC 4.82 3.87 - 5.11 MIL/uL   Hemoglobin 13.5 12.0 - 15.0 g/dL   HCT 42.8 36.0 - 46.0 %   MCV 88.8 80.0 - 100.0 fL   MCH 28.0 26.0 - 34.0 pg   MCHC 31.5 30.0 - 36.0 g/dL   RDW 14.2 11.5 - 15.5 %   Platelets 354 150 - 400 K/uL   nRBC 0.0 0.0 - 0.2 %   Neutrophils Relative % 81 %   Neutro Abs 11.1 (H) 1.7 - 7.7 K/uL   Lymphocytes Relative 9 %   Lymphs Abs 1.2 0.7 - 4.0 K/uL   Monocytes Relative 8 %   Monocytes Absolute 1.2 (H) 0.1 - 1.0 K/uL   Eosinophils Relative 1 %   Eosinophils Absolute 0.1 0.0 - 0.5 K/uL   Basophils Relative 0 %   Basophils Absolute 0.1 0.0 - 0.1 K/uL   Immature Granulocytes 1 %   Abs Immature Granulocytes 0.07 0.00 - 0.07 K/uL    Comment: Performed at [facility], [address] [place], [place] [address]  Urinalysis, Complete w Microscopic     Status: Abnormal   Collection Time: 06/12/18 12:33 PM  Result Value Ref Range   Color, Urine YELLOW (A) YELLOW   APPearance CLEAR (A) CLEAR   Specific Gravity, Urine >1.046 (H) 1.005 - 1.030   pH 5.0 5.0 - 8.0   Glucose, UA NEGATIVE NEGATIVE mg/dL   Hgb urine dipstick SMALL (A) NEGATIVE   Bilirubin Urine NEGATIVE NEGATIVE   Ketones, ur 20 (A) NEGATIVE mg/dL   Protein, ur NEGATIVE NEGATIVE mg/dL   Nitrite NEGATIVE NEGATIVE   Leukocytes,Ua NEGATIVE NEGATIVE   RBC / HPF 11-20 0 - 5 RBC/hpf   WBC, UA 0-5 0 - 5 WBC/hpf   Bacteria, UA NONE SEEN NONE SEEN   Squamous Epithelial / LPF 0-5 0 - 5   Mucus PRESENT     Comment: Performed at [facility], [address] [place], [place] [address]   No results found.  Pending Labs Unresulted Labs (From admission, onward)    Start     Ordered   06/13/18 0500  CBC  Tomorrow morning,  STAT      06/12/18 1824   06/13/18 0500  Comprehensive metabolic panel  Tomorrow morning,   STAT     06/12/18 1824          Vitals/Pain Today's Vitals   06/12/18 1930 06/12/18 2000 06/12/18 2100 06/12/18 2130  BP: 114/74 (!) 132/114 (!) 134/98 125/66  Pulse: 73 81  75  Resp: 18 14 (!) 27 17  Temp:      TempSrc:      SpO2: 93% 93%  94%  Weight:      Height:      PainSc:        Isolation Precautions No active isolations  Medications Medications  hydrALAZINE (APRESOLINE) tablet 20 mg (has no administration in time range)  sotalol (BETAPACE) tablet 80 mg (has no administration in time range)  lisinopril (PRINIVIL,ZESTRIL) tablet 40 mg (40 mg Oral Given 06/12/18 1856)  apixaban (ELIQUIS) tablet 5 mg (has no administration in time range)  HYDROcodone-acetaminophen (NORCO/VICODIN) 5-325 MG per tablet 1 tablet (has no administration in time range)  morphine 2 MG/ML injection 2 mg (has no administration in time range)  ondansetron (ZOFRAN) tablet 4 mg (has no administration in time range)    Or  ondansetron (ZOFRAN) injection 4 mg (has no administration in time range)  0.9 %  sodium chloride infusion ( Intravenous New Bag/Given 06/12/18 1858)  metroNIDAZOLE (FLAGYL) IVPB 500 mg (500 mg Intravenous Not Given 06/12/18 1837)  pantoprazole (PROTONIX) injection 40 mg (40 mg Intravenous Given 06/12/18 1615)  metroNIDAZOLE (FLAGYL) IVPB 500 mg ( Intravenous Stopped 06/12/18 1830)  levofloxacin (LEVAQUIN) IVPB 750 mg (has no administration in time range)  sodium chloride 0.9 % bolus 1,000 mL (1,000 mLs Intravenous Bolus 06/12/18 1042)  ondansetron (ZOFRAN) injection 4 mg (4 mg Intravenous Given 06/12/18 1037)  morphine 4 MG/ML injection 4 mg (4 mg Intravenous Given 06/12/18 1037)  alum & mag hydroxide-simeth (MAALOX/MYLANTA) 200-200-20 MG/5ML suspension 30 mL (30 mLs Oral Given 06/12/18 1253)  ondansetron (ZOFRAN) injection 4 mg (4 mg Intravenous Given 06/12/18 1313)  HYDROmorphone (DILAUDID) injection  1 mg (1 mg Intravenous Given 06/12/18 1426)  metoCLOPramide (REGLAN) injection 5 mg (5 mg Intravenous Given 06/12/18 1425)  levofloxacin (LEVAQUIN) IVPB 750 mg ( Intravenous Stopped 06/12/18 1730)  calcium carbonate (TUMS - dosed in mg elemental calcium) chewable tablet 200 mg of elemental calcium (0 mg of elemental calcium Oral Return to [facility] 06/12/18 2046)  famotidine (PEPCID) tablet 20 mg (20 mg Oral Given 06/12/18 2047)    Mobility walks

## 2018-06-12 NOTE — ED Notes (Signed)
Ophelia Charter RN, aware of bed assigned

## 2018-06-12 NOTE — ED Notes (Signed)
Pt requested that I verify her levaquin with her heart medication sotolol. This RN spoke with pharmacist who verified medication and low risk of qtc prolongation. Pharmacist also to check in further for follow up doses.

## 2018-06-13 ENCOUNTER — Inpatient Hospital Stay: Payer: Medicare Other

## 2018-06-13 DIAGNOSIS — K567 Ileus, unspecified: Secondary | ICD-10-CM

## 2018-06-13 DIAGNOSIS — R4182 Altered mental status, unspecified: Secondary | ICD-10-CM

## 2018-06-13 DIAGNOSIS — K529 Noninfective gastroenteritis and colitis, unspecified: Principal | ICD-10-CM

## 2018-06-13 LAB — CBC
HCT: 36.9 % (ref 36.0–46.0)
Hemoglobin: 11.6 g/dL — ABNORMAL LOW (ref 12.0–15.0)
MCH: 28.4 pg (ref 26.0–34.0)
MCHC: 31.4 g/dL (ref 30.0–36.0)
MCV: 90.4 fL (ref 80.0–100.0)
Platelets: 312 10*3/uL (ref 150–400)
RBC: 4.08 MIL/uL (ref 3.87–5.11)
RDW: 14.4 % (ref 11.5–15.5)
WBC: 11.3 10*3/uL — AB (ref 4.0–10.5)
nRBC: 0 % (ref 0.0–0.2)

## 2018-06-13 LAB — COMPREHENSIVE METABOLIC PANEL
ALBUMIN: 2.9 g/dL — AB (ref 3.5–5.0)
ALT: 20 U/L (ref 0–44)
AST: 32 U/L (ref 15–41)
Alkaline Phosphatase: 45 U/L (ref 38–126)
Anion gap: 8 (ref 5–15)
BUN: 17 mg/dL (ref 8–23)
CO2: 21 mmol/L — ABNORMAL LOW (ref 22–32)
Calcium: 8.2 mg/dL — ABNORMAL LOW (ref 8.9–10.3)
Chloride: 108 mmol/L (ref 98–111)
Creatinine, Ser: 0.64 mg/dL (ref 0.44–1.00)
GFR calc Af Amer: >60 mL/min (ref >60)
GFR calc non Af Amer: >60 mL/min (ref >60)
Glucose, Bld: 97 mg/dL (ref 70–99)
Potassium: 3.5 mmol/L (ref 3.5–5.1)
Sodium: 137 mmol/L (ref 135–145)
Total Bilirubin: 0.7 mg/dL (ref 0.3–1.2)
Total Protein: 5.6 g/dL — ABNORMAL LOW (ref 6.5–8.1)

## 2018-06-13 LAB — LACTIC ACID, PLASMA: Lactic Acid, Venous: 0.8 mmol/L (ref 0.5–1.9)

## 2018-06-13 LAB — TSH: TSH: 0.849 u[IU]/mL (ref 0.350–4.500)

## 2018-06-13 LAB — AMMONIA: Ammonia: 23 umol/L (ref 9–35)

## 2018-06-13 MED ORDER — CIPROFLOXACIN IN D5W 400 MG/200ML IV SOLN
400.0000 mg | Freq: Two times a day (BID) | INTRAVENOUS | Status: DC
  Administered 2018-06-14 – 2018-06-16 (×5): 400 mg via INTRAVENOUS
  Filled 2018-06-13 (×6): qty 200

## 2018-06-13 MED ORDER — DOCUSATE SODIUM 50 MG/5ML PO LIQD
50.0000 mg | Freq: Every day | ORAL | Status: DC | PRN
  Filled 2018-06-13: qty 10

## 2018-06-13 MED ORDER — FAMOTIDINE 20 MG PO TABS
20.0000 mg | ORAL_TABLET | Freq: Once | ORAL | Status: AC
  Administered 2018-06-13: 20 mg via ORAL
  Filled 2018-06-13: qty 1

## 2018-06-13 NOTE — Progress Notes (Addendum)
[facility] SURGICAL PROGRESS NOTE (cpt [phone])  Hospital Day(s): 1.   Post op day(s):  Marland Kitchen   Interval History: Patient seen and examined, no acute events or new complaints overnight. Patient reports she continues to have fairly diffuse abdominal pain worse in her RLQ. She denied any reports of fever, chills, nausea, or emesis this morning. She is currently NPO but asking for ice chips. Has not mobilized since admission. She feels that she has passed gas "about 3 times" since admission.   Review of Systems:  Constitutional: denies fever, chills  Respiratory: denies any shortness of breath  Cardiovascular: denies chest pain or palpitations  Gastrointestinal: + abdominal pain, denied N/V, or diarrhea/and bowel function as per interval history Genitourinary: denies burning with urination or urinary frequency   Vital signs in last 24 hours: [min-max] current  Temp:  [98.4 F (36.9 C)-98.9 F (37.2 C)] 98.9 F (37.2 C) (02/20 0749) Pulse Rate:  [70-81] 74 (02/20 0749) Resp:  [12-27] 18 (02/20 0749) BP: (114-162)/(62-114) 116/63 (02/20 0749) SpO2:  [88 %-96 %] 96 % (02/20 0749)     Height: 5' 3"  (160 cm) Weight: 72.6 kg BMI (Calculated): 28.35   Intake/Output this shift:  Total I/O In: -  Out: 200 [Urine:200]     Physical Exam:  Constitutional: alert, cooperative and no distress  HENT: normocephalic without obvious abnormality  Eyes: EOM's grossly intact and symmetric  Respiratory: breathing non-labored at rest  Gastrointestinal: soft, somewhat diffuse tenderness but seems worse on her right, and non-distended, no rebound or guarding Musculoskeletal: no edema or wounds, motor and sensation grossly intact, NT    Labs:  CBC Latest Ref Rng & Units 06/13/2018 06/12/2018 05/22/2017  WBC 4.0 - 10.5 K/uL 11.3(H) 13.7(H) 8.3  Hemoglobin 12.0 - 15.0 g/dL 11.6(L) 13.5 12.5  Hematocrit 36.0 - 46.0 % 36.9 42.8 38.1  Platelets 150 - 400 K/uL 312 354 339   CMP Latest Ref Rng  & Units 06/13/2018 06/12/2018 05/22/2017  Glucose 70 - 99 mg/dL 97 86 82  BUN 8 - 23 mg/dL 17 15 20   Creatinine 0.44 - 1.00 mg/dL 0.64 0.56 0.62  Sodium 135 - 145 mmol/L 137 140 142  Potassium 3.5 - 5.1 mmol/L 3.5 3.3(L) 4.6  Chloride 98 - 111 mmol/L 108 114(H) 106  CO2 22 - 32 mmol/L 21(L) 20(L) 21  Calcium 8.9 - 10.3 mg/dL 8.2(L) 7.3(L) 9.1  Total Protein 6.5 - 8.1 g/dL 5.6(L) 5.3(L) -  Total Bilirubin 0.3 - 1.2 mg/dL 0.7 0.5 -  Alkaline Phos 38 - 126 U/L 45 45 -  AST 15 - 41 U/L 32 22 -  ALT 0 - 44 U/L 20 11 -     Imaging studies: No new pertinent imaging studies   Assessment/Plan: (ICD-10's: K52.9) 85 y.o. female with persistent vs chronic abdominal pain, possibly attributable to enteritis, which does not appear improved this morning although improved leukocytosis this morning is more reassuring, complicated by pertinent comorbidities including anxiety, CAD s/p CABG in 2016, HLD, HTN, Atrial fibrillation s/p pacemaker placement, advanced age, obesity, and former tobacco abuse (smoking).   - Will order KUB today to reassess given she is still having significant tenderness. Would have a low threshold to re-image with CT given that we can not view the images from [facility].   - Remain NPO for now, continue IVF  - Continue IV ABx (Levaquin/Flagyl)  - Monitor abdominal examination, ongoing bowel function  - Trend leukocytosis  - Medical management per primary team  - General  surgery will continue to follow  All of the above findings and recommendations were discussed with the patient, and the medical team, and all of patient's questions were answered to her expressed satisfaction.  -- Edison Simon, PA-C [facility] 06/13/2018, 11:45 AM [phone] M-F: 7am - 4pm   I saw and evaluated the patient.  I agree with the above documentation, exam, and plan, which I have edited where appropriate. Fredirick Maudlin  12:07 PM

## 2018-06-13 NOTE — Consult Note (Addendum)
Reason for Consult: Episode of confusion Referring Physician: Max Sane, MD  CC: Confusion  HPI: Chelsey Williams is an 85 y.o. female with pertinent past medical history of atrial fibrillation on Eliquis, CAD, hyperlipidemia, hypertension, and pacemaker placement admitted to the hospital on 06/12/2018 with abdominal pain.  Initial  labs revealed elevated WBC 46.5,[id number] metabolic panel (CMP) unremarkable, lactic acid 0.8, ammonia 23, thyroid-stimulating hormone (TSH) -reflex, urinalysis were unremarkable.  This morning neurology was consulted due to altered mental status concerning for possible stroke due to her history of atrial fibrillation.  A non-contrast head CT was obtained which showed no acute intracranial abnormality.  On exam there were no focal neurological deficits; she was alert and oriented x4, and he did not demonstrate any memory deficits. Denies associated speech abnormality, cranial nerve deficit, focal motor or sensory deficits, vision disturbances, nausea or vomiting, dizziness, headache, paresthesia (numbness, tingling, pins-and-needles sensation) or a heavy feeling in an extremity.  Patient son who is currently at the bedside report that she has had this brief episode of confusion whenever her blood pressure drops in the low 70Y to 90 systolic but she usually "bounces back" quickly. Per patient's son,  she seems to be back to her baseline.  Past Medical History:  Diagnosis Date  . Anxiety   . Atypical chest pain 11/07/2016  . Bilateral hearing loss   . Coronary artery disease, non-occlusive    a. LHC 11/2014: R dom, LM nl, mLAD 30-40, dLAD minor irregs, LCx minor irregs, RCA minor irregs w/ associated ectasia  . Depression   . GERD (gastroesophageal reflux disease)   . Glaucoma   . Hyperlipemia   . Hypertension   . Macular degeneration   . OA (osteoarthritis)   . Pacemaker    a. Medtronic Adapta ADDRL1 dual-chamber pacemaker (serial number [id number]) with Medtronic 1749  atrial lead (serial number [id number]) and Medtronic 5076 ventricular lead (serial number [id number]), all implanted 12/08/2014 by Dr. Jacklynn Barnacle at [facility] [phone].    Marland Kitchen Palpitations 08/16/2016  . Persistent atrial fibrillation    a. s/p DCCV 02/2016; b. sotalol and Eliquis; c. CHADS2VASc --> 4 (HTN, age x 2, female)  . Sinus node dysfunction [facility])     Past Surgical History:  Procedure Laterality Date  . CHOLECYSTECTOMY    . knee replacmen    . PACEMAKER IMPLANT  2014  . thyroid nodule      Family History  Problem Relation Age of Onset  . Hypertension Mother   . Stroke Mother   . Hypertension Father     Social History:  reports that she quit smoking about 18 years ago. She has never used smokeless tobacco. She reports that she does not drink alcohol or use drugs.  Allergies  Allergen Reactions  . Cephalexin Anaphylaxis    Shock / Unconsciousness  . Diltiazem Hcl Swelling  . Hydrochlorothiazide Other (See Comments)    dizziness dizziness  . Prednisone Shortness Of Breath    SOB  . Felodipine Nausea Only    Gastrointestinal problems, e.g., nausea, vomiting, diarrheaShock / Unconsciousnessmuscle tenderness  . Other Other (See Comments)    A blood pressure med A blood pressure med  . Statins Other (See Comments)  . Amlodipine Other (See Comments) and Palpitations  . Atenolol Palpitations    heart palpitations    Medications:  I have reviewed the patient's current medications. Prior to Admission:  Medications Prior to Admission  Medication Sig Dispense Refill Last Dose  . Calcium Carbonate-Vitamin D (CALCIUM-VITAMIN D3 PO)  Take 1 each by mouth daily.   Past Week at Unknown time  . cholecalciferol (VITAMIN D) 1000 units tablet Take 1,000 Units by mouth daily.   Past Week at Unknown time  . cholestyramine (QUESTRAN) 4 g packet Take by mouth 2 (two) times daily.   0 Past Week at Unknown time  . folic acid (FOLVITE) 1 MG tablet Take 1 mg by mouth daily.   Past Week  at Unknown time  . ipratropium (ATROVENT HFA) 17 MCG/ACT inhaler Inhale 2 puffs into the lungs 4 (four) times daily.   prn at prn  . Lecithin 1200 MG CAPS Take by mouth.   Past Week at Unknown time  . lisinopril (PRINIVIL,ZESTRIL) 40 MG tablet Take 40 mg by mouth daily.   06/10/2018 at 0800  . Omega-3 Fatty Acids (FISH OIL) 1200 MG CAPS Take by mouth.   Past Week at Unknown time  . vitamin B-12 (CYANOCOBALAMIN) 1000 MCG tablet Take 1,000 mcg by mouth daily.   Past Week at Unknown time  . albuterol (PROVENTIL HFA;VENTOLIN HFA) 108 (90 Base) MCG/ACT inhaler Inhale 2 puffs into the lungs every 6 (six) hours as needed for wheezing or shortness of breath. 3 each 3 prn at prn  . apixaban (ELIQUIS) 5 MG TABS tablet Take 1 tablet (5 mg total) by mouth 2 (two) times daily. 90 tablet 3 06/10/2018 at 2000  . hydrALAZINE (APRESOLINE) 10 MG tablet Take 20 mg by mouth 3 (three) times daily.   06/10/2018 at 2000  . HYDROcodone-acetaminophen (NORCO/VICODIN) 5-325 MG tablet take 1 tablet by mouth every 6 hours if needed for pain  0 Taking  . ibuprofen (ADVIL,MOTRIN) 200 MG tablet Take 200 mg by mouth every 6 (six) hours as needed for fever or mild pain.    prn at prn  . loperamide (IMODIUM) 2 MG capsule Take 2 mg by mouth as needed for diarrhea or loose stools.   prn at prn  . olopatadine (PATANOL) 0.1 % ophthalmic solution Place 1 drop into the right eye 2 (two) times daily.   0 prn at prn  . omeprazole (PRILOSEC) 20 MG capsule Take 20 mg by mouth daily as needed.    06/10/2018 at 2000  . psyllium (METAMUCIL) 58.6 % packet Take 1 packet by mouth daily as needed.   prn at prn  . sotalol (BETAPACE) 80 MG tablet TAKE 1 TABLET EVERY 12 HOURS 180 tablet 3 06/10/2018 at 2000   Scheduled: . apixaban  5 mg Oral BID  . famotidine  20 mg Oral Once  . hydrALAZINE  20 mg Oral TID  . lisinopril  40 mg Oral Daily  . pantoprazole (PROTONIX) IV  40 mg Intravenous Q24H  . sotalol  80 mg Oral Q12H    ROS: History obtained from  the patient   General ROS: negative for - chills, fatigue, fever, night sweats, weight gain or weight loss Psychological ROS: negative for - behavioral disorder, hallucinations, memory difficulties, mood swings or suicidal ideation Ophthalmic ROS: negative for - blurry vision, double vision, eye pain or loss of vision ENT ROS: negative for - epistaxis, nasal discharge, oral lesions, sore throat, tinnitus or vertigo Allergy and Immunology ROS: negative for - hives or itchy/watery eyes Hematological and Lymphatic ROS: negative for - bleeding problems, bruising or swollen lymph nodes Endocrine ROS: negative for - galactorrhea, hair pattern changes, polydipsia/polyuria or temperature intolerance Respiratory ROS: negative for - cough, hemoptysis, shortness of breath or wheezing Cardiovascular ROS: negative for - chest pain, dyspnea  on exertion, edema or irregular heartbeat Gastrointestinal ROS: positive for - abdominal pain, diarrhea, hematemesis, nausea/vomiting or stool incontinence Genito-Urinary ROS: negative for - dysuria, hematuria, incontinence or urinary frequency/urgency Musculoskeletal ROS: negative for - joint swelling or muscular weakness Neurological ROS: as noted in HPI Dermatological ROS: negative for rash and skin lesion changes  Physical Examination: Blood pressure 116/63, pulse 74, temperature 98.9 F (37.2 C), temperature source Oral, resp. rate 18, height 5' 3"  (1.6 m), weight 72.6 kg, SpO2 96 %.  HEENT-  Normocephalic, no lesions, without obvious abnormality.  Normal external eye and conjunctiva.  Normal TM's bilaterally.  Normal auditory canals and external ears. Normal external nose, mucus membranes and septum.  Normal pharynx. Cardiovascular- S1, S2 normal, pulses palpable throughout   Lungs- chest clear, no wheezing, rales, normal symmetric air entry Abdomen- soft, non-tender; bowel sounds normal; no masses,  no organomegaly Extremities- no edema Lymph-no adenopathy  palpable Musculoskeletal-no joint tenderness, deformity or swelling Skin-warm and dry, no hyperpigmentation, vitiligo, or suspicious lesions  Neurological Exam   Mental Status: Alert, oriented, thought content appropriate.  Speech fluent without evidence of aphasia.  Able to follow 3 step commands without difficulty. Attention span and concentration seemed appropriate  Cranial Nerves: II: Discs flat bilaterally; Visual fields grossly normal, pupils equal, round, reactive to light and accommodation III,IV, VI: ptosis not present, extra-ocular motions intact bilaterally V,VII: smile symmetric, facial light touch sensation intact VIII: hearing normal bilaterally IX,X: gag reflex present XI: bilateral shoulder shrug XII: midline tongue extension Motor: Right :  Upper extremity   5/5 Without pronator drift      Left: Upper extremity   5/5 without pronator drift Right:   Lower extremity   5/5                                          Left: Lower extremity   5/5 Tone and bulk:normal tone throughout; no atrophy noted Sensory: Pinprick and light touch intact bilaterally Deep Tendon Reflexes: 2+ and symmetric throughout Plantars: Right: mute                              Left: mute Cerebellar: Finger-to-nose testing intact bilaterally. Heel to shin testing normal bilaterally Gait: not tested due to safety concerns  Data Reviewed  Laboratory Studies:   Basic Metabolic Panel: Recent Labs  Lab 06/12/18 1033 06/13/18 0547  NA 140 137  K 3.3* 3.5  CL 114* 108  CO2 20* 21*  GLUCOSE 86 97  BUN 15 17  CREATININE 0.56 0.64  CALCIUM 7.3* 8.2*    Liver Function Tests: Recent Labs  Lab 06/12/18 1033 06/13/18 0547  AST 22 32  ALT 11 20  ALKPHOS 45 45  BILITOT 0.5 0.7  PROT 5.3* 5.6*  ALBUMIN 2.8* 2.9*   No results for input(s): LIPASE, AMYLASE in the last 168 hours. Recent Labs  Lab 06/13/18 1043  AMMONIA 23    CBC: Recent Labs  Lab 06/12/18 1033 06/13/18 0547  WBC 13.7*  11.3*  NEUTROABS 11.1*  --   HGB 13.5 11.6*  HCT 42.8 36.9  MCV 88.8 90.4  PLT 354 312    Cardiac Enzymes: No results for input(s): CKTOTAL, CKMB, CKMBINDEX, TROPONINI in the last 168 hours.  BNP: Invalid input(s): POCBNP  CBG: No results for input(s): GLUCAP in the last 168 hours.  Microbiology: No results found for this or any previous visit.  Coagulation Studies: No results for input(s): LABPROT, INR in the last 72 hours.  Urinalysis:  Recent Labs  Lab 06/12/18 1233  COLORURINE YELLOW*  LABSPEC >1.046*  PHURINE 5.0  GLUCOSEU NEGATIVE  HGBUR SMALL*  BILIRUBINUR NEGATIVE  KETONESUR 20*  PROTEINUR NEGATIVE  NITRITE NEGATIVE  LEUKOCYTESUR NEGATIVE    Lipid Panel:  No results found for: CHOL, TRIG, HDL, CHOLHDL, VLDL, LDLCALC  HgbA1C: No results found for: HGBA1C  Urine Drug Screen:  No results found for: LABOPIA, COCAINSCRNUR, LABBENZ, AMPHETMU, THCU, LABBARB  Alcohol Level: No results for input(s): ETH in the last 168 hours.  Other results: EKG: Atrial paced. Vent. rate 74 BPM PR interval * ms QRS duration 78 ms QT/QTc 395/439 ms P-R-T axes * 24 41  Imaging: Ct Head Wo Contrast  Result Date: 06/13/2018 CLINICAL DATA:  Unexplained loss of consciousness. EXAM: CT HEAD WITHOUT CONTRAST TECHNIQUE: Contiguous axial images were obtained from the base of the skull through the vertex without intravenous contrast. COMPARISON:  01/23/2018 FINDINGS: Brain: No evidence of acute infarction, hemorrhage, hydrocephalus, extra-axial collection or mass lesion/mass effect. Moderate brain parenchymal volume loss and deep white matter microangiopathy. Vascular: Calcific atherosclerotic disease of the intra cavernous carotid arteries. Skull: Normal. Negative for fracture or focal lesion. Sinuses/Orbits: No acute finding. Other: None. IMPRESSION: 1. No acute intracranial abnormality. 2. Moderate brain parenchymal atrophy and chronic microvascular disease. Electronically Signed    By: Fidela Salisbury M.D.   On: 06/13/2018 10:13   Patient seen and examined.  Clinical course and management discussed.  Necessary edits performed.  I agree with the above.  Assessment and plan of care developed and discussed below.    Assessment: 85 y.o female  with pertinent past medical history of atrial fibrillation on Eliquis, CAD, hyperlipidemia, hypertension, and pacemaker placement admitted to the hospital on 06/12/2018 with abdominal pain.  She is noted this morning to be confused concerning for possible stroke due to her history of atrial fibrillation.  CT head reviewed and shows no acute intracranial abnormality.  Unable to obtain MRI of the brain due to pacemaker.  There is some concerns for possible hypoperfusion, per patient's son patient occasionally has brief episodes of confusion whenever her blood pressure drops. Have low suspicion for ischemic event at this time. Patient appears to be back to baseline with no focality noted on exam.  Recommendations 1.  Agree with current management of underlying medical condition.   2.  Agree with continued Eliquis  This patient was staffed with Dr. Magda Paganini, Doy Mince who personally evaluated patient, reviewed documentation and agreed with assessment and plan of care as above.  Rufina Falco, DNP, FNP-BC Board certified Nurse Practitioner Neurology Department  06/13/2018, 11:43 AM   No further neurologic intervention is recommended at this time.  If further questions arise, please call or page at that time.  Thank you for allowing neurology to participate in the care of this patient.   Alexis Goodell, MD Neurology [phone] 06/13/2018  1:52 PM

## 2018-06-13 NOTE — Progress Notes (Signed)
Patient refused bed alarm. Patient was educated on the importance of its use. Patient still in disagreement will continue to monitor patient frequently.

## 2018-06-13 NOTE — Progress Notes (Signed)
Vonda Antigua, MD [address], [place], [place], [place], [address] [address], [place], [place], [place], [address] Phone: [phone]  Fax: [phone]   Subjective:  Patient denies any nausea or vomiting.  Reports flatus today.  No bowel movements today.  No fever or chills.  Objective: Exam: Vital signs in last 24 hours: Vitals:   06/12/18 2232 06/13/18 0511 06/13/18 0749 06/13/18 1245  BP: 134/66 115/62 116/63 (!) 120/57  Pulse: 73 72 74 72  Resp: 18 18 18 18   Temp: 98.4 F (36.9 C) 98.7 F (37.1 C) 98.9 F (37.2 C) 99 F (37.2 C)  TempSrc: Oral Oral Oral Oral  SpO2: 96% 95% 96% 93%  Weight:      Height:       Weight change:   Intake/Output Summary (Last 24 hours) at 06/13/2018 1544 Last data filed at 06/13/2018 0900 Gross per 24 hour  Intake 1033.49 ml  Output 200 ml  Net 833.49 ml    General: No acute distress, AAO x3 Abd: Soft, NT/ND, No HSM Skin: Warm, no rashes Neck: Supple, Trachea midline   Lab Results: Lab Results  Component Value Date   WBC 11.3 (H) 06/13/2018   HGB 11.6 (L) 06/13/2018   HCT 36.9 06/13/2018   MCV 90.4 06/13/2018   PLT 312 06/13/2018   Micro Results: No results found for this or any previous visit (from the past 240 hour(s)). Studies/Results: Dg Abd 1 View  Result Date: 06/13/2018 CLINICAL DATA:  Right lower quadrant abdominal pain and distention. EXAM: ABDOMEN - 1 VIEW COMPARISON:  None. FINDINGS: Mildly dilated small bowel loops are noted concerning for distal small bowel obstruction or possibly ileus. No colonic dilatation is noted. Status post cholecystectomy. IMPRESSION: Mildly dilated small bowel loops are noted concerning for distal small bowel obstruction or possibly ileus. Electronically Signed   By: Marijo Conception, M.D.   On: 06/13/2018 13:51   Ct Head Wo Contrast  Result Date: 06/13/2018 CLINICAL DATA:  Unexplained loss of consciousness. EXAM: CT HEAD WITHOUT CONTRAST TECHNIQUE: Contiguous axial  images were obtained from the base of the skull through the vertex without intravenous contrast. COMPARISON:  01/23/2018 FINDINGS: Brain: No evidence of acute infarction, hemorrhage, hydrocephalus, extra-axial collection or mass lesion/mass effect. Moderate brain parenchymal volume loss and deep white matter microangiopathy. Vascular: Calcific atherosclerotic disease of the intra cavernous carotid arteries. Skull: Normal. Negative for fracture or focal lesion. Sinuses/Orbits: No acute finding. Other: None. IMPRESSION: 1. No acute intracranial abnormality. 2. Moderate brain parenchymal atrophy and chronic microvascular disease. Electronically Signed   By: Fidela Salisbury M.D.   On: 06/13/2018 10:13   Medications:  Scheduled Meds: . apixaban  5 mg Oral BID  . hydrALAZINE  20 mg Oral TID  . lisinopril  40 mg Oral Daily  . pantoprazole (PROTONIX) IV  40 mg Intravenous Q24H  . sotalol  80 mg Oral Q12H   Continuous Infusions: . sodium chloride 100 mL/hr at 06/13/18 0725  . [START ON 06/14/2018] ciprofloxacin    . metronidazole 500 mg (06/13/18 0814)   PRN Meds:.HYDROcodone-acetaminophen, morphine injection, ondansetron **OR** ondansetron (ZOFRAN) IV   Assessment: Active Problems:   Acute abdominal pain    Plan: Patient's abdominal x-rays today shows mildly dilated small bowel loops concerning for distal small bowel obstruction or possibly ileus.  No colonic dilation is noted.  She does not have any nausea or vomiting  Again, as stated in my consult note the upstream dilation reported on the CT scan done  at [facility] is concerning for a stricture which is causing this dilation.  Will defer to surgery for further evaluation or surgery of this area as indicated.  No endoscopic intervention indicated at this time and will only serve to worsen small bowel obstruction.  CT enterography or MR enterography can be considered by primary team or surgery as that would allow for a more thorough  evaluation of the abnormality reported on the CT scan at [facility].  Will defer this to primary team and surgery as well  Further care as per surgical and primary team.  GI service will sign off, please page with any questions.   LOS: 1 day   Vonda Antigua, MD 06/13/2018, 3:44 PM

## 2018-06-13 NOTE — Progress Notes (Signed)
[place] [facility] [place] NAME: Chelsey Williams    MR#:  [id number]  DATE OF BIRTH:  [birth year]  SUBJECTIVE:  CHIEF COMPLAINT:   Chief Complaint  Patient presents with  . Abdominal Pain   8/10 diffuse abdominal pain greatest in RLQ, improved after receiving pain meds.  Pain in R knee 10/10, chronic. Improved with pain meds.  States she has not had a BM in 4-5 days. Asks for a stool softener.  + dyspepsia, asks for Pepcid to be reordered. Given ice chips to wet mouth only. + passing flatus   Endorses confusion with systolic BP drops.   REVIEW OF SYSTEMS:  CONSTITUTIONAL: No fever, fatigue or weakness.  EYES: No blurred or double vision.  EARS, NOSE, AND THROAT: No tinnitus or ear pain. Hard of hearing, has hearing aids.  RESPIRATORY: No cough, shortness of breath, wheezing or hemoptysis.  CARDIOVASCULAR: No chest pain, orthopnea, edema.  GASTROINTESTINAL: Endorsing nausea, vomiting, generalized abdominal pain, mainly in the right lower quadrant, no diarrhea. Endorsing constipation x 4-5 days. GENITOURINARY: No dysuria, hematuria.  ENDOCRINE: No polyuria, nocturia,  HEMATOLOGY: No anemia, easy bruising or bleeding SKIN: No rash or lesion. MUSCULOSKELETAL: R knee pain, chronic.  NEUROLOGIC: No tingling, numbness, weakness. Chronic left sided headache. PSYCHIATRY: No anxiety or depression.   DRUG ALLERGIES:   Allergies  Allergen Reactions  . Cephalexin Anaphylaxis    Shock / Unconsciousness  . Diltiazem Hcl Swelling  . Hydrochlorothiazide Other (See Comments)    dizziness dizziness  . Prednisone Shortness Of Breath    SOB  . Felodipine Nausea Only    Gastrointestinal problems, e.g., nausea, vomiting, diarrheaShock / Unconsciousnessmuscle tenderness  . Other Other (See Comments)    A blood pressure med A blood pressure med  . Statins Other (See Comments)  . Amlodipine Other (See Comments) and Palpitations  . Atenolol Palpitations      heart palpitations   VITALS:  Blood pressure 116/63, pulse 74, temperature 98.9 F (37.2 C), temperature source Oral, resp. rate 18, height 5' 3"  (1.6 m), weight 72.6 kg, SpO2 96 %. PHYSICAL EXAMINATION:  GENERAL:  85 y.o.-year-old patient lying in the bed with no acute distress.  EYES: Pupils equal, round, reactive to light and accommodation. No scleral icterus. Extraocular muscles intact.  HEENT: Head atraumatic, normocephalic. Oropharynx and nasopharynx clear.  NECK:  Supple, no jugular venous distention. No thyroid enlargement, no tenderness.  LUNGS: Normal breath sounds bilaterally, no wheezing, rales,rhonchi or crepitation. No use of accessory muscles of respiration.  CARDIOVASCULAR: S1, S2 normal. No murmurs, rubs, or gallops.  ABDOMEN: Soft, right lower quadrant is tender with no rebound tenderness, distended. Also tender diffusely. Bowel sounds present. Passing flatus. EXTREMITIES: No pedal edema, cyanosis, or clubbing.  NEUROLOGIC: Cranial nerves II through XII are intact. Muscle strength 5/5 in all extremities. Sensation intact. Gait not checked.  PSYCHIATRIC: The patient is alert and oriented x 3.  SKIN: No obvious rash, lesion, or ulcer.  LABORATORY PANEL:  Female CBC Recent Labs  Lab 06/13/18 0547  WBC 11.3*  HGB 11.6*  HCT 36.9  PLT 312   ------------------------------------------------------------------------------------------------------------------ Chemistries  Recent Labs  Lab 06/13/18 0547  NA 137  K 3.5  CL 108  CO2 21*  GLUCOSE 97  BUN 17  CREATININE 0.64  CALCIUM 8.2*  AST 32  ALT 20  ALKPHOS 45  BILITOT 0.7   RADIOLOGY:  Ct Head Wo Contrast  Result Date: 06/13/2018 CLINICAL DATA:  Unexplained loss of  consciousness. EXAM: CT HEAD WITHOUT CONTRAST TECHNIQUE: Contiguous axial images were obtained from the base of the skull through the vertex without intravenous contrast. COMPARISON:  01/23/2018 FINDINGS: Brain: No evidence of acute infarction,  hemorrhage, hydrocephalus, extra-axial collection or mass lesion/mass effect. Moderate brain parenchymal volume loss and deep white matter microangiopathy. Vascular: Calcific atherosclerotic disease of the intra cavernous carotid arteries. Skull: Normal. Negative for fracture or focal lesion. Sinuses/Orbits: No acute finding. Other: None. IMPRESSION: 1. No acute intracranial abnormality. 2. Moderate brain parenchymal atrophy and chronic microvascular disease. Electronically Signed   By: Fidela Salisbury M.D.   On: 06/13/2018 10:13   ASSESSMENT AND PLAN:  Chelsey Williams  is a 85 y.o. female with a known history of coronary artery disease, chronic anxiety, hypertension, hyperlipidemia, chronic atrial fibrillation on Eliquis has been having generalized abdominal pain mainly on the right lower quadrant associated with nausea and vomiting for the past 2 to 3 days.  Patient is unable to keep anything down. CT scan of the abdomen done at [facility] which has revealed intrahepatic and extrahepatic bile duct dilatation and dilated bowel loops consistent with enteritis but no bowel obstruction.  As there was a long waiting list they could not wait in the [place] ED and went home and came into [facility]. Surgery and GI following.   #Acute abdominal pain consistent with enteritis on CT scan Believes her emesis smelled like stool. Concern for obstruction.  Was on IV levofloxacin and Flagyl, IV fluids - switched to IV ciprofloxacin and Flagyl per pharmacy recs - EKG ordered for QTc monitoring  - N.p.o. except for ice chips and meds GI consult placed Dr. Bonna Gains: no indication for endoscopy. Avoid NSAIDs. Infectious disease workup with next BM, stool studies ordered, stop Imodium Surgery consult placed for dilated bowel loops as recommended by gastroenterology: Remain NPO, KUB ordered, CT to follow if needed, trend WBC, abx as above  Pain management as needed - lactate within normal limits - bowel regimen, NG decompression  deferred to surgery/GI. Will follow-up  # Intermittent reflux Protonix. Pepcid ordered today at request.  #Confusion Back at baseline. - Neurology consult:  CT normal. MRI not obtainable 2/2 pacemaker. Low concern for ischemic event. Concern for possible hypoperfusion.  #Hypokalemia repleted, normal.  #Chronic atrial fibrillation rate controlled  continue home medication sotalol and Eliquis Repeat EKG in a.m. as QT interval is prolonged  #Anxiety-currently stable and not on any home medications at this time  #Essential hypertension-continue home medication hydralazine, lisinopril Hold hydralazine if soft BPs   All the records are reviewed and case is discussed with Care Management/Social Worker. Management plans discussed with the patient and/or family and they are in agreement.  CODE STATUS: Full Code  TOTAL TIME TAKING CARE OF THIS PATIENT: 30 minutes.   More than 50% of the time was spent in counseling/coordination of care: YES  POSSIBLE D/C IN 1-2 DAYS, DEPENDING ON CLINICAL CONDITION.   Wilho Sharpley PA-C on 06/13/2018 at 11:17 AM  Between 7am to 6pm - Pager - [phone]  After 6 pm go to [email or web address] - Proofreader  [facility]  Office  [phone]  CC: Primary care physician; Patient, No Pcp Per  Note: This dictation was prepared with Dragon dictation along with smaller phrase technology. Any transcriptional errors that result from this process are unintentional.

## 2018-06-14 LAB — CBC
HCT: 35.8 % — ABNORMAL LOW (ref 36.0–46.0)
Hemoglobin: 11.1 g/dL — ABNORMAL LOW (ref 12.0–15.0)
MCH: 28.8 pg (ref 26.0–34.0)
MCHC: 31 g/dL (ref 30.0–36.0)
MCV: 92.7 fL (ref 80.0–100.0)
Platelets: 284 10*3/uL (ref 150–400)
RBC: 3.86 MIL/uL — ABNORMAL LOW (ref 3.87–5.11)
RDW: 14.7 % (ref 11.5–15.5)
WBC: 8.3 10*3/uL (ref 4.0–10.5)
nRBC: 0 % (ref 0.0–0.2)

## 2018-06-14 LAB — COMPREHENSIVE METABOLIC PANEL
ALT: 24 U/L (ref 0–44)
AST: 31 U/L (ref 15–41)
Albumin: 2.9 g/dL — ABNORMAL LOW (ref 3.5–5.0)
Alkaline Phosphatase: 42 U/L (ref 38–126)
Anion gap: 10 (ref 5–15)
BUN: 11 mg/dL (ref 8–23)
CHLORIDE: 111 mmol/L (ref 98–111)
CO2: 19 mmol/L — ABNORMAL LOW (ref 22–32)
Calcium: 8 mg/dL — ABNORMAL LOW (ref 8.9–10.3)
Creatinine, Ser: 0.65 mg/dL (ref 0.44–1.00)
GFR calc non Af Amer: >60 mL/min (ref >60)
Glucose, Bld: 74 mg/dL (ref 70–99)
Potassium: 3.4 mmol/L — ABNORMAL LOW (ref 3.5–5.1)
Sodium: 140 mmol/L (ref 135–145)
Total Bilirubin: 1 mg/dL (ref 0.3–1.2)
Total Protein: 5.6 g/dL — ABNORMAL LOW (ref 6.5–8.1)

## 2018-06-14 MED ORDER — SODIUM CHLORIDE 0.9 % IV SOLN
INTRAVENOUS | Status: DC
  Administered 2018-06-14: 10:00:00 via INTRAVENOUS

## 2018-06-14 MED ORDER — POTASSIUM CHLORIDE 20 MEQ PO PACK
20.0000 meq | PACK | Freq: Two times a day (BID) | ORAL | Status: DC
  Administered 2018-06-14 – 2018-06-16 (×5): 20 meq via ORAL
  Filled 2018-06-14 (×5): qty 1

## 2018-06-14 NOTE — Care Management Important Message (Signed)
Copy of signed Medicare IM left with patient in room.  Patient expressed frustration over lack of transportation options in the area. Has had issues with [employer] being unavailable to assist with transporting in the past.  Chelsey Williams not an option as she lives in [place] where bus unavailable.  Explained [employer] may be a good option for transportation assistance. She expressed interest in learning more about their services.  Brochures for several [employer] companies offering transportation assistance for errands and appointments left with patient.  Encouraged her to reach out to them for more information on their services and rates.

## 2018-06-14 NOTE — Progress Notes (Addendum)
[facility] SURGICAL PROGRESS NOTE (cpt [id number])  Hospital Day(s): 2.   Post op day(s):  Marland Kitchen   Interval History: Patient seen and examined, no acute events or new complaints overnight. Patient reports she has unchanged right sided abdominal pain which she described as a pressure this morning. She does report that she felt the urge to have a BM this morning however it was only "large amounts of gas." This improved the pressure in her right side. No complaints of fever, chills, nausea, or emesis. Tolerating sips with medications. Sitting up on the side of the bed.  Review of Systems:  Constitutional: denies fever, chills  Respiratory: denies any shortness of breath  Cardiovascular: denies chest pain or palpitations  Gastrointestinal: + abdominal pain, denied N/V, or diarrhea/and bowel function as per interval history   Vital signs in last 24 hours: [min-max] current  Temp:  [97.7 F (36.5 C)-99 F (37.2 C)] 98.1 F (36.7 C) (02/21 0515) Pulse Rate:  [72-77] 77 (02/21 0515) Resp:  [18-20] 20 (02/21 0515) BP: (120-153)/(52-71) 153/71 (02/21 0515) SpO2:  [93 %] 93 % (02/21 0515)     Height: 5' 3"  (160 cm) Weight: 72.6 kg BMI (Calculated): 28.35   Intake/Output this shift:  No intake/output data recorded.   Intake/Output last 2 shifts:  @IOLAST2SHIFTS @   Physical Exam:  Constitutional: alert, cooperative and no distress  HENT: normocephalic without obvious abnormality  Respiratory: breathing non-labored at rest  Gastrointestinal: soft, Right sided diffuse abdominal pain, and non-distended, no rebound or guarding, no peritoneal signs Musculoskeletal: no edema or wounds, motor and sensation grossly intact, NT    Labs:  CBC Latest Ref Rng & Units 06/14/2018 06/13/2018 06/12/2018  WBC 4.0 - 10.5 K/uL 8.3 11.3(H) 13.7(H)  Hemoglobin 12.0 - 15.0 g/dL 11.1(L) 11.6(L) 13.5  Hematocrit 36.0 - 46.0 % 35.8(L) 36.9 42.8  Platelets 150 - 400 K/uL 284 312 354   CMP Latest Ref Rng  & Units 06/14/2018 06/13/2018 06/12/2018  Glucose 70 - 99 mg/dL 74 97 86  BUN 8 - 23 mg/dL 11 17 15   Creatinine 0.44 - 1.00 mg/dL 0.65 0.64 0.56  Sodium 135 - 145 mmol/L 140 137 140  Potassium 3.5 - 5.1 mmol/L 3.4(L) 3.5 3.3(L)  Chloride 98 - 111 mmol/L 111 108 114(H)  CO2 22 - 32 mmol/L 19(L) 21(L) 20(L)  Calcium 8.9 - 10.3 mg/dL 8.0(L) 8.2(L) 7.3(L)  Total Protein 6.5 - 8.1 g/dL 5.6(L) 5.6(L) 5.3(L)  Total Bilirubin 0.3 - 1.2 mg/dL 1.0 0.7 0.5  Alkaline Phos 38 - 126 U/L 42 45 45  AST 15 - 41 U/L 31 32 22  ALT 0 - 44 U/L 24 20 11      Imaging studies:   KUB (06/13/2018) personally reviewed and radiologist report reviewed:  IMPRESSION: Mildly dilated small bowel loops are noted concerning for distal small bowel obstruction or possibly ileus.   Assessment/Plan: (ICD-10's: K44.9) 85 y.o. female with persistent vs chronic abdominal pain, possibly attributable to enteritis, obstruction less likely given signs of bowel function, with mild hypokalemia and resolved leukocytosis, complicated by pertinent comorbidities including CAD s/p CABG in 2016, HLD, HTN, Atrial fibrillation s/p pacemaker placement, advanced age, obesity, and former tobacco abuse (smoking).   - Recommend she advance to clear liquids, cont. IVF  - If she develops worsening pain, distension, nausea, or emesis would make NPO and would recommend re-imaging with CT  - Continue IV ABx (Levaquin/Flagyl)             - Monitor abdominal  examination, ongoing bowel function   - Recommend K+ repletion, monitor   - Medical management per primary team             - General surgery will continue to follow   All of the above findings and recommendations were discussed with the patient, and the medical team, and all of patient's questions were answered to her expressed satisfaction.  -- Edison Simon, PA-C [facility] 06/14/2018, 10:21 AM [phone] M-F: 7am - 4pm  I saw and evaluated the patient.  I agree  with the above documentation, exam, and plan, which I have edited where appropriate. Fredirick Maudlin  12:09 PM

## 2018-06-14 NOTE — Progress Notes (Signed)
[place] at [place] NAME: Chelsey Williams    MR#:  [id number]  DATE OF BIRTH:  12-05-1927  SUBJECTIVE:  CHIEF COMPLAINT:   Chief Complaint  Patient presents with  . Abdominal Pain   Laying in bed resting comfortably when seen, awoken and then states "I feel awful this morning" + abdominal pain, leg pain, knee pain. Slept through the night. No BM since Sunday. + passing flatus. No repeat emesis.  REVIEW OF SYSTEMS:  CONSTITUTIONAL: No fever, fatigue or weakness.  EYES: No blurred or double vision.  EARS, NOSE, AND THROAT: No tinnitus or ear pain. Hard of hearing, has hearing aids.  RESPIRATORY: No cough, shortness of breath, wheezing or hemoptysis.  CARDIOVASCULAR: No chest pain, orthopnea, edema.  GASTROINTESTINAL:Endorsingnausea, vomiting,generalized abdominal pain,mainly in the right lower quadrant, nodiarrhea. Endorsing constipation x 5 days. GENITOURINARY: No dysuria, hematuria.  ENDOCRINE: No polyuria, nocturia,  HEMATOLOGY: No anemia, easy bruising or bleeding SKIN: No rash or lesion. MUSCULOSKELETAL: bilateral knee and leg pain, chronic. NEUROLOGIC: No tingling, numbness, weakness. Chronic left sided headache. PSYCHIATRY: No anxiety or depression.  DRUG ALLERGIES:   Allergies  Allergen Reactions  . Cephalexin Anaphylaxis    Shock / Unconsciousness  . Diltiazem Hcl Swelling  . Hydrochlorothiazide Other (See Comments)    dizziness dizziness  . Prednisone Shortness Of Breath    SOB  . Felodipine Nausea Only    Gastrointestinal problems, e.g., nausea, vomiting, diarrheaShock / Unconsciousnessmuscle tenderness  . Other Other (See Comments)    A blood pressure med A blood pressure med  . Statins Other (See Comments)  . Amlodipine Other (See Comments) and Palpitations  . Atenolol Palpitations    heart palpitations   VITALS:  Blood pressure (!) 154/68, pulse 75, temperature 98 F (36.7 C), temperature source Oral,  resp. rate 20, height 5' 3"  (1.6 m), weight 72.6 kg, SpO2 95 %. PHYSICAL EXAMINATION:  GENERAL:85 y.o.-year-old patient lying in the bed with no apparent acute distress.  EYES: Pupils equal, round, reactive to light and accommodation. No scleral icterus. Extraocular muscles intact.  HEENT: Head atraumatic, normocephalic. Oropharynx and nasopharynx clear.  NECK: Supple, no jugular venous distention. No thyroid enlargement, no tenderness.  LUNGS: Normal breath sounds bilaterally, no wheezing, rales, rhonchi or crepitation. No use of accessory muscles of respiration.  CARDIOVASCULAR: S1, S2 normal. No murmurs, rubs, or gallops.  ABDOMEN: Soft,right lower quadrant is tender with no rebound tenderness, distended. Also tender diffusely, greater in the epigastrium. Bowel sounds present.  EXTREMITIES: No pedal edema, cyanosis, or clubbing.  NEUROLOGIC: Cranial nerves II through XII are intact. Muscle strength 5/5 in all extremities. Sensation intact. Gait not checked.  PSYCHIATRIC: The patient is alert and oriented x 3.  SKIN: No obvious rash, lesion, or ulcer.  LABORATORY PANEL:  Female CBC Recent Labs  Lab 06/14/18 0715  WBC 8.3  HGB 11.1*  HCT 35.8*  PLT 284   ------------------------------------------------------------------------------------------------------------------ Chemistries  Recent Labs  Lab 06/14/18 0715  NA 140  K 3.4*  CL 111  CO2 19*  GLUCOSE 74  BUN 11  CREATININE 0.65  CALCIUM 8.0*  AST 31  ALT 24  ALKPHOS 42  BILITOT 1.0   RADIOLOGY:  Dg Abd 1 View  Result Date: 06/13/2018 CLINICAL DATA:  Right lower quadrant abdominal pain and distention. EXAM: ABDOMEN - 1 VIEW COMPARISON:  None. FINDINGS: Mildly dilated small bowel loops are noted concerning for distal small bowel obstruction or possibly ileus. No colonic dilatation is noted.  Status post cholecystectomy. IMPRESSION: Mildly dilated small bowel loops are noted concerning for distal small bowel obstruction  or possibly ileus. Electronically Signed   By: Chelsey Williams, M.D.   On: 06/13/2018 13:51   ASSESSMENT AND PLAN:   Chelsey Williams a85 y.o.femalewith a known history of coronary artery disease, chronic anxiety, hypertension, hyperlipidemia, chronic atrial fibrillation on Eliquis has been having generalized abdominal pain mainly on the right lower quadrant associated with nausea and vomiting for the past 2 to 3 days. Patient is unable to keep anything down. CT scan of the abdomen done at [facility] which has revealed intrahepatic and extrahepatic bile duct dilatation and dilated bowel loops consistent with enteritis but no bowel obstruction.As there was a long waiting list they could not wait in the [place] [facility] and went home and came into [facility].    #Acute abdominal pain consistent with enteritis on CT scan Believes her emesis smelled like stool. Concern for obstruction.  Was on IV levofloxacin and Flagyl, IV fluids - switched to IV ciprofloxacin and Flagyl per pharmacy recs - EKG daily for QTc monitoring, QT/QTc 418/466 ms today - diet advanced to clear liquids, continue IVF per GenSurg GI consult placed Dr. Bonna Williams: no indication for endoscopy. Avoid NSAIDs. Infectious disease workup with next BM, order stool studies if having BMs, stop Imodium. Signed off. - Surgery consult placed for dilated bowel loops as recommended by gastroenterology: Diet advanced, KUB completed, obtain CT and make patient NPO if worsening pain/nausea/emesis/distention,  Pain management as needed - lactate within normal limits - will defer bowel regimen, NG decompression to surgery. At this time recommending monitoring bowel function only.  #Hypokalemia repleted today. Recheck BMP  # Intermittent reflux Protonix.  #Confusion Back at baseline. - Neurology consult: CT normal. MRI not obtainable 2/2 pacemaker. Low concern for ischemic event. Concern for possible hypoperfusion. Monitor  #Chronic atrial fibrillation  rate controlled  continue home medication sotalol and Eliquis Repeat EKG in a.m. as QT interval is prolonged - 2/21 - QT/QTc 418/466 ms  #Anxiety - currently stable and not on any home medications at this time  #Essential hypertension - continue home medication hydralazine, lisinopril Hold hydralazine if soft BPs   All the records are reviewed and case is discussed with Care Management/Social Worker. Management plans discussed with the patient and/or family and they are in agreement.  CODE STATUS: Full Code  TOTAL TIME TAKING CARE OF THIS PATIENT: 30 minutes.   More than 50% of the time was spent in counseling/coordination of care: YES  POSSIBLE D/C IN 1-2 DAYS, DEPENDING ON CLINICAL CONDITION.   Chelsey Renville PA-C on 06/14/2018 at 1:30 PM  Between 7am to 6pm - Pager - [phone]  After 6 pm go to [email or web address] - Proofreader  [facility]  Office  [phone]  CC: Primary care physician; Patient, No Pcp Per  Note: This dictation was prepared with Dragon dictation along with smaller phrase technology. Any transcriptional errors that result from this process are unintentional.

## 2018-06-15 ENCOUNTER — Inpatient Hospital Stay: Payer: Medicare Other

## 2018-06-15 LAB — CBC
HCT: 37.8 % (ref 36.0–46.0)
HEMOGLOBIN: 11.8 g/dL — AB (ref 12.0–15.0)
MCH: 28.2 pg (ref 26.0–34.0)
MCHC: 31.2 g/dL (ref 30.0–36.0)
MCV: 90.2 fL (ref 80.0–100.0)
Platelets: 314 10*3/uL (ref 150–400)
RBC: 4.19 MIL/uL (ref 3.87–5.11)
RDW: 14.5 % (ref 11.5–15.5)
WBC: 11.4 10*3/uL — ABNORMAL HIGH (ref 4.0–10.5)
nRBC: 0 % (ref 0.0–0.2)

## 2018-06-15 LAB — BASIC METABOLIC PANEL
Anion gap: 5 (ref 5–15)
BUN: 7 mg/dL — ABNORMAL LOW (ref 8–23)
CHLORIDE: 112 mmol/L — AB (ref 98–111)
CO2: 23 mmol/L (ref 22–32)
Calcium: 8.2 mg/dL — ABNORMAL LOW (ref 8.9–10.3)
Creatinine, Ser: 0.58 mg/dL (ref 0.44–1.00)
GFR calc Af Amer: >60 mL/min (ref >60)
GFR calc non Af Amer: >60 mL/min (ref >60)
Glucose, Bld: 114 mg/dL — ABNORMAL HIGH (ref 70–99)
Potassium: 3.6 mmol/L (ref 3.5–5.1)
Sodium: 140 mmol/L (ref 135–145)

## 2018-06-15 MED ORDER — LABETALOL HCL 5 MG/ML IV SOLN
5.0000 mg | INTRAVENOUS | Status: DC | PRN
  Administered 2018-06-15: 10 mg via INTRAVENOUS
  Filled 2018-06-15: qty 4

## 2018-06-15 MED ORDER — CLONAZEPAM 0.5 MG PO TABS
0.5000 mg | ORAL_TABLET | Freq: Once | ORAL | Status: AC
  Administered 2018-06-15: 0.5 mg via ORAL
  Filled 2018-06-15: qty 1

## 2018-06-15 MED ORDER — ALPRAZOLAM 0.25 MG PO TABS
0.2500 mg | ORAL_TABLET | Freq: Two times a day (BID) | ORAL | Status: DC | PRN
  Administered 2018-06-15: 0.25 mg via ORAL
  Filled 2018-06-15: qty 1

## 2018-06-15 MED ORDER — FUROSEMIDE 10 MG/ML IJ SOLN
40.0000 mg | Freq: Once | INTRAMUSCULAR | Status: AC
  Administered 2018-06-15: 40 mg via INTRAVENOUS
  Filled 2018-06-15: qty 4

## 2018-06-15 MED ORDER — ALBUTEROL SULFATE (2.5 MG/3ML) 0.083% IN NEBU
2.5000 mg | INHALATION_SOLUTION | RESPIRATORY_TRACT | Status: DC | PRN
  Administered 2018-06-15: 2.5 mg via RESPIRATORY_TRACT

## 2018-06-15 NOTE — Progress Notes (Signed)
[facility] [place] NAME: Ilham Roughton    MR#:  [id number]  DATE OF BIRTH:  05-Jul-1927  SUBJECTIVE:  CHIEF COMPLAINT:   Chief Complaint  Patient presents with  . Abdominal Pain   No new complaints this morning.  Nursing staff later reported patient was anxious.  Placed on PRN low-dose of Xanax.  No nausea vomiting.  No abdominal pains.  Diet advanced to full liquids today.  REVIEW OF SYSTEMS:  Review of Systems  Constitutional: Negative for chills and fever.  HENT: Negative for hearing loss and tinnitus.   Eyes: Negative for blurred vision and double vision.  Respiratory: Negative for cough and hemoptysis.   Cardiovascular: Negative for chest pain, palpitations and orthopnea.  Gastrointestinal: Negative for heartburn, nausea and vomiting.  Genitourinary: Negative for dysuria, frequency and urgency.  Musculoskeletal: Negative for myalgias and neck pain.  Skin: Negative for itching and rash.  Neurological: Negative for dizziness and headaches.  Psychiatric/Behavioral: Negative for depression and hallucinations. The patient is nervous/anxious.       DRUG ALLERGIES:   Allergies  Allergen Reactions  . Cephalexin Anaphylaxis    Shock / Unconsciousness  . Diltiazem Hcl Swelling  . Hydrochlorothiazide Other (See Comments)    dizziness dizziness  . Prednisone Shortness Of Breath    SOB  . Felodipine Nausea Only    Gastrointestinal problems, e.g., nausea, vomiting, diarrheaShock / Unconsciousnessmuscle tenderness  . Other Other (See Comments)    A blood pressure med A blood pressure med  . Statins Other (See Comments)  . Amlodipine Other (See Comments) and Palpitations  . Atenolol Palpitations    heart palpitations   VITALS:  Blood pressure 107/69, pulse (!) 121, temperature 98.6 F (37 C), temperature source Oral, resp. rate (!) 24, height 5' 3"  (1.6 m), weight 72.6 kg, SpO2 96 %. PHYSICAL EXAMINATION:   GENERAL:85  y.o.-year-old patient lying in the bed with no apparent acute distress.  EYES: Pupils equal, round, reactive to light and accommodation. No scleral icterus. Extraocular muscles intact.  HEENT: Head atraumatic, normocephalic. Oropharynx and nasopharynx clear.  NECK: Supple, no jugular venous distention. No thyroid enlargement, no tenderness.  LUNGS: Normal breath sounds bilaterally, no wheezing, rales, rhonchi or crepitation. No use of accessory muscles of respiration.  CARDIOVASCULAR: S1, S2 normal. No murmurs, rubs, or gallops.  ABDOMEN: Soft,right lower quadrant is tender with no rebound tenderness, distended.Also tender diffusely, greater in the epigastrium. Bowel sounds present. EXTREMITIES: No pedal edema, cyanosis, or clubbing.  NEUROLOGIC: Cranial nerves II through XII are intact. Muscle strength 5/5 in all extremities. Sensation intact. Gait not checked.  PSYCHIATRIC: The patient is alert and oriented x 3.  SKIN: No obvious rash, lesion, or ulcer.  LABORATORY PANEL:  Female CBC Recent Labs  Lab 06/15/18 0410  WBC 11.4*  HGB 11.8*  HCT 37.8  PLT 314   ------------------------------------------------------------------------------------------------------------------ Chemistries  Recent Labs  Lab 06/14/18 0715 06/15/18 0410  NA 140 140  K 3.4* 3.6  CL 111 112*  CO2 19* 23  GLUCOSE 74 114*  BUN 11 7*  CREATININE 0.65 0.58  CALCIUM 8.0* 8.2*  AST 31  --   ALT 24  --   ALKPHOS 42  --   BILITOT 1.0  --    RADIOLOGY:  Dg Chest Port 1 View  Result Date: 06/15/2018 CLINICAL DATA:  85 y/o  F; wheezing and chest pain. EXAM: PORTABLE CHEST 1 VIEW COMPARISON:  01/23/2018 chest radiograph FINDINGS: Stable cardiac silhouette  given projection and technique. Two lead pacemaker noted. Aortic atherosclerosis with calcification. Reticular opacities of the lungs with basilar predominance. No focal consolidation. No pleural effusion or pneumothorax. No acute osseous abnormality is  evident. Surgical clips project over the right lower neck. Dextrocurvature of thoracolumbar junction. IMPRESSION: Reticular opacities of the lungs with basilar predominance may represent interstitial edema or atypical pneumonia. Electronically Signed   By: Kristine Garbe M.D.   On: 06/15/2018 04:45   ASSESSMENT AND PLAN:   1.Acute abdominal pain consistent with enteritis on CT scan Clinically no evidence of obstruction at this time.  Being followed by surgical team.  Diet advanced from clears to full liquid diet today. Was onIV levofloxacin and Flagyl, IV fluids- switched to IV ciprofloxacin and Flagyl per pharmacy recs GI consult placedDr. Bonna Gains: no indication for endoscopy. Avoid NSAIDs. Infectious disease workup with next BM, order stool studies if having BMs, stop Imodium. Signed off.  2.Hypokalemia repleted  3. Intermittent reflux Protonix.  4.Confusion Back at baseline. - Neurology consult: CT normal. MRI not obtainable 2/2 pacemaker. Low concern for ischemic event. Concern for possible hypoperfusion. Monitor  5. Chronic atrial fibrillation rate controlled  continue home medication sotalol and Eliquis  6.Anxiety -PRN low-dose Xanax  7.Essential hypertension - continue home medication hydralazine, lisinopril Hold hydralazine if soft BPs  DVT prophylaxis; patient on Eliquis   All the records are reviewed and case discussed with Care Management/Social Worker. Management plans discussed with the patient, family and they are in agreement.  CODE STATUS: Full Code  TOTAL TIME TAKING CARE OF THIS PATIENT: 36 minutes.   More than 50% of the time was spent in counseling/coordination of care: YES  POSSIBLE D/C IN 1-2 DAYS, DEPENDING ON CLINICAL CONDITION.   Lynsi Dooner M.D on 06/15/2018 at 3:18 PM  Between 7am to 6pm - Pager - [phone]  After 6pm go to [email or web address] - Proofreader  [facility]  Office   [phone]  CC: Primary care physician; Patient, No Pcp Per  Note: This dictation was prepared with Dragon dictation along with smaller phrase technology. Any transcriptional errors that result from this process are unintentional.

## 2018-06-15 NOTE — Progress Notes (Signed)
Patient is still wheezing and stating that it is hard to catch her breath. Dr. Marcille Blanco notified. Chest XR ordered. Will continue to monitor.

## 2018-06-15 NOTE — Progress Notes (Signed)
Patient called out to nurses station and stated that she couldn't breath. RN and NT entered room and checked VS. BP was elevated and O2 on RA was 97%. Placed patient on 2L for comfort. Lung fields with expiratory wheezes, which is a change from start of shift assessment of clear lung sounds. No PRN breathing treatments were ordered - Respiratory Therapy called as a second opinion and so breathing treatment could be given. RT arrived and agreed that upper airway sounded tight and wheezy - albuterol treatment was given.  Dr. Marcille Blanco was also paged about event. Discussed new onset of wheezing, anxiety, and elevated BP. MD placed orders for clonazepam and labetalol. Breathing treatment, clonazepam, and labetalol given. Patient still sounds wheezy after breathing treatment and states that it is hard to catch her breath. Patient also requested that her son be called during this event - tried to call son at home and on his cell phone, both twice, with no answer. Also during this event patient kept saying "I've made a mistake" and "I shouldn't have come here." Will continue to monitor.

## 2018-06-15 NOTE — Progress Notes (Signed)
[facility] Hospital Day(s): 3.   Post op day(s):  Marland Kitchen   Interval History: Patient seen and examined, no acute events or new complaints overnight. Patient reports feeling much better today.  She reports passing gas and having bowel movement.  Reports that the clear liquid has been great.  Denies nausea or vomiting.  She does report feeling shortness of breath after the administration of morphine.  Vital signs in last 24 hours: [min-max] current  Temp:  [98 F (36.7 C)-98.5 F (36.9 C)] 98.1 F (36.7 C) (02/22 0617) Pulse Rate:  [70-78] 71 (02/22 0617) Resp:  [18-24] 18 (02/22 0617) BP: (115-207)/(51-113) 140/82 (02/22 0617) SpO2:  [95 %-99 %] 99 % (02/22 0617) FiO2 (%):  [28 %] 28 % (02/22 0100)     Height: 5' 3"  (160 cm) Weight: 72.6 kg BMI (Calculated): 28.35   Physical Exam:  Constitutional: alert, cooperative and no distress  Gastrointestinal: soft, non-tender, and non-distended  Labs:  CBC Latest Ref Rng & Units 06/15/2018 06/14/2018 06/13/2018  WBC 4.0 - 10.5 K/uL 11.4(H) 8.3 11.3(H)  Hemoglobin 12.0 - 15.0 g/dL 11.8(L) 11.1(L) 11.6(L)  Hematocrit 36.0 - 46.0 % 37.8 35.8(L) 36.9  Platelets 150 - 400 K/uL 314 284 312   CMP Latest Ref Rng & Units 06/15/2018 06/14/2018 06/13/2018  Glucose 70 - 99 mg/dL 114(H) 74 97  BUN 8 - 23 mg/dL 7(L) 11 17  Creatinine 0.44 - 1.00 mg/dL 0.58 0.65 0.64  Sodium 135 - 145 mmol/L 140 140 137  Potassium 3.5 - 5.1 mmol/L 3.6 3.4(L) 3.5  Chloride 98 - 111 mmol/L 112(H) 111 108  CO2 22 - 32 mmol/L 23 19(L) 21(L)  Calcium 8.9 - 10.3 mg/dL 8.2(L) 8.0(L) 8.2(L)  Total Protein 6.5 - 8.1 g/dL - 5.6(L) 5.6(L)  Total Bilirubin 0.3 - 1.2 mg/dL - 1.0 0.7  Alkaline Phos 38 - 126 U/L - 42 45  AST 15 - 41 U/L - 31 32  ALT 0 - 44 U/L - 24 20    Imaging studies: No new pertinent imaging studies   Assessment/Plan:  85 y.o. female with persistent vs chronic abdominal pain, possibly attributable to enteritis, complicated by pertinent  comorbidities including CAD s/p CABG in 2016, HLD, HTN, Atrial fibrillation s/p pacemaker placement, advanced age, obesity, and former tobacco abuse (smoking). Patient reports feeling better.  She is passing gas and having bowel movement.  From my standpoint her diet can be advanced to full liquid and advance as tolerated.  She was encouraged to ambulate. Agreed to continue current medical management as per hospitalist care team.  Arnold Long, MD

## 2018-06-15 NOTE — Progress Notes (Signed)
BP has dropped, lungs sounds are much better with only minimal wheezing to upper right lobe, patient's work of breathing has improved, and patient is resting comfortably now. Will continue to monitor.

## 2018-06-16 LAB — BASIC METABOLIC PANEL
Anion gap: 5 (ref 5–15)
BUN: 13 mg/dL (ref 8–23)
CHLORIDE: 106 mmol/L (ref 98–111)
CO2: 26 mmol/L (ref 22–32)
Calcium: 8.1 mg/dL — ABNORMAL LOW (ref 8.9–10.3)
Creatinine, Ser: 0.87 mg/dL (ref 0.44–1.00)
GFR calc Af Amer: >60 mL/min (ref >60)
GFR calc non Af Amer: 59 mL/min — ABNORMAL LOW (ref >60)
Glucose, Bld: 98 mg/dL (ref 70–99)
Potassium: 3.8 mmol/L (ref 3.5–5.1)
Sodium: 137 mmol/L (ref 135–145)

## 2018-06-16 LAB — MAGNESIUM: Magnesium: 1.6 mg/dL — ABNORMAL LOW (ref 1.7–2.4)

## 2018-06-16 MED ORDER — METRONIDAZOLE 500 MG PO TABS: 500.0000 mg | ORAL_TABLET | Freq: Three times a day (TID) | ORAL | 0 refills | Status: AC

## 2018-06-16 MED ORDER — MAGNESIUM SULFATE 2 GM/50ML IV SOLN
2.0000 g | Freq: Once | INTRAVENOUS | Status: AC
  Administered 2018-06-16: 2 g via INTRAVENOUS
  Filled 2018-06-16: qty 50

## 2018-06-16 MED ORDER — CIPROFLOXACIN HCL 500 MG PO TABS: 500.0000 mg | ORAL_TABLET | Freq: Two times a day (BID) | ORAL | 0 refills | Status: AC

## 2018-06-16 NOTE — Discharge Summary (Signed)
[facility] [place] NAME: Chelsey Williams    MR#:  [id number]  DATE OF BIRTH:  Oct 13, 1927  DATE OF ADMISSION:  06/12/2018   ADMITTING PHYSICIAN: Nicholes Mango, MD  DATE OF DISCHARGE: 06/16/2018  PRIMARY CARE PHYSICIAN: Patient, No Pcp Per   ADMISSION DIAGNOSIS:  Enteritis [K52.9] Generalized abdominal pain [R10.84] Intractable nausea and vomiting [R11.2] DISCHARGE DIAGNOSIS:  Active Problems:   Acute abdominal pain  SECONDARY DIAGNOSIS:   Past Medical History:  Diagnosis Date  . Anxiety   . Atypical chest pain 11/07/2016  . Bilateral hearing loss   . Coronary artery disease, non-occlusive    a. LHC 11/2014: R dom, LM nl, mLAD 30-40, dLAD minor irregs, LCx minor irregs, RCA minor irregs w/ associated ectasia  . Depression   . GERD (gastroesophageal reflux disease)   . Glaucoma   . Hyperlipemia   . Hypertension   . Macular degeneration   . OA (osteoarthritis)   . Pacemaker    a. Medtronic Adapta ADDRL1 dual-chamber pacemaker (serial number [id number]) with Medtronic 2979 atrial lead (serial number [id number]) and Medtronic 5076 ventricular lead (serial number [id number]), all implanted 12/08/2014 by Dr. Jacklynn Barnacle at [facility] [phone].    Marland Kitchen Palpitations 08/16/2016  . Persistent atrial fibrillation    a. s/p DCCV 02/2016; b. sotalol and Eliquis; c. CHADS2VASc --> 4 (HTN, age x 2, female)  . Sinus node dysfunction [facility])    HOSPITAL COURSE:  Chief complaint; abdominal pains  History of presenting complaint; Chelsey Williams  is a 85 y.o. female with a known history of coronary artery disease, chronic anxiety, hypertension, hyperlipidemia, chronic atrial fibrillation on Eliquis has been having generalized abdominal pain mainly on the right lower quadrant associated with nausea and vomiting for the past 2 to 3 days.  Patient initially went to [facility] emergency department 1 day prior to this admission and had CT scan of the abdomen done  which has revealed intrahepatic and extrahepatic bile duct dilatation and dilated bowel loops consistent with enteritis but no bowel obstruction.  As there was a long waiting list they could not wait in the [place] ED and went home and came into [facility] as patient is not feeling better.  Hospitalist team is called to admit the patient.. Please refer to the H&P for further details.   Hospital course; 1.Acute abdominal pain consistent with enteritis on CT scan Clinically no evidence of obstruction at this time.  Being followed by surgical team.  Diet advanced to regular consistency diet and patient tolerating well.  Was onIV levofloxacin and Flagyl, IV fluids- switched to IV ciprofloxacin and Flagyl per pharmacy recs.  Significantly improved clinically.  Being discharged on p.o. ciprofloxacin and Flagyl for 5 more days.  Seen by gastroenterologist during this admission.no indication for endoscopy. Avoid NSAIDs.  Patient cleared for discharge from surgical standpoint.  2.Hypokalemiarepleted Hypomagnesemia; replaced prior to discharge  3. Intermittent reflux Protonix.  4.Confusion Back at baseline. - Neurology consult: CT normal. MRI not obtainable 2/2 pacemaker. Low concern for ischemic event.  Patient remains completely asymptomatic and back to baseline.  5. Chronic atrial fibrillation rate controlled  continue home medication sotalol and Eliquis  6.Anxiety -PRN low-dose Xanax  7.Essential hypertension - continue home medication hydralazine, lisinopril   DISCHARGE CONDITIONS:  Stable CONSULTS OBTAINED:  Treatment Team:  Fredirick Maudlin, MD Alexis Goodell, MD DRUG ALLERGIES:   Allergies  Allergen Reactions  . Cephalexin Anaphylaxis    Shock / Unconsciousness  . Diltiazem Hcl  Swelling  . Hydrochlorothiazide Other (See Comments)    dizziness dizziness  . Prednisone Shortness Of Breath    SOB  . Felodipine Nausea Only    Gastrointestinal problems, e.g., nausea, vomiting,  diarrheaShock / Unconsciousnessmuscle tenderness  . Other Other (See Comments)    A blood pressure med A blood pressure med  . Statins Other (See Comments)  . Amlodipine Other (See Comments) and Palpitations  . Atenolol Palpitations    heart palpitations   DISCHARGE MEDICATIONS:   Allergies as of 06/16/2018      Reactions   Cephalexin Anaphylaxis   Shock / Unconsciousness   Diltiazem Hcl Swelling   Hydrochlorothiazide Other (See Comments)   dizziness dizziness   Prednisone Shortness Of Breath   SOB   Felodipine Nausea Only   Gastrointestinal problems, e.g., nausea, vomiting, diarrheaShock / Unconsciousnessmuscle tenderness   Other Other (See Comments)   A blood pressure med A blood pressure med   Statins Other (See Comments)   Amlodipine Other (See Comments), Palpitations   Atenolol Palpitations   heart palpitations      Medication List    TAKE these medications   albuterol 108 (90 Base) MCG/ACT inhaler Commonly known as:  PROVENTIL HFA;VENTOLIN HFA Inhale 2 puffs into the lungs every 6 (six) hours as needed for wheezing or shortness of breath.   apixaban 5 MG Tabs tablet Commonly known as:  ELIQUIS Take 1 tablet (5 mg total) by mouth 2 (two) times daily.   CALCIUM-VITAMIN D3 PO Take 1 each by mouth daily.   cholecalciferol 1000 units tablet Commonly known as:  VITAMIN D Take 1,000 Units by mouth daily.   cholestyramine 4 g packet Commonly known as:  QUESTRAN Take by mouth 2 (two) times daily.   ciprofloxacin 500 MG tablet Commonly known as:  CIPRO Take 1 tablet (500 mg total) by mouth 2 (two) times daily for 5 days.   Fish Oil 1200 MG Caps Take by mouth.   folic acid 1 MG tablet Commonly known as:  FOLVITE Take 1 mg by mouth daily.   hydrALAZINE 10 MG tablet Commonly known as:  APRESOLINE Take 20 mg by mouth 3 (three) times daily.   HYDROcodone-acetaminophen 5-325 MG tablet Commonly known as:  NORCO/VICODIN take 1 tablet by mouth every 6 hours if  needed for pain   ibuprofen 200 MG tablet Commonly known as:  ADVIL,MOTRIN Take 200 mg by mouth every 6 (six) hours as needed for fever or mild pain.   ipratropium 17 MCG/ACT inhaler Commonly known as:  ATROVENT HFA Inhale 2 puffs into the lungs 4 (four) times daily.   Lecithin 1200 MG Caps Take by mouth.   lisinopril 40 MG tablet Commonly known as:  PRINIVIL,ZESTRIL Take 40 mg by mouth daily.   loperamide 2 MG capsule Commonly known as:  IMODIUM Take 2 mg by mouth as needed for diarrhea or loose stools.   metroNIDAZOLE 500 MG tablet Commonly known as:  FLAGYL Take 1 tablet (500 mg total) by mouth 3 (three) times daily for 5 days.   olopatadine 0.1 % ophthalmic solution Commonly known as:  PATANOL Place 1 drop into the right eye 2 (two) times daily.   omeprazole 20 MG capsule Commonly known as:  PRILOSEC Take 20 mg by mouth daily as needed.   psyllium 58.6 % packet Commonly known as:  METAMUCIL Take 1 packet by mouth daily as needed.   sotalol 80 MG tablet Commonly known as:  BETAPACE TAKE 1 TABLET EVERY 12 HOURS  vitamin B-12 1000 MCG tablet Commonly known as:  CYANOCOBALAMIN Take 1,000 mcg by mouth daily.        DISCHARGE INSTRUCTIONS:   DIET:  Cardiac diet DISCHARGE CONDITION:  Stable ACTIVITY:  Activity as tolerated OXYGEN:  Home Oxygen: No.  Oxygen Delivery: room air DISCHARGE LOCATION:  home   If you experience worsening of your admission symptoms, develop shortness of breath, life threatening emergency, suicidal or homicidal thoughts you must seek medical attention immediately by calling 911 or calling your MD immediately  if symptoms less severe.  You Must read complete instructions/literature along with all the possible adverse reactions/side effects for all the Medicines you take and that have been prescribed to you. Take any new Medicines after you have completely understood and accpet all the possible adverse reactions/side effects.    Please note  You were cared for by a hospitalist during your hospital stay. If you have any questions about your discharge medications or the care you received while you were in the hospital after you are discharged, you can call the unit and asked to speak with the hospitalist on call if the hospitalist that took care of you is not available. Once you are discharged, your primary care physician will handle any further medical issues. Please note that NO REFILLS for any discharge medications will be authorized once you are discharged, as it is imperative that you return to your primary care physician (or establish a relationship with a primary care physician if you do not have one) for your aftercare needs so that they can reassess your need for medications and monitor your lab values.    On the day of Discharge:  VITAL SIGNS:  Blood pressure (!) 148/67, pulse 73, temperature 97.8 F (36.6 C), temperature source Oral, resp. rate 16, height 5' 3"  (1.6 m), weight 72.6 kg, SpO2 95 %. PHYSICAL EXAMINATION:  GENERAL:  85 y.o.-year-old patient lying in the bed with no acute distress.  EYES: Pupils equal, round, reactive to light and accommodation. No scleral icterus. Extraocular muscles intact.  HEENT: Head atraumatic, normocephalic. Oropharynx and nasopharynx clear.  NECK:  Supple, no jugular venous distention. No thyroid enlargement, no tenderness.  LUNGS: Normal breath sounds bilaterally, no wheezing, rales,rhonchi or crepitation. No use of accessory muscles of respiration.  CARDIOVASCULAR: S1, S2 normal. No murmurs, rubs, or gallops.  ABDOMEN: Soft, non-tender, non-distended. Bowel sounds present. No organomegaly or mass.  EXTREMITIES: No pedal edema, cyanosis, or clubbing.  NEUROLOGIC: Cranial nerves II through XII are intact. Muscle strength 5/5 in all extremities. Sensation intact. Gait not checked.  PSYCHIATRIC: The patient is alert and oriented x 3.  SKIN: No obvious rash, lesion, or  ulcer.  DATA REVIEW:   CBC Recent Labs  Lab 06/15/18 0410  WBC 11.4*  HGB 11.8*  HCT 37.8  PLT 314    Chemistries  Recent Labs  Lab 06/14/18 0715  06/16/18 0507  NA 140   < > 137  K 3.4*   < > 3.8  CL 111   < > 106  CO2 19*   < > 26  GLUCOSE 74   < > 98  BUN 11   < > 13  CREATININE 0.65   < > 0.87  CALCIUM 8.0*   < > 8.1*  MG  --   --  1.6*  AST 31  --   --   ALT 24  --   --   ALKPHOS 42  --   --   BILITOT  1.0  --   --    < > = values in this interval not displayed.     Microbiology Results  No results found for this or any previous visit.  RADIOLOGY:  No results found.   Management plans discussed with the patient, family and they are in agreement.  CODE STATUS: Full Code   TOTAL TIME TAKING CARE OF THIS PATIENT: 38 minutes.    Jhordan Mckibben M.D on 06/16/2018 at 10:23 AM  Between 7am to 6pm - Pager - [phone]  After 6pm go to [email or web address] - Proofreader  [facility]  Office  [phone]  CC: Primary care physician; Patient, No Pcp Per   Note: This dictation was prepared with Dragon dictation along with smaller phrase technology. Any transcriptional errors that result from this process are unintentional.

## 2018-06-16 NOTE — Progress Notes (Signed)
Patient refused bed alarm this morning. Patient walked several laps around nursing station last night with standard walker. Patient requesting to be released today

## 2018-06-16 NOTE — Progress Notes (Signed)
[facility] Hospital Day(s): 4.   Post op day(s):  Chelsey Williams   Interval History: Patient seen and examined, no acute events or new complaints overnight. Patient reports significant improvement of clinical status and abdominal pain.  Patient tolerated full liquid diet.  Today found ambulating on the room more active, alert and anxious to go home.  Nausea or vomiting  Vital signs in last 24 hours: [min-max] current  Temp:  [97.3 F (36.3 C)-98.6 F (37 C)] 97.8 F (36.6 C) (02/23 0557) Pulse Rate:  [71-128] 73 (02/23 0557) Resp:  [16-24] 16 (02/23 0557) BP: (101-148)/(59-90) 148/67 (02/23 0557) SpO2:  [95 %-98 %] 95 % (02/23 0557)     Height: 5' 3"  (160 cm) Weight: 72.6 kg BMI (Calculated): 28.35    Physical Exam:  Constitutional: alert, cooperative and no distress  Respiratory: breathing non-labored at rest  Cardiovascular: regular rate and sinus rhythm  Gastrointestinal: soft, non-tender, and non-distended  Labs:  CBC Latest Ref Rng & Units 06/15/2018 06/14/2018 06/13/2018  WBC 4.0 - 10.5 K/uL 11.4(H) 8.3 11.3(H)  Hemoglobin 12.0 - 15.0 g/dL 11.8(L) 11.1(L) 11.6(L)  Hematocrit 36.0 - 46.0 % 37.8 35.8(L) 36.9  Platelets 150 - 400 K/uL 314 284 312   CMP Latest Ref Rng & Units 06/16/2018 06/15/2018 06/14/2018  Glucose 70 - 99 mg/dL 98 114(H) 74  BUN 8 - 23 mg/dL 13 7(L) 11  Creatinine 0.44 - 1.00 mg/dL 0.87 0.58 0.65  Sodium 135 - 145 mmol/L 137 140 140  Potassium 3.5 - 5.1 mmol/L 3.8 3.6 3.4(L)  Chloride 98 - 111 mmol/L 106 112(H) 111  CO2 22 - 32 mmol/L 26 23 19(L)  Calcium 8.9 - 10.3 mg/dL 8.1(L) 8.2(L) 8.0(L)  Total Protein 6.5 - 8.1 g/dL - - 5.6(L)  Total Bilirubin 0.3 - 1.2 mg/dL - - 1.0  Alkaline Phos 38 - 126 U/L - - 42  AST 15 - 41 U/L - - 31  ALT 0 - 44 U/L - - 24    Imaging studies: No new pertinent imaging studies   Assessment/Plan:  85 y.o.femalewith persistent vs chronic abdominal pain, possibly attributable to enteritis, complicated by pertinent  comorbidities includingCAD s/p CABG in 2016, HLD, HTN, Atrial fibrillation s/p pacemaker placement, advanced age, obesity, and former tobacco abuse (smoking). Patient significantly improved.  Tolerated diet.  Can advance diet as tolerated.  From surgical standpoint there is no contraindication for discharge at this time.  Arnold Long, MD

## 2018-06-16 NOTE — Progress Notes (Signed)
Pt discharged per MD order. IV removed. Discharge instructions reviewed with pt. Prescriptions given to pt. Pt verbalized understanding of instructions with questions answered to satisfaction. Pt taken to car in wheelchair by staff.

## 2018-06-18 ENCOUNTER — Ambulatory Visit (INDEPENDENT_AMBULATORY_CARE_PROVIDER_SITE_OTHER): Payer: Medicare Other | Admitting: *Deleted

## 2018-06-18 ENCOUNTER — Encounter: Payer: Self-pay | Admitting: Internal Medicine

## 2018-06-18 ENCOUNTER — Ambulatory Visit (INDEPENDENT_AMBULATORY_CARE_PROVIDER_SITE_OTHER): Payer: Medicare Other | Admitting: Internal Medicine

## 2018-06-18 VITALS — BP 140/80 | HR 80 | Ht 63.0 in | Wt 168.0 lb

## 2018-06-18 DIAGNOSIS — I495 Sick sinus syndrome: Secondary | ICD-10-CM | POA: Diagnosis not present

## 2018-06-18 DIAGNOSIS — I4819 Other persistent atrial fibrillation: Secondary | ICD-10-CM | POA: Diagnosis not present

## 2018-06-18 DIAGNOSIS — Z950 Presence of cardiac pacemaker: Secondary | ICD-10-CM

## 2018-06-18 DIAGNOSIS — Z79899 Other long term (current) drug therapy: Secondary | ICD-10-CM | POA: Diagnosis not present

## 2018-06-18 MED ORDER — MAGNESIUM OXIDE 400 MG PO TABS: 400.0000 mg | ORAL_TABLET | Freq: Every day | ORAL | 2 refills | Status: DC

## 2018-06-18 NOTE — Patient Instructions (Addendum)
Medication Instructions:  - Your physician has recommended you make the following change in your medication:   1) Lisinopril 40 mg  - On days your systolic blood pressure (top number) is < 120- DO NOT take lisinopril  - On days your systolic blood pressure (top number) is < 160- take 1/2 tablet (20 mg) once daily  - On days your systolic blood pressure (top number) is > 160- take 1 tablet (40 mg) once daily   If you need a refill on your cardiac medications before your next appointment, please call your pharmacy.   Lab work: - Your physician recommends that you return for lab work in: 2 weeks- BMP/ Magnesium  If you have labs (blood work) drawn today and your tests are completely normal, you will receive your results only by: Marland Kitchen MyChart Message (if you have MyChart) OR . A paper copy in the mail If you have any lab test that is abnormal or we need to change your treatment, we will call you to review the results.  Testing/Procedures: - none ordered  Follow-Up: At [facility], you and your health needs are our priority.  As part of our continuing mission to provide you with exceptional heart care, we have created designated Provider Care Teams.  These Care Teams include your primary Cardiologist (physician) and Advanced Practice Providers (APPs -  Physician Assistants and Nurse Practitioners) who all work together to provide you with the care you need, when you need it. . You will need a follow up appointment in 6 months with Dr. Caryl Comes  Please call our office 2 months in advance to schedule this appointment.    Remote monitoring is used to monitor your Pacemaker of ICD from home. This monitoring reduces the number of office visits required to check your device to one time per year. It allows [place] to keep an eye on the functioning of your device to ensure it is working properly. You are scheduled for a device check from home on 09/17/2018. You may send your transmission at any time that day.  If you have a wireless device, the transmission will be sent automatically. After your physician reviews your transmission, you will receive a postcard with your next transmission date.   Any Other Special Instructions Will Be Listed Below (If Applicable). - Please try to increase the magnesium in your diet (see attached)

## 2018-06-18 NOTE — Progress Notes (Signed)
ELECTROPHYSIOLOGY OFFICE NOTE  Patient ID: Chelsey Williams, MRN: [id number], DOB/AGE: [birth year] 85 y.o. Admit date: (Not on file) Date of Consult: 06/18/2018  Primary Physician: Patient, No Pcp Per Primary Cardiologist: new        HPI Chelsey Williams is a 85 y.o. female for follow-up of a pacemaker implanted previously in [place].  Also history of atrial fibrillation followed at [facility] by Dr. Omelia Blackwater and treated with sotalol and underwent cardioversion    DATE TEST EF   8/16 Echo   55-65 % Septal hypertrophy AS mild  8/16 LHC   Cors Non obstructive          Date Cr K Mg Hgb  7/18 0.64 3.8  12.2   2/20 0.87 3.8 1.6 11.8   Hospitalized last week with enteritis presenting with vomiting and pain.  Dilated bowel loops without obstruction.  Now fatigued; prior to her hospitalization, denied chest pain shortness of breath or peripheral edema.  Struggles with labile blood pressure.  Frequently she finds it under 100.  She takes lisinopril daily and Apresoline 3 times a day previously done variably but more recently done regularly.  No bleeding  Thromboembolic risk factors ( age  -2, HTN-1,  Gender-1 ) for a CHADSVASc Score of  4   Past Medical History:  Diagnosis Date  . Anxiety   . Atypical chest pain 11/07/2016  . Bilateral hearing loss   . Coronary artery disease, non-occlusive    a. LHC 11/2014: R dom, LM nl, mLAD 30-40, dLAD minor irregs, LCx minor irregs, RCA minor irregs w/ associated ectasia  . Depression   . GERD (gastroesophageal reflux disease)   . Glaucoma   . Hyperlipemia   . Hypertension   . Macular degeneration   . OA (osteoarthritis)   . Pacemaker    a. Medtronic Adapta ADDRL1 dual-chamber pacemaker (serial number [id number]) with Medtronic 9373 atrial lead (serial number [id number]) and Medtronic 5076 ventricular lead (serial number [id number]), all implanted 12/08/2014 by Dr. Jacklynn Barnacle at [facility] [phone].    Marland Kitchen Palpitations 08/16/2016  . Persistent  atrial fibrillation    a. s/p DCCV 02/2016; b. sotalol and Eliquis; c. CHADS2VASc --> 4 (HTN, age x 2, female)  . Sinus node dysfunction [facility])       Surgical History:  Past Surgical History:  Procedure Laterality Date  . CHOLECYSTECTOMY    . knee replacmen    . PACEMAKER IMPLANT  2014  . thyroid nodule       Home Meds: Prior to Admission medications   Medication Sig Start Date End Date Taking? Authorizing Provider  albuterol (PROVENTIL HFA;VENTOLIN HFA) 108 (90 Base) MCG/ACT inhaler Inhale into the lungs. 02/21/16 02/20/17 Yes [provider]  apixaban (ELIQUIS) 5 MG TABS tablet Take 5 mg by mouth 2 (two) times daily.  04/06/16  Yes [provider]  cholecalciferol (VITAMIN D) 1000 units tablet Take 1,000 Units by mouth daily.   Yes [provider]  cholestyramine (QUESTRAN) 4 g packet Take by mouth 2 (two) times daily.  11/13/16  Yes [provider]  folic acid (FOLVITE) 1 MG tablet Take 1 mg by mouth daily.   Yes [provider]  hydrALAZINE (APRESOLINE) 10 MG tablet Take 1 tablet (10 mg total) by mouth 3 (three) times daily. 08/16/16  Yes Vaughan Basta, MD  HYDROcodone-acetaminophen (NORCO/VICODIN) 5-325 MG tablet take 1 tablet by mouth every 6 hours if needed for pain 10/31/16  Yes [provider]  ibuprofen (ADVIL,MOTRIN) 200  MG tablet Take 200 mg by mouth every 6 (six) hours as needed for fever or mild pain.    Yes [provider]  Lecithin 1200 MG CAPS Take by mouth.   Yes [provider]  lisinopril (PRINIVIL,ZESTRIL) 20 MG tablet Take 1 tablet (20 mg total) by mouth 2 (two) times daily. 11/07/16  Yes Sainani, Belia Heman, MD  olopatadine (PATANOL) 0.1 % ophthalmic solution Place 1 drop into the right eye 2 (two) times daily.  11/07/16  Yes [provider]  Omega-3 Fatty Acids (FISH OIL) 1200 MG CAPS Take by mouth.   Yes [provider]  omeprazole (PRILOSEC) 20 MG capsule Take 20 mg by  mouth daily as needed.    Yes [provider]  psyllium (METAMUCIL) 58.6 % packet Take 1 packet by mouth daily as needed.   Yes [provider]  sotalol (BETAPACE) 80 MG tablet Take 80 mg by mouth every 12 (twelve) hours. 03/24/16  Yes [provider]  vitamin B-12 (CYANOCOBALAMIN) 1000 MCG tablet Take 1,000 mcg by mouth daily.   Yes [provider]    Allergies:  Allergies  Allergen Reactions  . Cephalexin Anaphylaxis    Shock / Unconsciousness  . Diltiazem Hcl Swelling  . Hydrochlorothiazide Other (See Comments)    dizziness dizziness  . Prednisone Shortness Of Breath    SOB  . Felodipine Nausea Only    Gastrointestinal problems, e.g., nausea, vomiting, diarrheaShock / Unconsciousnessmuscle tenderness  . Other Other (See Comments)    A blood pressure med A blood pressure med  . Statins Other (See Comments)  . Amlodipine Other (See Comments) and Palpitations  . Atenolol Palpitations    heart palpitations    Social History   Socioeconomic History  . Marital status: Widowed    Spouse name: Not on file  . Number of children: Not on file  . Years of education: Not on file  . Highest education level: Not on file  Occupational History  . Not on file  Social Needs  . Financial resource strain: Not on file  . Food insecurity:    Worry: Not on file    Inability: Not on file  . Transportation needs:    Medical: Not on file    Non-medical: Not on file  Tobacco Use  . Smoking status: Former Smoker    Last attempt to quit: 11/15/1999    Years since quitting: 18.6  . Smokeless tobacco: Never Used  Substance and Sexual Activity  . Alcohol use: No  . Drug use: No  . Sexual activity: Not Currently  Lifestyle  . Physical activity:    Days per week: Not on file    Minutes per session: Not on file  . Stress: Not on file  Relationships  . Social connections:    Talks on phone: Not on file    Gets together: Not on file    Attends religious  service: Not on file    Active member of club or organization: Not on file    Attends meetings of clubs or organizations: Not on file    Relationship status: Not on file  . Intimate partner violence:    Fear of current or ex partner: Not on file    Emotionally abused: Not on file    Physically abused: Not on file    Forced sexual activity: Not on file  Other Topics Concern  . Not on file  Social History Narrative  . Not on file  Family History  Problem Relation Age of Onset  . Hypertension Mother   . Stroke Mother   . Hypertension Father      ROS:  Please see the history of present illness.     All other systems reviewed and negative.    Physical Exam:  Blood pressure 140/80, pulse 80, height 5' 3"  (1.6 m), weight 168 lb (76.2 kg). Well developed and well nourished in no acute distress HENT normal Neck supple with JVP-flat Clear Device pocket well healed; without hematoma or erythema.  There is no tethering  Regular rate and rhythm, no  gallop No murmur Abd-soft with active BS No Clubbing cyanosis  edema Skin-warm and dry A & Oriented  Grossly normal sensory and motor function      Labs: Cardiac Enzymes No results for input(s): CKTOTAL, CKMB, TROPONINI in the last 72 hours. CBC Lab Results  Component Value Date   WBC 11.4 (H) 06/15/2018   HGB 11.8 (L) 06/15/2018   HCT 37.8 06/15/2018   MCV 90.2 06/15/2018   PLT 314 06/15/2018   PROTIME: No results for input(s): LABPROT, INR in the last 72 hours. Chemistry  Recent Labs  Lab 06/14/18 0715  06/16/18 0507  NA 140   < > 137  K 3.4*   < > 3.8  CL 111   < > 106  CO2 19*   < > 26  BUN 11   < > 13  CREATININE 0.65   < > 0.87  CALCIUM 8.0*   < > 8.1*  PROT 5.6*  --   --   BILITOT 1.0  --   --   ALKPHOS 42  --   --   ALT 24  --   --   AST 31  --   --   GLUCOSE 74   < > 98   < > = values in this interval not displayed.   Lipids No results found for: CHOL, HDL, LDLCALC, TRIG BNP No results found  for: PROBNP Thyroid Function Tests: No results for input(s): TSH, T4TOTAL, T3FREE, THYROIDAB in the last 72 hours.  Invalid input(s): FREET3 Miscellaneous No results found for: DDIMER  Radiology/Studies:  Dg Abd 1 View  Result Date: 06/13/2018 CLINICAL DATA:  Right lower quadrant abdominal pain and distention. EXAM: ABDOMEN - 1 VIEW COMPARISON:  None. FINDINGS: Mildly dilated small bowel loops are noted concerning for distal small bowel obstruction or possibly ileus. No colonic dilatation is noted. Status post cholecystectomy. IMPRESSION: Mildly dilated small bowel loops are noted concerning for distal small bowel obstruction or possibly ileus. Electronically Signed   By: Marijo Conception, M.D.   On: 06/13/2018 13:51   Ct Head Wo Contrast  Result Date: 06/13/2018 CLINICAL DATA:  Unexplained loss of consciousness. EXAM: CT HEAD WITHOUT CONTRAST TECHNIQUE: Contiguous axial images were obtained from the base of the skull through the vertex without intravenous contrast. COMPARISON:  01/23/2018 FINDINGS: Brain: No evidence of acute infarction, hemorrhage, hydrocephalus, extra-axial collection or mass lesion/mass effect. Moderate brain parenchymal volume loss and deep white matter microangiopathy. Vascular: Calcific atherosclerotic disease of the intra cavernous carotid arteries. Skull: Normal. Negative for fracture or focal lesion. Sinuses/Orbits: No acute finding. Other: None. IMPRESSION: 1. No acute intracranial abnormality. 2. Moderate brain parenchymal atrophy and chronic microvascular disease. Electronically Signed   By: Fidela Salisbury M.D.   On: 06/13/2018 10:13   Dg Chest Port 1 View  Result Date: 06/15/2018 CLINICAL DATA:  85 y/o  F; wheezing and chest pain.  EXAM: PORTABLE CHEST 1 VIEW COMPARISON:  01/23/2018 chest radiograph FINDINGS: Stable cardiac silhouette given projection and technique. Two lead pacemaker noted. Aortic atherosclerosis with calcification. Reticular opacities of the  lungs with basilar predominance. No focal consolidation. No pleural effusion or pneumothorax. No acute osseous abnormality is evident. Surgical clips project over the right lower neck. Dextrocurvature of thoracolumbar junction. IMPRESSION: Reticular opacities of the lungs with basilar predominance may represent interstitial edema or atypical pneumonia. Electronically Signed   By: Kristine Garbe M.D.   On: 06/15/2018 04:45    EKG: Atrial paced at 80 21/07/39  Assessment and Plan:   Atrial fibrillation-persistent   Sinus node dysfunction  Pacemaker-Medtronic The patient's device was interrogated.  The information was reviewed. No changes were made in the programming.     Hypertension-labile  High Risk Medication Surveillance    Blood pressure remains labile.  We will use her lisinopril on a take as directed protocol, if blood pressure systolic is less than 618 she will not take it in the morning.  If it is 120-160 she will take 20 and if it is greater than 160 she will take 40.  She will continue to use her hydralazine to augment blood pressure control  On Anticoagulation;  No bleeding issues   Tolerating sotalol--but Mg was low  Will begin repletion with Mg rich containing food  K is borderline low, but may come up with resolution of GI illness and mag supplementation  No interval AFib  We spent more than 50% of our >25 min visit in face to face counseling regarding the above     Virl Axe

## 2018-06-19 LAB — CUP PACEART REMOTE DEVICE CHECK
Battery Voltage: 2.79 V
Brady Statistic AP VP Percent: 0 %
Brady Statistic AP VS Percent: 90 %
Brady Statistic AS VP Percent: 0 %
Date Time Interrogation Session: 20200225021825
Implantable Lead Implant Date: 20160816
Implantable Lead Location: 753859
Implantable Lead Location: 753860
Implantable Lead Model: 4574
Implantable Lead Model: 5076
Lead Channel Impedance Value: 480 Ohm
Lead Channel Impedance Value: 530 Ohm
Lead Channel Pacing Threshold Amplitude: 0.375 V
Lead Channel Pacing Threshold Amplitude: 0.75 V
Lead Channel Pacing Threshold Pulse Width: 0.4 ms
Lead Channel Pacing Threshold Pulse Width: 0.4 ms
Lead Channel Setting Pacing Amplitude: 2 V
Lead Channel Setting Pacing Amplitude: 2.5 V
Lead Channel Setting Pacing Pulse Width: 0.4 ms
Lead Channel Setting Sensing Sensitivity: 4 mV
MDC IDC LEAD IMPLANT DT: 20160816
MDC IDC MSMT BATTERY IMPEDANCE: 183 Ohm
MDC IDC MSMT BATTERY REMAINING LONGEVITY: 120 mo
MDC IDC PG IMPLANT DT: 20160816
MDC IDC STAT BRADY AS VS PERCENT: 10 %

## 2018-06-19 NOTE — Addendum Note (Signed)
Addended by: Emily Filbert on: 06/19/2018 07:35 AM   Modules accepted: Orders

## 2018-06-26 ENCOUNTER — Encounter: Payer: Self-pay | Admitting: Cardiology

## 2018-06-26 NOTE — Progress Notes (Signed)
Remote pacemaker transmission.   

## 2018-07-01 ENCOUNTER — Other Ambulatory Visit
Admission: RE | Admit: 2018-07-01 | Discharge: 2018-07-01 | Disposition: A | Discharge status: Home / Self Care | Payer: Medicare Other | Source: Ambulatory Visit | Attending: Internal Medicine | Admitting: Internal Medicine

## 2018-07-01 DIAGNOSIS — Z79899 Other long term (current) drug therapy: Secondary | ICD-10-CM

## 2018-07-01 DIAGNOSIS — I4819 Other persistent atrial fibrillation: Secondary | ICD-10-CM | POA: Diagnosis present

## 2018-07-01 LAB — BASIC METABOLIC PANEL
Anion gap: 8 (ref 5–15)
BUN: 19 mg/dL (ref 8–23)
CO2: 29 mmol/L (ref 22–32)
Calcium: 9 mg/dL (ref 8.9–10.3)
Chloride: 103 mmol/L (ref 98–111)
Creatinine, Ser: 0.8 mg/dL (ref 0.44–1.00)
GFR calc Af Amer: >60 mL/min (ref >60)
GFR calc non Af Amer: >60 mL/min (ref >60)
Glucose, Bld: 115 mg/dL — ABNORMAL HIGH (ref 70–99)
Potassium: 4.3 mmol/L (ref 3.5–5.1)
Sodium: 140 mmol/L (ref 135–145)

## 2018-07-01 LAB — MAGNESIUM: Magnesium: 1.7 mg/dL (ref 1.7–2.4)

## 2018-07-02 ENCOUNTER — Telehealth: Payer: Self-pay | Admitting: Internal Medicine

## 2018-07-02 NOTE — Telephone Encounter (Signed)
° °  [facility] Pre-operative Risk Assessment    Request for surgical clearance:  1. What type of surgery is being performed? Dental Treatment   2. When is this surgery scheduled? Not noted   3. What type of clearance is required (medical clearance vs. Pharmacy clearance to hold med vs. Both)? Medications and or clearance for Treatment   4. Are there any medications that need to be held prior to surgery and how long? Not noted   5. Practice name and name of physician performing surgery? [facility]   6. What is your office phone number [phone]   7.   What is your office fax number [phone]   8.   Anesthesia type (None, local, MAC, general) ? Not noted    Clarisse Gouge 07/02/2018, 4:45 PM  _________________________________________________________________   (provider comments below)

## 2018-07-03 LAB — CUP PACEART INCLINIC DEVICE CHECK
Battery Impedance: 183 Ohm
Battery Remaining Longevity: 121 mo
Battery Voltage: 2.79 V
Brady Statistic AP VP Percent: 0 %
Brady Statistic AP VS Percent: 90 %
Brady Statistic AS VP Percent: 0 %
Brady Statistic AS VS Percent: 10 %
Date Time Interrogation Session: 20200225182540
Implantable Lead Implant Date: 20160816
Implantable Lead Implant Date: 20160816
Implantable Lead Location: 753859
Implantable Lead Model: 4574
Implantable Lead Model: 5076
Implantable Pulse Generator Implant Date: 20160816
Lead Channel Impedance Value: 509 Ohm
Lead Channel Impedance Value: 556 Ohm
Lead Channel Pacing Threshold Amplitude: 0.5 V
Lead Channel Pacing Threshold Amplitude: 0.75 V
Lead Channel Pacing Threshold Pulse Width: 0.4 ms
Lead Channel Sensing Intrinsic Amplitude: 15.67 mV
Lead Channel Sensing Intrinsic Amplitude: 4 mV
Lead Channel Setting Pacing Amplitude: 2 V
Lead Channel Setting Pacing Amplitude: 2.5 V
Lead Channel Setting Pacing Pulse Width: 0.4 ms
Lead Channel Setting Sensing Sensitivity: 5.6 mV
MDC IDC LEAD LOCATION: 753860
MDC IDC MSMT LEADCHNL RV PACING THRESHOLD PULSEWIDTH: 0.4 ms

## 2018-07-03 NOTE — Telephone Encounter (Signed)
   Primary Cardiologist: Dr. Olin Pia  Chart reviewed as part of pre-operative protocol coverage. Given past medical history and time since last visit, based on ACC/AHA guidelines, Chelsey Williams would be at acceptable risk for the planned procedure without further cardiovascular testing.   Pt has permanent pacemaker and permanent atrial fib.  She is on Eliquis for anticoagulant. She does not need antibiotic prior to treatment.  Her anticoagulant should not be held for cleaning or one tooth extraction.  If more than that please contact our office.   I will route this recommendation to the requesting party via Epic fax function and remove from pre-op pool.  Please call with questions.  Cecilie Kicks, NP 07/03/2018, 9:24 AM

## 2018-07-05 DIAGNOSIS — M1711 Unilateral primary osteoarthritis, right knee: Secondary | ICD-10-CM | POA: Insufficient documentation

## 2018-09-17 ENCOUNTER — Ambulatory Visit (INDEPENDENT_AMBULATORY_CARE_PROVIDER_SITE_OTHER): Payer: Medicare Other | Admitting: *Deleted

## 2018-09-17 DIAGNOSIS — I4819 Other persistent atrial fibrillation: Secondary | ICD-10-CM

## 2018-09-17 DIAGNOSIS — I495 Sick sinus syndrome: Secondary | ICD-10-CM | POA: Diagnosis not present

## 2018-09-18 LAB — CUP PACEART REMOTE DEVICE CHECK
Battery Impedance: 232 Ohm
Battery Remaining Longevity: 112 mo
Battery Voltage: 2.79 V
Brady Statistic AP VP Percent: 0 %
Brady Statistic AP VS Percent: 93 %
Brady Statistic AS VP Percent: 0 %
Brady Statistic AS VS Percent: 7 %
Date Time Interrogation Session: 20200527035145
Implantable Lead Implant Date: 20160816
Implantable Lead Implant Date: 20160816
Implantable Lead Location: 753859
Implantable Lead Location: 753860
Implantable Lead Model: 4574
Implantable Lead Model: 5076
Implantable Pulse Generator Implant Date: 20160816
Lead Channel Impedance Value: 501 Ohm
Lead Channel Impedance Value: 591 Ohm
Lead Channel Pacing Threshold Amplitude: 0.375 V
Lead Channel Pacing Threshold Amplitude: 0.75 V
Lead Channel Pacing Threshold Pulse Width: 0.4 ms
Lead Channel Pacing Threshold Pulse Width: 0.4 ms
Lead Channel Setting Pacing Amplitude: 2 V
Lead Channel Setting Pacing Amplitude: 2.5 V
Lead Channel Setting Pacing Pulse Width: 0.4 ms
Lead Channel Setting Sensing Sensitivity: 5.6 mV

## 2018-09-27 NOTE — Progress Notes (Signed)
Remote pacemaker transmission.   

## 2018-10-21 ENCOUNTER — Telehealth: Payer: Self-pay | Admitting: Internal Medicine

## 2018-10-21 NOTE — Telephone Encounter (Signed)
Appears there are two encounters opened on the same issue.  Nurse at [facility] has already talked to patient's son and has routed that call to the device pool. Will close this encounter to prevent duplication.

## 2018-10-21 NOTE — Telephone Encounter (Signed)
LMOVM for pt's son if he needs assistance sending a transmission.

## 2018-10-21 NOTE — Telephone Encounter (Signed)
Patient c/o Palpitations:  High priority if patient c/o lightheadedness, shortness of breath, or chest pain  1) How long have you had palpitations/irregular HR/ Afib? Are you having the symptoms now? Past couple of days  2) Are you currently experiencing lightheadedness, SOB or CP? Yes, had some chest pain and SOB, last night couldn't speak very well due to unable to catch breath  3) Do you have a history of afib (atrial fibrillation) or irregular heart rhythm? Yes  4) Have you checked your BP or HR? (document readings if available): last night HR 107, BP today 154/93  5) Are you experiencing any other symptoms? Headache, weakness and fatigue.  Her BP machine for a few days has been indicating irregular heart rate.  States that her pacemaker is not working.  Please call so to discuss at [phone]

## 2018-10-21 NOTE — Telephone Encounter (Signed)
LMOVM for pt son to return call

## 2018-10-21 NOTE — Telephone Encounter (Signed)
Spoke with pt's son who states pt had a bad episode of AF last night with HR's in the low 100's and elevated BP's in the 160's. She is feeling better today, but is not sure she is back in NSR.  I have advised pt's son I would have the device clinic call and walk him through a manual transmission. He states he has been getting an error message when attempting it in the past.  If pt is in NSR, I will forward to Dr Caryl Comes for advisement. If pt remains in AF, I will contact [place] to see if she can be seen by someone in the office this week.  Pt's son has verbalized understanding and has no additional questions.

## 2018-10-21 NOTE — Telephone Encounter (Signed)
New message   Patient c/o Palpitations:  High priority if patient c/o lightheadedness, shortness of breath, or chest pain  1) How long have you had palpitations/irregular HR/ Afib? Are you having the symptoms now? A couple of days, no   2) Are you currently experiencing lightheadedness, SOB or CP? No   3) Do you have a history of afib (atrial fibrillation) or irregular heart rhythm? Yes   4) Have you checked your BP or HR? (document readings if available): 154/93b/p hr 74 Patient's son states that yesterday her hr was 107  5) Are you experiencing any other symptoms? Weakness

## 2018-10-22 NOTE — Telephone Encounter (Signed)
Pt son states he will do the transmission for the pt when he get home from work tonight.

## 2018-10-23 NOTE — Telephone Encounter (Signed)
LMOM  Transmission not received. Called to see if patient/son needed assistance in sending.

## 2018-10-24 ENCOUNTER — Other Ambulatory Visit: Payer: Self-pay | Admitting: Internal Medicine

## 2018-10-24 NOTE — Telephone Encounter (Signed)
Spoke w/ pt son and he stated that he has tried to send remote transmission w/ pt home monitor. Error code 3230. He is not w/ pt or near monitor at this time. He will call back on Monday. Pt son states that pt is fine at this time. If she has any issues he will call paramedics to come check on her.

## 2018-10-29 NOTE — Telephone Encounter (Signed)
I spoke with pt son to get a transmission with the pt home monitor. I gave him the number to [place] support in case he get an error code with the monitor again.

## 2018-10-29 NOTE — Telephone Encounter (Signed)
Pt son called again stating that the device clinic is giving him the run around. He told the scheduler that his mom either going to be seen by Dr. Caryl Comes in the [place] office or we going to give her a new monitor. We let him know that Dr. Caryl Comes only have clinic in [place] on Tuesday and Thursday and he is completely booked. We also told him [employer] tech support will help trouble shoot the monitor or send him a new monitor. I told him the best time to call [employer] tech support is 7 am in the morning when the call levels are lower. Raquel Sarna and I both looked at the device clinic for [facility] the 1st available appointment is 11-12-2018. The pt son did not want that appointment because he wants her to be seen this week. I told the scheduler that I will send this message to Dr. Caryl Comes and his nurse for recommendations and someone will give him a call back.

## 2018-10-29 NOTE — Telephone Encounter (Signed)
Chelsey Williams   Thoughts ? Thanks  SK

## 2018-10-29 NOTE — Telephone Encounter (Signed)
Pt son called stating he still received the error code 3230. He tried calling [employer] support but received an high wait time. I advised him to call 1st thing in the morning. That will be the best time to call [employer]. Pt son wants the pt to be seen in [facility] Monday.

## 2018-10-30 NOTE — Telephone Encounter (Signed)
As we are unable to order a new monitor for the patient and patient's son has not been able to contact [employer] tech support, submitted an electronic request for tech support to contact patient's son for troubleshooting assistance. Contact should be initiated in 1-2 business days.

## 2018-10-31 ENCOUNTER — Other Ambulatory Visit: Payer: Self-pay

## 2018-10-31 ENCOUNTER — Ambulatory Visit
Admission: EM | Admit: 2018-10-31 | Discharge: 2018-10-31 | Discharge status: Against Medical Advice | Payer: Medicare Other

## 2018-11-08 NOTE — Telephone Encounter (Signed)
[employer] mailed the pt a new monitor. Shipped date 11-07-2018

## 2018-11-12 NOTE — Telephone Encounter (Signed)
Transmission reviewed. AF episodes noted on 6/22, 6/28, and 10/21/18, longest ~5hrs duration on 6/28 beginning at 17:33 (correlating with patient's reported symptoms). V rates not well controlled during AT/AF episodes. Routed to Dr. Caryl Comes for review and recommendations.

## 2018-11-12 NOTE — Telephone Encounter (Signed)
Lets discontinue hydralazine and begin dilt 120 mg daily trhx

## 2018-11-12 NOTE — Telephone Encounter (Signed)
Transmission received 11-10-2018

## 2018-11-13 NOTE — Telephone Encounter (Signed)
LMOVM requesting call back from patient's son regarding medication change. [place] phone number for return call.

## 2018-11-15 NOTE — Telephone Encounter (Signed)
Ok to add on 8/27 at 9:00 am with Dr. Caryl Comes. Will forward to scheduling to please contact the patient/ her son to confirm.

## 2018-11-15 NOTE — Telephone Encounter (Signed)
Patient's son, Chelsey Williams, returned call. Pt continues to have good days and bad days. Sometimes related to cardiac symptoms (weakness, correlating with high and low BPs, elevated HR), but often due to other health issues. Pt's son has been reminding pt to take medications as she has been more forgetful recently.  Discussed med change. Pt's son in agreement with stopping hydralazine and starting diltiazem. Requests 30 day supply be sent in to [facility] in [place] and 90 day refills sent in to [place]. Aware to call for symptoms of hypotension or other issues with this change.  After disconnecting call, prior to sending in order to pharmacy, noted diltiazem is recorded as an allergy (caused swelling per notes). Spoke with Chelsey Williams to make him aware. Advised to continue hydralazine for now, will attempt to get pt scheduled with Dr. Caryl Comes in [place] in 11/2018 (per recall) to discuss any other potential med changes. Chelsey Williams is only able to bring pt in on Thursdays, requests OV on 12/19/18 if possible. Advised I will route this message to Dr. Leighton Ruff staff to schedule. He verbalizes understanding and denies further questions at this time.

## 2018-11-15 NOTE — Telephone Encounter (Signed)
lmov to call office to confirm appt time

## 2018-12-13 ENCOUNTER — Telehealth: Payer: Self-pay | Admitting: *Deleted

## 2018-12-13 NOTE — Telephone Encounter (Signed)
   Primary Cardiologist: Virl Axe, MD   Chart reviewed as part of pre-operative protocol coverage. Patient has an appointment to see Dr. Caryl Comes 12/19/2018. Will ask Dr. Caryl Comes to address preoperative assessment at that time.    Per pharmacy recommendations, patient can hold eliquis 1 day prior to her upcoming dental surgery. Eliquis should be restarted as soon as she is cleared to do so by her Dr. Jenelle Mages.   Abigail Butts, PA-C 12/13/2018, 2:35 PM

## 2018-12-13 NOTE — Telephone Encounter (Signed)
   [facility] Pre-operative Risk Assessment    Request for surgical clearance:  1. What type of surgery is being performed? DENTAL X-RAYS, 13 TEETH TO BE EXTRACTED   2. When is this surgery scheduled? TBD   3. What type of clearance is required (medical clearance vs. Pharmacy clearance to hold med vs. Both)? BOTH  4. Are there any medications that need to be held prior to surgery and how long? ELIQUIS    5. Practice name and name of physician performing surgery? [facility]; DR. Jeneen Rinks [facility]  6. What is your office phone number [phone]   7.   What is your office fax number [phone]  8.   Anesthesia type (None, local, MAC, general) ? LOCAL W/EPINEPHRINE   Julaine Hua 12/13/2018, 11:41 AM  _________________________________________________________________   (provider comments below)

## 2018-12-13 NOTE — Telephone Encounter (Signed)
Done

## 2018-12-13 NOTE — Telephone Encounter (Signed)
Patient with diagnosis of afib on Eliquis for anticoagulation.    Procedure: DENTAL X-RAYS, 13 TEETH TO BE EXTRACTED  Date of procedure: TBD  CHADS2-VASc score of  5 (HTN, AGE,  CAD, AGE, female)  Per office protocol, patient can hold Eliquis for 1 day prior to procedure.

## 2018-12-18 ENCOUNTER — Ambulatory Visit (INDEPENDENT_AMBULATORY_CARE_PROVIDER_SITE_OTHER): Payer: Medicare Other | Admitting: *Deleted

## 2018-12-18 DIAGNOSIS — I495 Sick sinus syndrome: Secondary | ICD-10-CM

## 2018-12-19 ENCOUNTER — Other Ambulatory Visit: Payer: Self-pay

## 2018-12-19 ENCOUNTER — Ambulatory Visit (INDEPENDENT_AMBULATORY_CARE_PROVIDER_SITE_OTHER): Payer: Medicare Other | Admitting: Internal Medicine

## 2018-12-19 ENCOUNTER — Encounter: Payer: Self-pay | Admitting: Internal Medicine

## 2018-12-19 VITALS — BP 142/83 | HR 84 | Ht 64.0 in | Wt 162.0 lb

## 2018-12-19 DIAGNOSIS — Z950 Presence of cardiac pacemaker: Secondary | ICD-10-CM

## 2018-12-19 DIAGNOSIS — Z79899 Other long term (current) drug therapy: Secondary | ICD-10-CM

## 2018-12-19 DIAGNOSIS — I712 Thoracic aortic aneurysm, without rupture, unspecified: Secondary | ICD-10-CM

## 2018-12-19 DIAGNOSIS — I495 Sick sinus syndrome: Secondary | ICD-10-CM

## 2018-12-19 DIAGNOSIS — I4819 Other persistent atrial fibrillation: Secondary | ICD-10-CM

## 2018-12-19 NOTE — Patient Instructions (Addendum)
Medication Instructions:  - Your physician recommends that you continue on your current medications as directed. Please refer to the Current Medication list given to you today.  If you need a refill on your cardiac medications before your next appointment, please call your pharmacy.   Lab work: - Your physician recommends that you have lab work today: Magnesium  If you have labs (blood work) drawn today and your tests are completely normal, you will receive your results only by: Marland Kitchen MyChart Message (if you have MyChart) OR . A paper copy in the mail If you have any lab test that is abnormal or we need to change your treatment, we will call you to review the results.  Testing/Procedures: - Non-Cardiac CT Angiography (CTA), is a special type of CT scan that uses a computer to produce multi-dimensional views of major blood vessels throughout the body. In CT angiography, a contrast material is injected through an IV to help visualize the blood vessels  Date: Thursday 12/26/18 Arrival Time: 7:45 am Location: [place] at [facility]- 1st desk on the right to check in  Prep: Liquids only for 4 hours prior to the test  Follow-Up: At [facility], you and your health needs are our priority.  As part of our continuing mission to provide you with exceptional heart care, we have created designated Provider Care Teams.  These Care Teams include your primary Cardiologist (physician) and Advanced Practice Providers (APPs -  Physician Assistants and Nurse Practitioners) who all work together to provide you with the care you need, when you need it.  You will need a follow up appointment in 6 months (late February/ early March) with Dr. Caryl Comes. Marland Kitchen Please call our office 2 months in advance to schedule this appointment. (Call in early December to schedule)    Any Other Special Instructions Will Be Listed Below (If Applicable).  - Try Pedialyte [facility] when your systolic blood pressure (top number) is <120

## 2018-12-19 NOTE — Progress Notes (Signed)
ELECTROPHYSIOLOGY OFFICE NOTE  Patient ID: Merion Caton, MRN: [id number], DOB/AGE: 1927/10/30 85 y.o. Admit date: (Not on file) Date of Consult: 12/19/2018  Primary Physician: Clarisse Gouge, MD          HPI Kadijah Shamoon is a 85 y.o. female for follow-up of a pacemaker implanted previously in [place].  Atrial fibrillation followed at [facility] by Dr. Omelia Blackwater; treated with sotalol and underwent cardioversion   She has had occasional palpitations often very brief lasting just seconds.  She did have a more prolonged episode recorded but occurred in the middle of the night.  She has dizziness; on and off her whole life.  Describes as environment spinning.  Aggravated by head motion occurring while lying and often seated.  Persists with close eyes.  Associated with nausea.  Hypotension.  At the last visit we came up with an algorithm limiting her antihypertensives to systolics greater than 476.  She says she feels much better with systolics in the 546-50 range and much weaker when it is in the 100-120 range.  She has been intolerant of steroids in the past.  She also has osteoarthritis.  She asks about Voltaren gel.  (See below)  DATE TEST EF   8/16 Echo   55-65 % Septal hypertrophy AS mild  8/16 LHC   Cors Non obstructive          Date Cr K Mg Hgb  7/18 0.64 3.8  12.2   2/20 0.87 3.8 1.6 11.8  8/20 0.7 3.9  13.5     Device interrogation 5/20  Scant aFib  anticoagulation w apixoban  No bleeding  Thromboembolic risk factors ( age  -2, HTN-1,  Gender-1 ) for a CHADSVASc Score of  4   Past Medical History:  Diagnosis Date  . Anxiety   . Atypical chest pain 11/07/2016  . Bilateral hearing loss   . Coronary artery disease, non-occlusive    a. LHC 11/2014: R dom, LM nl, mLAD 30-40, dLAD minor irregs, LCx minor irregs, RCA minor irregs w/ associated ectasia  . Depression   . GERD (gastroesophageal reflux disease)   . Glaucoma   . Hyperlipemia   . Hypertension   . Macular  degeneration   . OA (osteoarthritis)   . Pacemaker    a. Medtronic Adapta ADDRL1 dual-chamber pacemaker (serial number [id number]) with Medtronic 3546 atrial lead (serial number [id number]) and Medtronic 5076 ventricular lead (serial number [id number]), all implanted 12/08/2014 by Dr. Jacklynn Barnacle at [facility] [phone].    Marland Kitchen Palpitations 08/16/2016  . Persistent atrial fibrillation    a. s/p DCCV 02/2016; b. sotalol and Eliquis; c. CHADS2VASc --> 4 (HTN, age x 2, female)  . Sinus node dysfunction [facility])       Surgical History:  Past Surgical History:  Procedure Laterality Date  . CHOLECYSTECTOMY    . knee replacmen    . PACEMAKER IMPLANT  2014  . thyroid nodule       Home Meds: Prior to Admission medications   Medication Sig Start Date End Date Taking? Authorizing Provider  albuterol (PROVENTIL HFA;VENTOLIN HFA) 108 (90 Base) MCG/ACT inhaler Inhale into the lungs. 02/21/16 02/20/17 Yes [provider]  apixaban (ELIQUIS) 5 MG TABS tablet Take 5 mg by mouth 2 (two) times daily.  04/06/16  Yes [provider]  cholecalciferol (VITAMIN D) 1000 units tablet Take 1,000 Units by mouth daily.   Yes [provider]  cholestyramine (QUESTRAN) 4 g packet Take by mouth 2 (two)  times daily.  11/13/16  Yes [provider]  folic acid (FOLVITE) 1 MG tablet Take 1 mg by mouth daily.   Yes [provider]  hydrALAZINE (APRESOLINE) 10 MG tablet Take 1 tablet (10 mg total) by mouth 3 (three) times daily. 08/16/16  Yes Vaughan Basta, MD  HYDROcodone-acetaminophen (NORCO/VICODIN) 5-325 MG tablet take 1 tablet by mouth every 6 hours if needed for pain 10/31/16  Yes [provider]  ibuprofen (ADVIL,MOTRIN) 200 MG tablet Take 200 mg by mouth every 6 (six) hours as needed for fever or mild pain.    Yes [provider]  Lecithin 1200 MG CAPS Take by mouth.   Yes [provider]  lisinopril (PRINIVIL,ZESTRIL) 20 MG tablet Take 1  tablet (20 mg total) by mouth 2 (two) times daily. 11/07/16  Yes Sainani, Belia Heman, MD  olopatadine (PATANOL) 0.1 % ophthalmic solution Place 1 drop into the right eye 2 (two) times daily.  11/07/16  Yes [provider]  Omega-3 Fatty Acids (FISH OIL) 1200 MG CAPS Take by mouth.   Yes [provider]  omeprazole (PRILOSEC) 20 MG capsule Take 20 mg by mouth daily as needed.    Yes [provider]  psyllium (METAMUCIL) 58.6 % packet Take 1 packet by mouth daily as needed.   Yes [provider]  sotalol (BETAPACE) 80 MG tablet Take 80 mg by mouth every 12 (twelve) hours. 03/24/16  Yes [provider]  vitamin B-12 (CYANOCOBALAMIN) 1000 MCG tablet Take 1,000 mcg by mouth daily.   Yes [provider]    Allergies:  Allergies  Allergen Reactions  . Cephalexin Anaphylaxis    Shock / Unconsciousness  . Diltiazem Hcl Swelling  . Hydrochlorothiazide Other (See Comments)    dizziness dizziness  . Prednisone Shortness Of Breath    SOB  . Felodipine Nausea Only    Gastrointestinal problems, e.g., nausea, vomiting, diarrheaShock / Unconsciousnessmuscle tenderness  . Magnesium-Containing Compounds     Diarrhea  . Other Other (See Comments)    A blood pressure med A blood pressure med  . Statins Other (See Comments)  . Amlodipine Other (See Comments) and Palpitations  . Atenolol Palpitations    heart palpitations    Social History   Socioeconomic History  . Marital status: Widowed    Spouse name: Not on file  . Number of children: Not on file  . Years of education: Not on file  . Highest education level: Not on file  Occupational History  . Not on file  Social Needs  . Financial resource strain: Not on file  . Food insecurity    Worry: Not on file    Inability: Not on file  . Transportation needs    Medical: Not on file    Non-medical: Not on file  Tobacco Use  . Smoking status: Former Smoker    Quit date: 11/15/1999    Years  since quitting: 19.1  . Smokeless tobacco: Never Used  Substance and Sexual Activity  . Alcohol use: No  . Drug use: No  . Sexual activity: Not Currently  Lifestyle  . Physical activity    Days per week: Not on file    Minutes per session: Not on file  . Stress: Not on file  Relationships  . Social Herbalist on phone: Not on file    Gets together: Not on file    Attends religious service: Not on file    Active member of club  or organization: Not on file    Attends meetings of clubs or organizations: Not on file    Relationship status: Not on file  . Intimate partner violence    Fear of current or ex partner: Not on file    Emotionally abused: Not on file    Physically abused: Not on file    Forced sexual activity: Not on file  Other Topics Concern  . Not on file  Social History Narrative  . Not on file     Family History  Problem Relation Age of Onset  . Hypertension Mother   . Stroke Mother   . Hypertension Father      ROS:  Please see the history of present illness.     All other systems reviewed and negative.    BP (!) 142/83 (BP Location: Left Arm, Patient Position: Sitting, Cuff Size: Normal)   Pulse 84   Ht 5' 4"  (1.626 m)   Wt 162 lb (73.5 kg)   SpO2 98%   BMI 27.81 kg/m   Well developed and well nourished in no acute distress HENT normal asymmetric eyes Neck supple with JVP-flat Clear Device pocket well healed; without hematoma or erythema.  There is no tethering  Regular rate and rhythm, no   murmur Abd-soft with active BS No Clubbing cyanosis   edema Skin-warm and dry A & Oriented  Grossly normal sensory and motor function  ECG atrial paced at 77 25/07/40   Labs: Cardiac Enzymes No results for input(s): CKTOTAL, CKMB, TROPONINI in the last 72 hours. CBC Lab Results  Component Value Date   WBC 11.4 (H) 06/15/2018   HGB 11.8 (L) 06/15/2018   HCT 37.8 06/15/2018   MCV 90.2 06/15/2018   PLT 314 06/15/2018      Assessment  and Plan:   Atrial fibrillation-persistent   Sinus node dysfunction  Pacemaker-Medtronic    The patient's device was interrogated.  The information was reviewed. No changes were made in the programming.     Hypertension-labile  Dizziness consistent with vertigo  High Risk Medication Surveillance  Thoracic Aneurysm   Infrequent atrial fibrillation.  On Anticoagulation;  No bleeding issues   Potassium remains borderline low given her sotalol.  She will continue to work on potassium rich foods.  She has been intolerant of magnesium supplementation in the past.  She has been working on magnesium rich foods; we will check it today.  She has been trying to increase sodium intake.  This is been associate with some gastritis.  We discussed Pedialyte advance care as a hydration formula that she could take if her systolic blood pressure less than 120.  We will need to consider reprogramming her rate response.  It seems fast but exercise tolerance is not impaired.  Heart rate excursion overall is good.  Needs CTA for her thoracic aneurysm noted about  Told to avoid quinolones   We spent more than 50% of our >25 min visit in face to face counseling regarding the above     Virl Axe

## 2018-12-20 LAB — MAGNESIUM: Magnesium: 1.9 mg/dL (ref 1.6–2.3)

## 2018-12-24 LAB — CUP PACEART REMOTE DEVICE CHECK
Battery Impedance: 256 Ohm
Battery Remaining Longevity: 107 mo
Battery Voltage: 2.79 V
Brady Statistic AP VP Percent: 1 %
Brady Statistic AP VS Percent: 99 %
Brady Statistic AS VP Percent: 0 %
Brady Statistic AS VS Percent: 1 %
Date Time Interrogation Session: 20200901001805
Implantable Lead Implant Date: 20160816
Implantable Lead Implant Date: 20160816
Implantable Lead Location: 753859
Implantable Lead Location: 753860
Implantable Lead Model: 4574
Implantable Lead Model: 5076
Implantable Pulse Generator Implant Date: 20160816
Lead Channel Impedance Value: 494 Ohm
Lead Channel Impedance Value: 552 Ohm
Lead Channel Pacing Threshold Amplitude: 0.375 V
Lead Channel Pacing Threshold Amplitude: 0.625 V
Lead Channel Pacing Threshold Pulse Width: 0.4 ms
Lead Channel Pacing Threshold Pulse Width: 0.4 ms
Lead Channel Setting Pacing Amplitude: 2 V
Lead Channel Setting Pacing Amplitude: 2.5 V
Lead Channel Setting Pacing Pulse Width: 0.4 ms
Lead Channel Setting Sensing Sensitivity: 4 mV

## 2018-12-26 ENCOUNTER — Other Ambulatory Visit: Payer: Self-pay

## 2018-12-26 ENCOUNTER — Ambulatory Visit
Admission: RE | Admit: 2018-12-26 | Discharge: 2018-12-26 | Disposition: A | Discharge status: Home / Self Care | Payer: Medicare Other | Source: Ambulatory Visit | Attending: Internal Medicine | Admitting: Internal Medicine

## 2018-12-26 DIAGNOSIS — I712 Thoracic aortic aneurysm, without rupture, unspecified: Secondary | ICD-10-CM

## 2018-12-26 MED ORDER — IOHEXOL 350 MG/ML SOLN
75.0000 mL | Freq: Once | INTRAVENOUS | Status: AC | PRN
  Administered 2018-12-26: 75 mL via INTRAVENOUS

## 2018-12-27 ENCOUNTER — Encounter: Payer: Self-pay | Admitting: Cardiology

## 2018-12-27 NOTE — Progress Notes (Signed)
Remote pacemaker transmission.   

## 2019-01-26 ENCOUNTER — Emergency Department: Payer: Medicare Other

## 2019-01-26 ENCOUNTER — Encounter: Payer: Self-pay | Admitting: Emergency Medicine

## 2019-01-26 ENCOUNTER — Emergency Department
Admission: EM | Admit: 2019-01-26 | Discharge: 2019-01-26 | Disposition: A | Discharge status: Home / Self Care | Payer: Medicare Other | Attending: Student | Admitting: Student

## 2019-01-26 DIAGNOSIS — R1010 Upper abdominal pain, unspecified: Secondary | ICD-10-CM | POA: Insufficient documentation

## 2019-01-26 DIAGNOSIS — R112 Nausea with vomiting, unspecified: Secondary | ICD-10-CM | POA: Insufficient documentation

## 2019-01-26 DIAGNOSIS — Z79899 Other long term (current) drug therapy: Secondary | ICD-10-CM | POA: Diagnosis not present

## 2019-01-26 DIAGNOSIS — J449 Chronic obstructive pulmonary disease, unspecified: Secondary | ICD-10-CM | POA: Diagnosis not present

## 2019-01-26 DIAGNOSIS — Z87891 Personal history of nicotine dependence: Secondary | ICD-10-CM | POA: Diagnosis not present

## 2019-01-26 DIAGNOSIS — Z950 Presence of cardiac pacemaker: Secondary | ICD-10-CM | POA: Diagnosis not present

## 2019-01-26 DIAGNOSIS — Z7901 Long term (current) use of anticoagulants: Secondary | ICD-10-CM | POA: Diagnosis not present

## 2019-01-26 DIAGNOSIS — R197 Diarrhea, unspecified: Secondary | ICD-10-CM | POA: Diagnosis not present

## 2019-01-26 LAB — BASIC METABOLIC PANEL
Anion gap: 9 (ref 5–15)
BUN: 22 mg/dL (ref 8–23)
CO2: 24 mmol/L (ref 22–32)
Calcium: 8.8 mg/dL — ABNORMAL LOW (ref 8.9–10.3)
Chloride: 106 mmol/L (ref 98–111)
Creatinine, Ser: 0.6 mg/dL (ref 0.44–1.00)
GFR calc Af Amer: >60 mL/min (ref >60)
GFR calc non Af Amer: >60 mL/min (ref >60)
Glucose, Bld: 167 mg/dL — ABNORMAL HIGH (ref 70–99)
Potassium: 3.6 mmol/L (ref 3.5–5.1)
Sodium: 139 mmol/L (ref 135–145)

## 2019-01-26 LAB — CBC WITH DIFFERENTIAL/PLATELET
Abs Immature Granulocytes: 0.08 10*3/uL — ABNORMAL HIGH (ref 0.00–0.07)
Basophils Absolute: 0 10*3/uL (ref 0.0–0.1)
Basophils Relative: 0 %
Eosinophils Absolute: 0 10*3/uL (ref 0.0–0.5)
Eosinophils Relative: 0 %
HCT: 42.1 % (ref 36.0–46.0)
Hemoglobin: 13.6 g/dL (ref 12.0–15.0)
Immature Granulocytes: 1 %
Lymphocytes Relative: 7 %
Lymphs Abs: 1 10*3/uL (ref 0.7–4.0)
MCH: 28.7 pg (ref 26.0–34.0)
MCHC: 32.3 g/dL (ref 30.0–36.0)
MCV: 88.8 fL (ref 80.0–100.0)
Monocytes Absolute: 0.7 10*3/uL (ref 0.1–1.0)
Monocytes Relative: 5 %
Neutro Abs: 12.4 10*3/uL — ABNORMAL HIGH (ref 1.7–7.7)
Neutrophils Relative %: 87 %
Platelets: 323 10*3/uL (ref 150–400)
RBC: 4.74 MIL/uL (ref 3.87–5.11)
RDW: 14.6 % (ref 11.5–15.5)
WBC: 14.2 10*3/uL — ABNORMAL HIGH (ref 4.0–10.5)
nRBC: 0 % (ref 0.0–0.2)

## 2019-01-26 LAB — HEPATIC FUNCTION PANEL
ALT: 19 U/L (ref 0–44)
AST: 30 U/L (ref 15–41)
Albumin: 3.6 g/dL (ref 3.5–5.0)
Alkaline Phosphatase: 66 U/L (ref 38–126)
Bilirubin, Direct: <0.1 mg/dL (ref 0.0–0.2)
Total Bilirubin: 0.8 mg/dL (ref 0.3–1.2)
Total Protein: 6.9 g/dL (ref 6.5–8.1)

## 2019-01-26 LAB — LIPASE, BLOOD: Lipase: 27 U/L (ref 11–51)

## 2019-01-26 MED ORDER — IOHEXOL 300 MG/ML  SOLN
100.0000 mL | Freq: Once | INTRAMUSCULAR | Status: AC | PRN
  Administered 2019-01-26: 100 mL via INTRAVENOUS

## 2019-01-26 MED ORDER — SODIUM CHLORIDE 0.9 % IV BOLUS
500.0000 mL | Freq: Once | INTRAVENOUS | Status: AC
  Administered 2019-01-26: 14:00:00 500 mL via INTRAVENOUS

## 2019-01-26 MED ORDER — ONDANSETRON HCL 4 MG/2ML IJ SOLN
4.0000 mg | Freq: Once | INTRAMUSCULAR | Status: AC
  Administered 2019-01-26: 4 mg via INTRAVENOUS
  Filled 2019-01-26: qty 2

## 2019-01-26 MED ORDER — FAMOTIDINE IN NACL 20-0.9 MG/50ML-% IV SOLN
20.0000 mg | Freq: Once | INTRAVENOUS | Status: AC
  Administered 2019-01-26: 20 mg via INTRAVENOUS
  Filled 2019-01-26: qty 50

## 2019-01-26 MED ORDER — ONDANSETRON HCL 4 MG PO TABS: 4.0000 mg | ORAL_TABLET | Freq: Three times a day (TID) | ORAL | 0 refills | Status: AC | PRN

## 2019-01-26 NOTE — ED Notes (Signed)
Pt requesting to be left off of the monitor.

## 2019-01-26 NOTE — Discharge Instructions (Signed)
Thank you for letting us take care of you in the emergency department today.   Please continue to take any regular, prescribed medications.   New medications we have prescribed:  - Zofran - take as needed for nausea  Please follow up with: - Your primary care doctor to review your ER visit and follow up on your symptoms.   Please return to the ER for any new or worsening symptoms.

## 2019-01-26 NOTE — ED Provider Notes (Signed)
[facility] Emergency Department Provider Note ____________________________________________   First MD Initiated Contact with Patient 01/26/19 1225     (approximate)  I have reviewed the triage vital signs and the nursing notes.   HISTORY  Chief Complaint Emesis and Diarrhea    HPI Chelsey Williams is a 85 y.o. female with PMH as noted below who presents with nausea and vomiting, acute onset around 8 AM today, associated with upper abdominal discomfort and some burning going into her chest.  The patient also has had several watery loose bowel movements.  She denies fever or chills.  She has no shortness of breath.  She states she was feeling fine yesterday.  She states she ate soup yesterday but no unusual foods.  She denies any sick contacts.  Past Medical History:  Diagnosis Date  . Anxiety   . Atypical chest pain 11/07/2016  . Bilateral hearing loss   . Coronary artery disease, non-occlusive    a. LHC 11/2014: R dom, LM nl, mLAD 30-40, dLAD minor irregs, LCx minor irregs, RCA minor irregs w/ associated ectasia  . Depression   . GERD (gastroesophageal reflux disease)   . Glaucoma   . Hyperlipemia   . Hypertension   . Macular degeneration   . OA (osteoarthritis)   . Pacemaker    a. Medtronic Adapta ADDRL1 dual-chamber pacemaker (serial number [id number]) with Medtronic 9390 atrial lead (serial number [id number]) and Medtronic 5076 ventricular lead (serial number [id number]), all implanted 12/08/2014 by Dr. Jacklynn Barnacle at [facility] [phone].    Marland Kitchen Palpitations 08/16/2016  . Persistent atrial fibrillation ([place])    a. s/p DCCV 02/2016; b. sotalol and Eliquis; c. CHADS2VASc --> 4 (HTN, age x 2, female)  . Sinus node dysfunction [facility])     Patient Active Problem List   Diagnosis Date Noted  . Acute abdominal pain 06/12/2018  . COPD (chronic obstructive pulmonary disease) ([place]) 10/17/2017  . Irritable bowel syndrome with diarrhea 10/17/2017  . Scalp lesion  10/17/2017  . Vision loss of left eye 08/21/2017  . Labile hypertension 11/07/2016  . Anxiety 11/07/2016  . Coronary artery disease, non-occlusive 08/16/2016  . Episodic atrial fibrillation ([place]) 08/16/2016  . Pacemaker 08/16/2016  . Essential hypertension 08/16/2016  . Dilatation of thoracic aorta ([place]) 08/16/2016  . HLD (hyperlipidemia) 08/16/2016  . Macular degeneration 08/16/2016  . Anticoagulant long-term use 03/24/2016  . AV block 03/24/2016  . Bilateral hearing loss 11/06/2014  . History of thyroid nodule 11/06/2014  . Nonexudative senile macular degeneration of retina 03/31/2013  . Pseudophakia 03/31/2013  . PVD (posterior vitreous detachment), both eyes 03/31/2013  . Generalized osteoarthritis of multiple sites 02/05/2004    Past Surgical History:  Procedure Laterality Date  . CHOLECYSTECTOMY    . knee replacmen    . PACEMAKER IMPLANT  2014  . thyroid nodule      Prior to Admission medications   Medication Sig Start Date End Date Taking? Authorizing Provider  albuterol (PROVENTIL HFA;VENTOLIN HFA) 108 (90 Base) MCG/ACT inhaler Inhale 2 puffs into the lungs every 6 (six) hours as needed for wheezing or shortness of breath. 10/17/17 06/13/19  Glean Hess, MD  apixaban (ELIQUIS) 5 MG TABS tablet Take 1 tablet (5 mg total) by mouth 2 (two) times daily. 07/25/17   Deboraha Sprang, MD  Calcium Carbonate-Vitamin D (CALCIUM-VITAMIN D3 PO) Take 1 each by mouth daily.    [provider]  cholecalciferol (VITAMIN D) 1000 units tablet Take 1,000 Units by mouth daily.  [provider]  cholestyramine (QUESTRAN) 4 g packet Take by mouth 2 (two) times daily.  11/13/16   [provider]  folic acid (FOLVITE) 1 MG tablet Take 1 mg by mouth daily.    [provider]  hydrALAZINE (APRESOLINE) 10 MG tablet Take 20 mg by mouth 3 (three) times daily. 02/22/17   [provider]  HYDROcodone-acetaminophen (NORCO/VICODIN) 5-325 MG tablet take 1  tablet by mouth every 6 hours if needed for pain 10/31/16   [provider]  ipratropium (ATROVENT HFA) 17 MCG/ACT inhaler Inhale 2 puffs into the lungs 4 (four) times daily. 02/11/18 02/11/19  [provider]  Lecithin 1200 MG CAPS Take by mouth.    [provider]  lisinopril (PRINIVIL,ZESTRIL) 40 MG tablet Take 1 tablet (40 mg) by mouth once daily as directed 06/01/18   [provider]  loperamide (IMODIUM) 2 MG capsule Take 2 mg by mouth as needed for diarrhea or loose stools.    [provider]  olopatadine (PATANOL) 0.1 % ophthalmic solution Place 1 drop into the right eye 2 (two) times daily.  11/07/16   [provider]  Omega-3 Fatty Acids (FISH OIL) 1200 MG CAPS Take by mouth.    [provider]  omeprazole (PRILOSEC) 20 MG capsule Take 20 mg by mouth daily as needed.     [provider]  psyllium (METAMUCIL) 58.6 % packet Take 1 packet by mouth daily as needed.    [provider]  sotalol (BETAPACE) 80 MG tablet TAKE 1 TABLET EVERY 12 HOURS 10/24/18   Deboraha Sprang, MD  vitamin B-12 (CYANOCOBALAMIN) 1000 MCG tablet Take 1,000 mcg by mouth daily.    [provider]    Allergies Cephalexin, Diltiazem hcl, Hydrochlorothiazide, Prednisone, Felodipine, Magnesium-containing compounds, Other, Quinolones, Statins, Amlodipine, and Atenolol  Family History  Problem Relation Age of Onset  . Hypertension Mother   . Stroke Mother   . Hypertension Father     Social History Social History   Tobacco Use  . Smoking status: Former Smoker    Quit date: 11/15/1999    Years since quitting: 19.2  . Smokeless tobacco: Never Used  Substance Use Topics  . Alcohol use: No  . Drug use: No    Review of Systems  Constitutional: No fever. Eyes: No redness. ENT: No sore throat. Cardiovascular: Denies chest pain. Respiratory: Denies shortness of breath. Gastrointestinal: Positive for nausea, vomiting, and  diarrhea. Genitourinary: Negative for dysuria.  Musculoskeletal: Negative for back pain. Skin: Negative for rash. Neurological: Negative for headache.   ____________________________________________   PHYSICAL EXAM:  VITAL SIGNS: ED Triage Vitals  Enc Vitals Group     BP 01/26/19 1206 (!) 188/109     Pulse Rate 01/26/19 1206 74     Resp 01/26/19 1206 18     Temp 01/26/19 1206 (!) 97.5 F (36.4 C)     Temp Source 01/26/19 1206 Oral     SpO2 01/26/19 1206 96 %     Weight 01/26/19 1208 159 lb (72.1 kg)     Height 01/26/19 1208 5' 3"  (1.6 m)     Head Circumference --      Peak Flow --      Pain Score 01/26/19 1207 10     Pain Loc --      Pain Edu? --      Excl. in [place]? --     Constitutional: Alert and oriented.  Uncomfortable appearing but in no acute distress. Eyes: Conjunctivae  are normal.  No scleral icterus. Head: Atraumatic. Nose: No congestion/rhinnorhea. Mouth/Throat: Mucous membranes are slightly dry.   Neck: Normal range of motion.  Cardiovascular: Normal rate, regular rhythm. Grossly normal heart sounds.  Good peripheral circulation. Respiratory: Normal respiratory effort.  No retractions. Lungs CTAB. Gastrointestinal: Soft with mild bilateral upper quadrant tenderness.  No distention.  Genitourinary: No flank tenderness. Musculoskeletal: No lower extremity edema.  Extremities warm and well perfused.  Neurologic:  Normal speech and language. No gross focal neurologic deficits are appreciated.  Skin:  Skin is warm and dry. No rash noted. Psychiatric: Mood and affect are normal. Speech and behavior are normal.  ____________________________________________   LABS (all labs ordered are listed, but only abnormal results are displayed)  Labs Reviewed  BASIC METABOLIC PANEL - Abnormal; Notable for the following components:      Result Value   Glucose, Bld 167 (*)    Calcium 8.8 (*)    All other components within normal limits  CBC WITH DIFFERENTIAL/PLATELET -  Abnormal; Notable for the following components:   WBC 14.2 (*)    Neutro Abs 12.4 (*)    Abs Immature Granulocytes 0.08 (*)    All other components within normal limits  HEPATIC FUNCTION PANEL  LIPASE, BLOOD  URINALYSIS, COMPLETE (UACMP) WITH MICROSCOPIC   ____________________________________________  EKG  ED ECG REPORT I, Arta Silence, the attending physician, personally viewed and interpreted this ECG.  Date: 01/26/2019 EKG Time: 1347 Rate: 74 Rhythm: normal sinus rhythm QRS Axis: normal Intervals: Borderline prolonged QTc ST/T Wave abnormalities: normal Narrative Interpretation: no evidence of acute ischemia  ____________________________________________  RADIOLOGY  CT abdomen: Pending ____________________________________________   PROCEDURES  Procedure(s) performed: No  Procedures  Critical Care performed: No ____________________________________________   INITIAL IMPRESSION / ASSESSMENT AND PLAN / ED COURSE  Pertinent labs & imaging results that were available during my care of the patient were reviewed by me and considered in my medical decision making (see chart for details).  85 year old female with PMH as noted above presents with acute onset of nausea, vomiting, and diarrhea as well as some upper abdominal pain and some burning going up into her chest and throat.  She states she ate some soup last night but denies any unusual foods and has no sick contacts with similar GI symptoms.  I reviewed the past medical records in [place].  The patient was most recent admitted in February of this year with enteritis and intractable nausea and vomiting.  On exam she is uncomfortable appearing and intermittently retching.  However, she does not appear to be vomiting a large amount.  The abdomen is soft with bilateral upper quadrant tenderness.  Her vital signs are normal except for hypertension.  Overall I suspect most likely gastroenteritis versus foodborne  illness although differential also includes gastritis, colitis, diverticulitis, or less likely hepatobiliary etiology.  The patient's EKG shows no ischemic findings and I do not suspect cardiac cause.  We will obtain lab work-up, give Zofran and Pepcid, fluids, and reassess.  I will consider imaging based on the patient's clinical response and the results of the work-up.  ----------------------------------------- 3:27 PM on 01/26/2019 -----------------------------------------  Lab work-up is unremarkable except for elevated WBC count.  The patient states she feels significantly better after Zofran, Pepcid, and fluids.  She appears much more comfortable and is no longer vomiting or retching.  Based on the elevated RBC count and shared decision making with the patient and her son, we will obtain a CT to further evaluate.  The patient states that she potentially feels well enough to go home.  If the CT is negative and the patient continues to appear well and is tolerating p.o., anticipate possible discharge.  I have signed the patient out to the oncoming physician Dr. Joan Mayans.  ____________________________________________   FINAL CLINICAL IMPRESSION(S) / ED DIAGNOSES  Final diagnoses:  Nausea vomiting and diarrhea      NEW MEDICATIONS STARTED DURING THIS VISIT:  New Prescriptions   No medications on file     Note:  This document was prepared using Dragon voice recognition software and may include unintentional dictation errors.    Arta Silence, MD 01/26/19 [phone]

## 2019-01-26 NOTE — ED Notes (Signed)
Pt given water and saltine crackers for PO challenge.

## 2019-01-26 NOTE — ED Notes (Signed)
Pt verbalized understanding of discharge instructions. NAD at this time. 

## 2019-01-26 NOTE — ED Provider Notes (Addendum)
CT read: IMPRESSION: 1. A few mildly prominent jejunal loops over the left mid to upper abdomen with possible mild associated wall thickening as well as wall thickening over a small bowel loop in the right lower abdomen. Findings may be due to regional enteritis of infectious or inflammatory nature. 2. Diverticulosis of the colon without active inflammation. 3. Few small bilateral renal cysts. Indeterminate subcentimeter except hypodensity over the upper pole left kidney too small to characterize but likely a hyperdense cyst. Recommend follow-up CT with and without 6 months. 4. Aortic Atherosclerosis (ICD10-I70.0).  Updated patient on results. She has tolerated PO here without issues and is feeling improved, asking for discharge. As such, will advise supportive care and close PCP follow up. Given Rx for Zofran as needed. Given return precautions.      Lilia Pro., MD 01/27/19 [phone]

## 2019-01-26 NOTE — ED Triage Notes (Signed)
Pt to ED by EMS with c/o of NVD that started this morning. Pt also has rash to torso, arms and head. Pt states she "itches all the time".

## 2019-05-07 ENCOUNTER — Ambulatory Visit (INDEPENDENT_AMBULATORY_CARE_PROVIDER_SITE_OTHER): Payer: Medicare Other | Admitting: *Deleted

## 2019-05-07 DIAGNOSIS — I495 Sick sinus syndrome: Secondary | ICD-10-CM

## 2019-05-07 LAB — CUP PACEART REMOTE DEVICE CHECK
Battery Impedance: 256 Ohm
Battery Remaining Longevity: 108 mo
Battery Voltage: 2.79 V
Brady Statistic AP VP Percent: 0 %
Brady Statistic AP VS Percent: 96 %
Brady Statistic AS VP Percent: 0 %
Brady Statistic AS VS Percent: 4 %
Date Time Interrogation Session: 20210112233508
Implantable Lead Implant Date: 20160816
Implantable Lead Implant Date: 20160816
Implantable Lead Location: 753859
Implantable Lead Location: 753860
Implantable Lead Model: 4574
Implantable Lead Model: 5076
Implantable Pulse Generator Implant Date: 20160816
Lead Channel Impedance Value: 501 Ohm
Lead Channel Impedance Value: 576 Ohm
Lead Channel Pacing Threshold Amplitude: 0.375 V
Lead Channel Pacing Threshold Amplitude: 0.75 V
Lead Channel Pacing Threshold Pulse Width: 0.4 ms
Lead Channel Pacing Threshold Pulse Width: 0.4 ms
Lead Channel Setting Pacing Amplitude: 2 V
Lead Channel Setting Pacing Amplitude: 2.5 V
Lead Channel Setting Pacing Pulse Width: 0.4 ms
Lead Channel Setting Sensing Sensitivity: 5.6 mV

## 2019-07-21 ENCOUNTER — Other Ambulatory Visit: Payer: Self-pay | Admitting: Internal Medicine

## 2019-08-06 ENCOUNTER — Ambulatory Visit (INDEPENDENT_AMBULATORY_CARE_PROVIDER_SITE_OTHER): Payer: Medicare Other | Admitting: *Deleted

## 2019-08-06 DIAGNOSIS — Z950 Presence of cardiac pacemaker: Secondary | ICD-10-CM | POA: Diagnosis not present

## 2019-08-08 LAB — CUP PACEART REMOTE DEVICE CHECK
Battery Impedance: 304 Ohm
Battery Remaining Longevity: 103 mo
Battery Voltage: 2.79 V
Brady Statistic AP VP Percent: 0 %
Brady Statistic AP VS Percent: 95 %
Brady Statistic AS VP Percent: 0 %
Brady Statistic AS VS Percent: 5 %
Date Time Interrogation Session: 20210415223612
Implantable Lead Implant Date: 20160816
Implantable Lead Implant Date: 20160816
Implantable Lead Location: 753859
Implantable Lead Location: 753860
Implantable Lead Model: 4574
Implantable Lead Model: 5076
Implantable Pulse Generator Implant Date: 20160816
Lead Channel Impedance Value: 509 Ohm
Lead Channel Impedance Value: 615 Ohm
Lead Channel Pacing Threshold Amplitude: 0.25 V
Lead Channel Pacing Threshold Amplitude: 0.625 V
Lead Channel Pacing Threshold Pulse Width: 0.4 ms
Lead Channel Pacing Threshold Pulse Width: 0.4 ms
Lead Channel Setting Pacing Amplitude: 2 V
Lead Channel Setting Pacing Amplitude: 2.5 V
Lead Channel Setting Pacing Pulse Width: 0.4 ms
Lead Channel Setting Sensing Sensitivity: 5.6 mV

## 2019-08-08 NOTE — Progress Notes (Signed)
PPM Remote  

## 2019-11-26 ENCOUNTER — Telehealth: Payer: Self-pay | Admitting: Internal Medicine

## 2019-11-26 ENCOUNTER — Other Ambulatory Visit: Payer: Self-pay

## 2019-11-26 MED ORDER — SOTALOL HCL 80 MG PO TABS: 80.0000 mg | ORAL_TABLET | Freq: Two times a day (BID) | ORAL | 0 refills | Status: DC

## 2019-11-26 NOTE — Telephone Encounter (Signed)
Patient needs an appt.  Send message to scheduling.  Sent in a 30 day supply.    sotalol (BETAPACE) 80 MG tablet 60 tablet 0 11/26/2019    Sig - Route: Take 1 tablet (80 mg total) by mouth every 12 (twelve) hours. - Oral   Sent to pharmacy as: sotalol (BETAPACE) 80 MG tablet   Notes to Pharmacy: Pt needs to contact office to schedule future appointment and refills, Thank you. [phone]   E-Prescribing Status: Sent to pharmacy (11/26/2019  4:06 PM EDT)   Pharmacy  [id number] - [place]

## 2019-11-26 NOTE — Telephone Encounter (Signed)
*  STAT* If patient is at the pharmacy, call can be transferred to refill team.   1. Which medications need to be refilled? (please list name of each medication and dose if known) sotalol 80 MG   2. Which pharmacy/location (including street and city if local pharmacy) is medication to be sent to? [facility] on [address].   3. Do they need a 30 day or 90 day supply? 90 day   Needs sent in ASAP

## 2019-12-31 ENCOUNTER — Telehealth: Payer: Self-pay | Admitting: Internal Medicine

## 2019-12-31 MED ORDER — SOTALOL HCL 80 MG PO TABS: 80.0000 mg | ORAL_TABLET | Freq: Two times a day (BID) | ORAL | 0 refills | Status: DC

## 2019-12-31 NOTE — Telephone Encounter (Signed)
Patient's son called requesting a refill of sotalol stating that she only has one days left worth of medication. On review of the chart, it appears the patient has not been seen in a year and had a 30 day supply called in on 11/26/19. Discussed the importance of being seen and patient's son stated they have an appointment in December which is the earliest they could be seen. Sent in a 90 day supply of her current dose of sotalol and stressed that it is not appropriate for her to get more refills beyond this until she is seen. Also discussed the importance of calling during day time hours several days before she runs out to ensure theres no gap in dosing. Will forward this note to Dr. Caryl Comes.   Alric Quan, MD Cardiology [facility]

## 2020-01-01 ENCOUNTER — Telehealth: Payer: Self-pay | Admitting: Internal Medicine

## 2020-01-01 NOTE — Telephone Encounter (Signed)
Patient son calling - very upset we have not sent in refill, patient only has 2 pills left - wants ASAP - appt scheduled in [place] with Dr Caryl Comes on 03/26/20 - if unable to fill please call patient to explain   *STAT* If patient is at the pharmacy, call can be transferred to refill team.   1. Which medications need to be refilled? (please list name of each medication and dose if known) sotalol 80 MG  2. Which pharmacy/location (including street and city if local pharmacy) is medication to be sent to? [facility]   3. Do they need a 30 day or 90 day supply? 90 day

## 2020-01-01 NOTE — Telephone Encounter (Signed)
Spoke with patient's son Aaron Edelman to inform him that the prescription was sent to [facility] yesterday for a 90 day supply. He states he is aware of this because he got a message in her [facility] My Chart.

## 2020-03-26 ENCOUNTER — Encounter: Payer: Medicare Other | Admitting: Internal Medicine

## 2020-08-30 IMAGING — CR DG SHOULDER 2+V*R*
2 series · 2 of 2 positions shown · non-contrast
Comparison: None.

CLINICAL DATA: Fall, right shoulder pain

EXAM:
RIGHT SHOULDER - 2+ VIEW

[shoulder grashey]
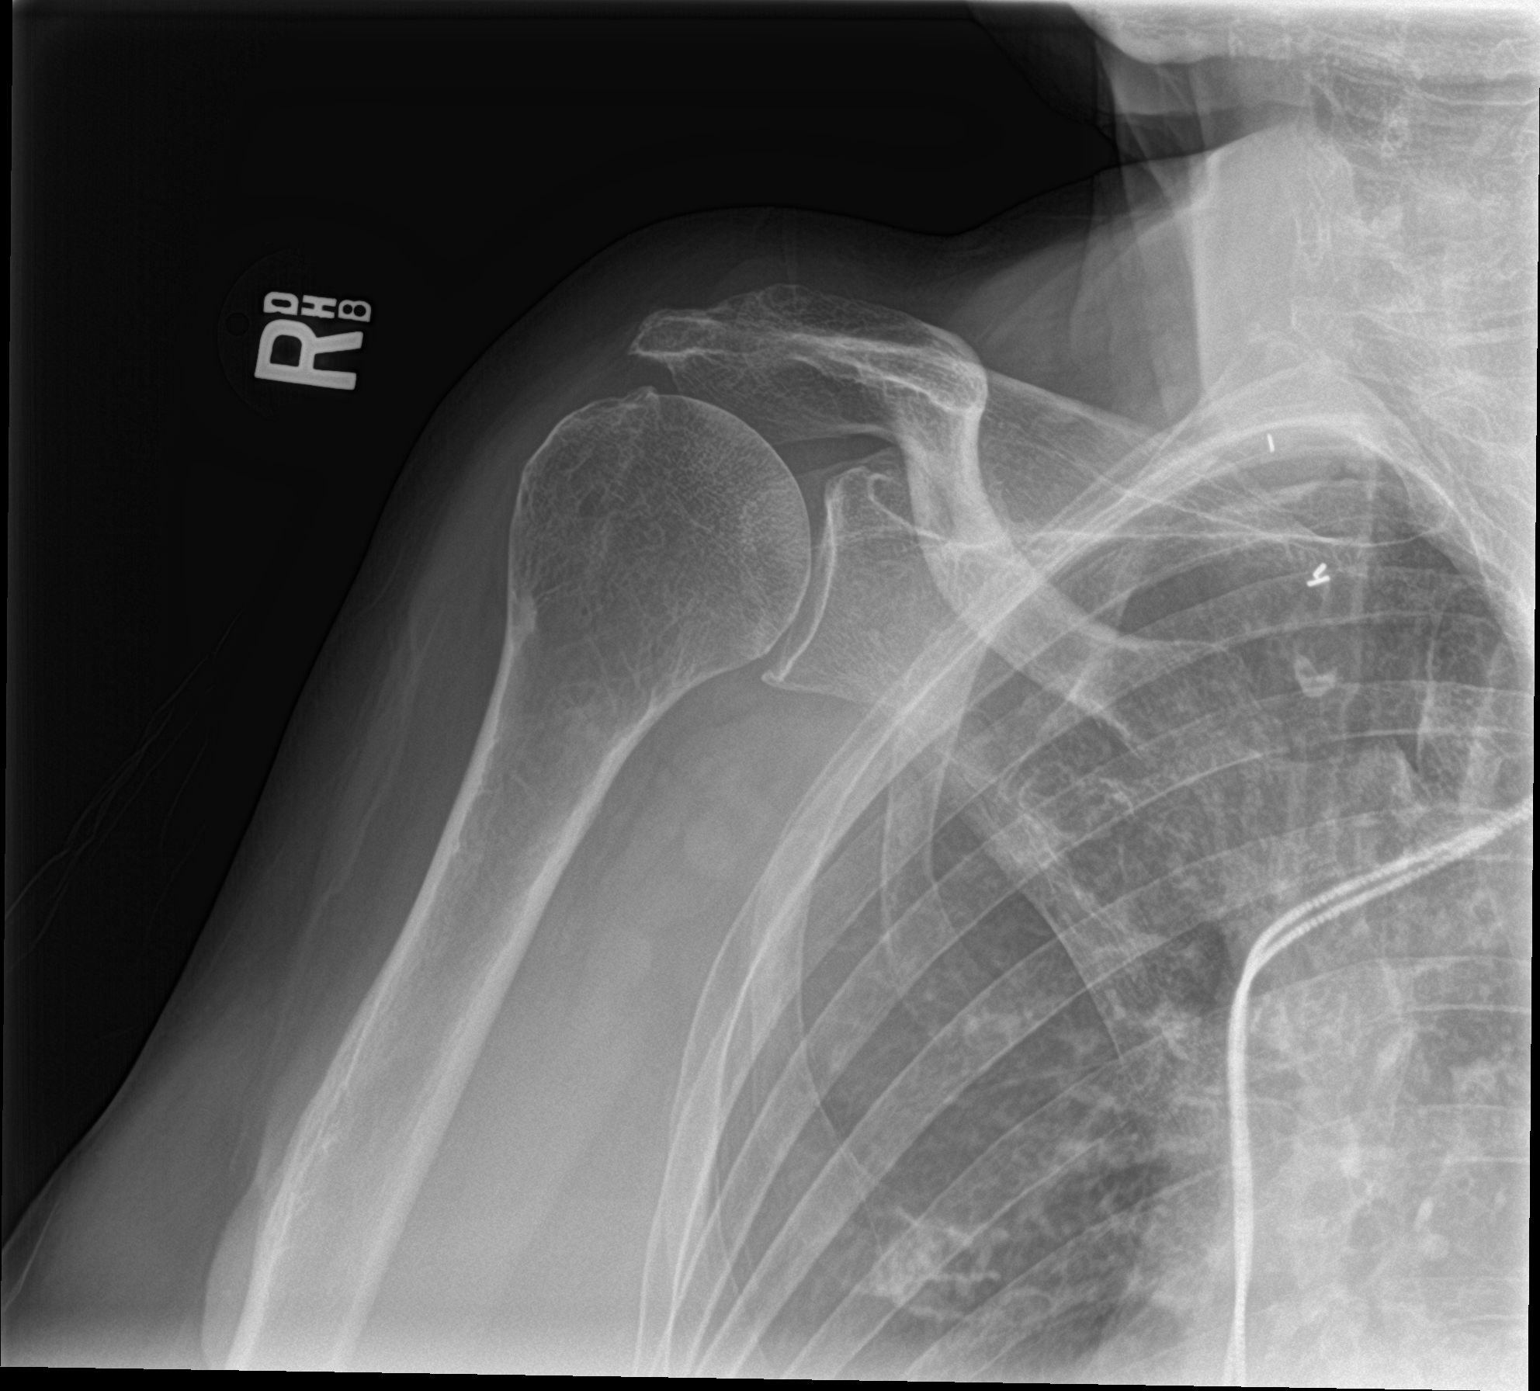

[shoulder y view]
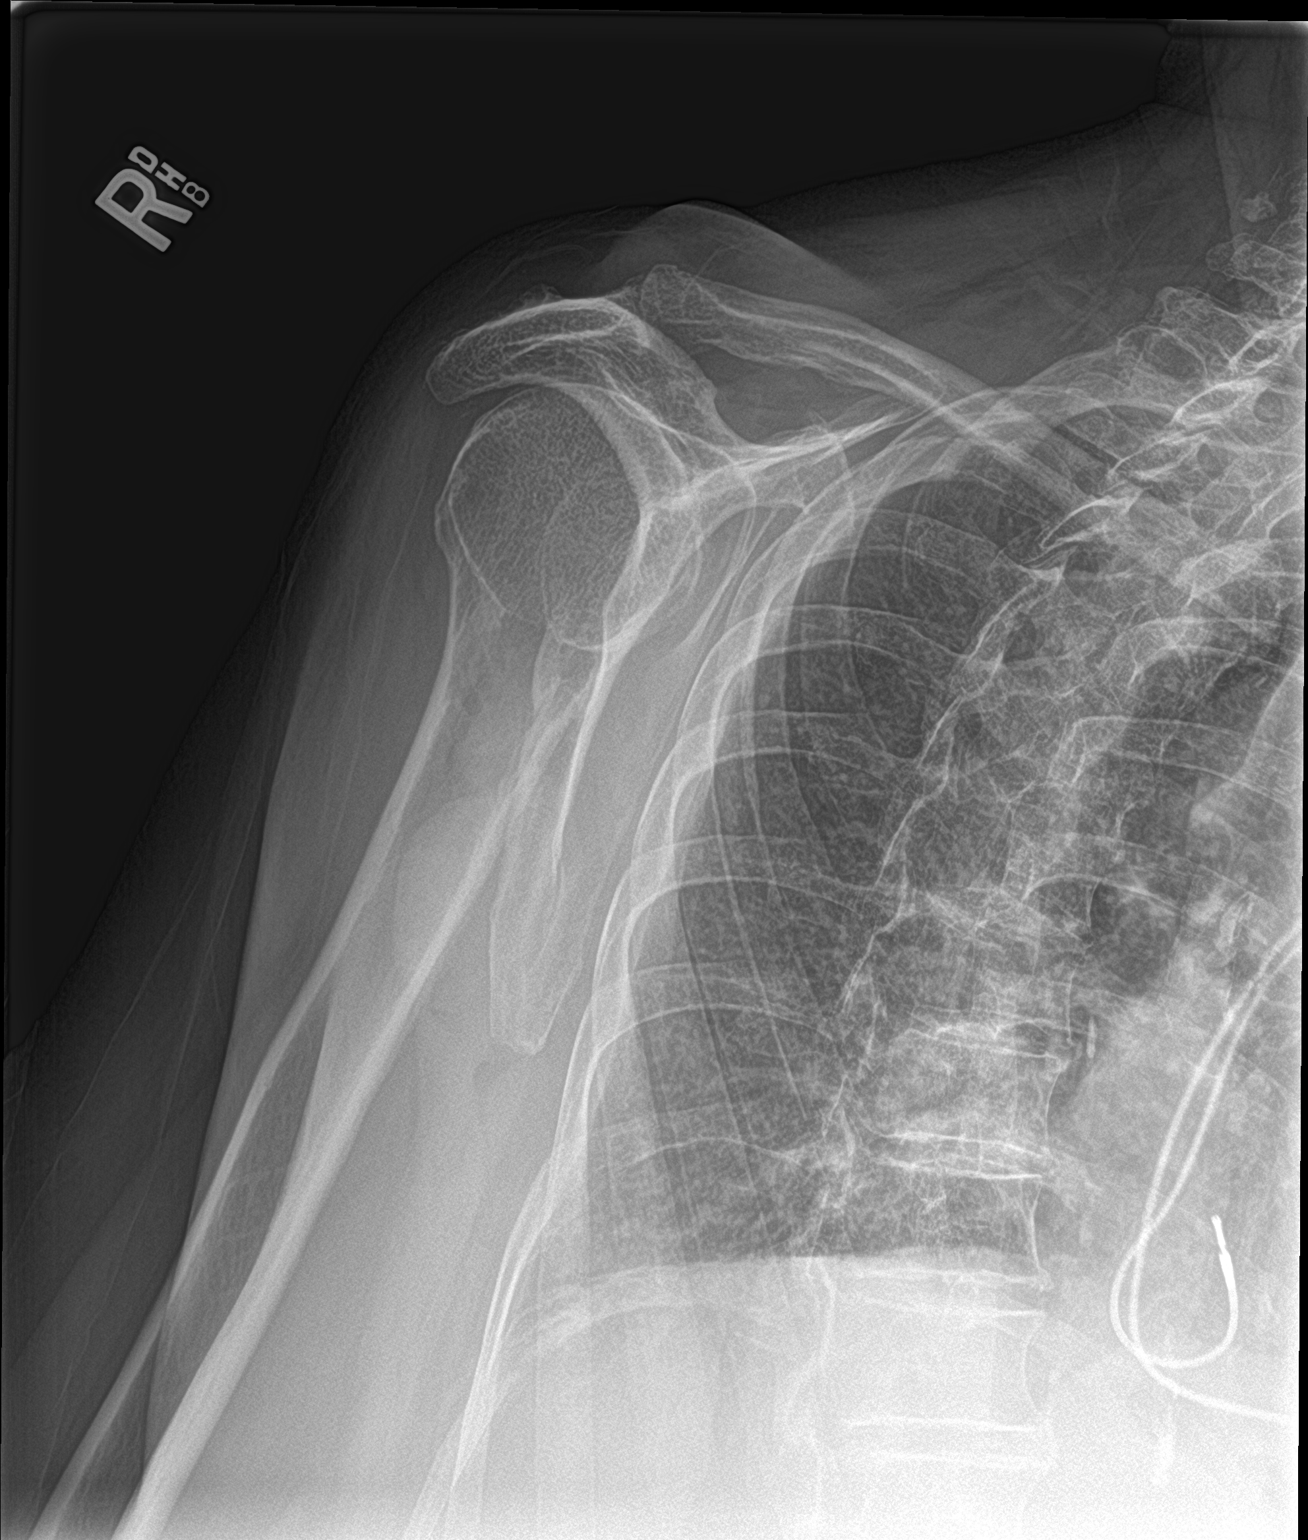

[2 of 2 positions shown; findings below may reference images not displayed]

FINDINGS: No fracture or dislocation is seen.

Mild degenerative changes of the glenohumeral joint.

Visualized soft tissues are within normal limits.

Visualized right lung is clear.
IMPRESSION: Negative.

## 2020-08-30 IMAGING — CR DG RIBS W/ CHEST 3+V*R*
4 series · 4 of 4 positions shown · non-contrast
Comparison: CTA chest dated 11/06/2016

CLINICAL DATA: Fall, right rib pain

EXAM:
RIGHT RIBS AND CHEST - 3+ VIEW

[chest pa]
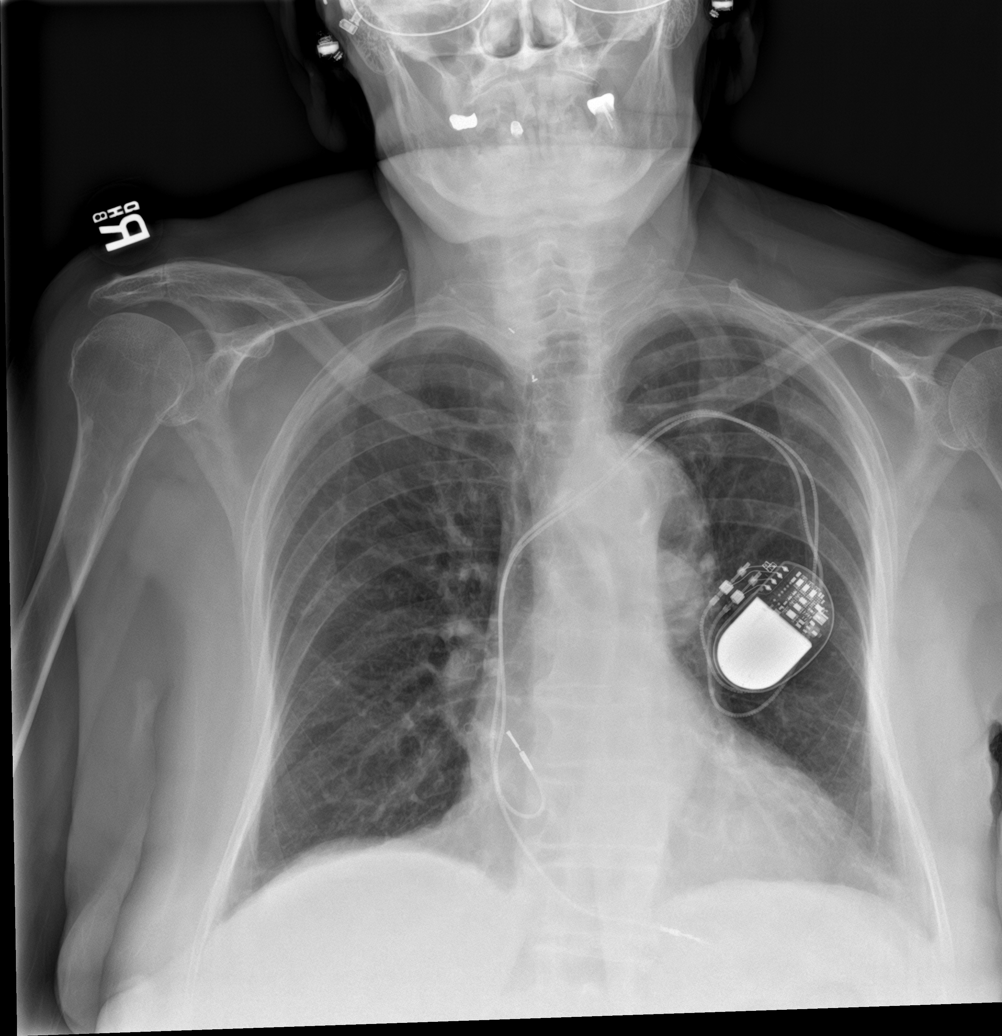

[rib pa]
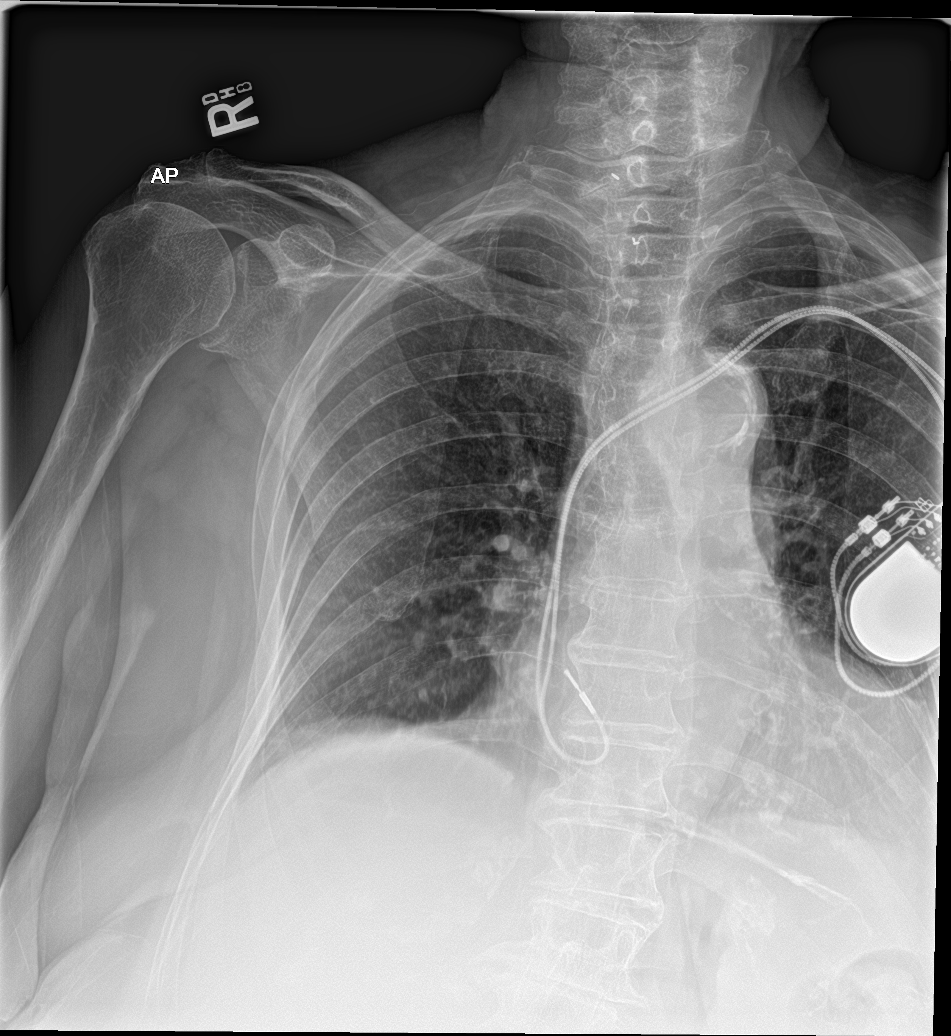

[rib pa obl]
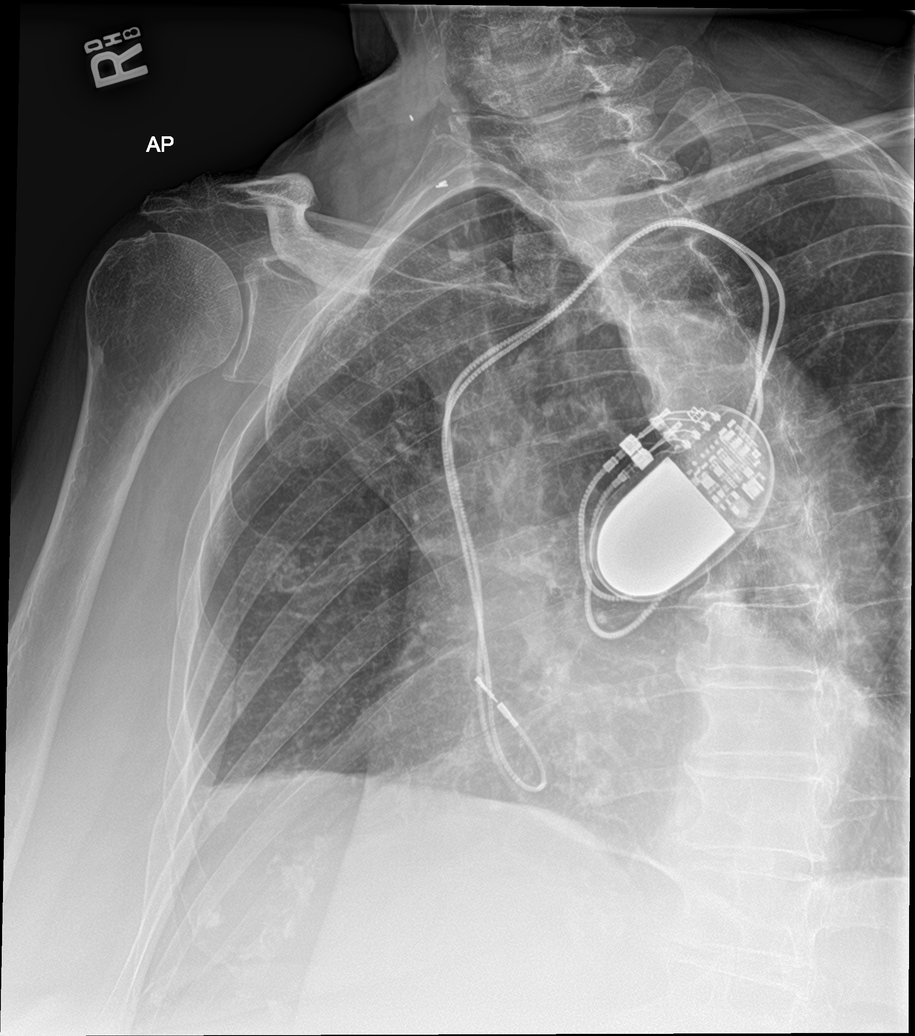

[rib ap]
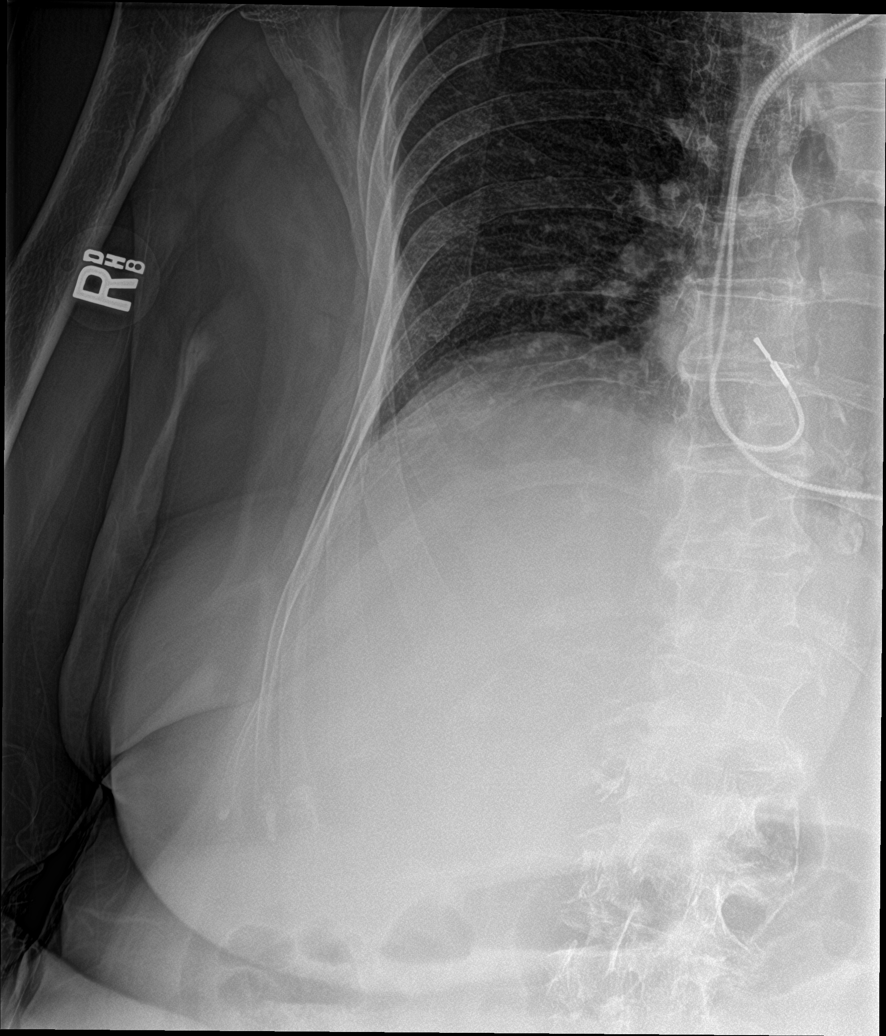

[4 of 4 positions shown; findings below may reference images not displayed]

FINDINGS: Lungs are clear.  No pleural effusion or pneumothorax.

The heart is normal in size.  Left subclavian pacemaker.

No displaced right rib fracture is seen.
IMPRESSION: No evidence of acute cardiopulmonary disease.

No displaced right rib fracture is seen.

## 2020-08-30 IMAGING — CT CT HEAD W/O CM
3 series · 15 of 47 positions shown, 18 images · non-contrast
Comparison: 08/15/2016

CLINICAL DATA: Fall

EXAM:
CT HEAD WITHOUT CONTRAST
TECHNIQUE: Contiguous axial images were obtained from the base of the skull
through the vertex without intravenous contrast.

[Series 2: head wo · axial · 0.41mm/px · z∈[-178,-53]mm · 9 of 30 slices shown, 12 images]
[im 3/30  brain]
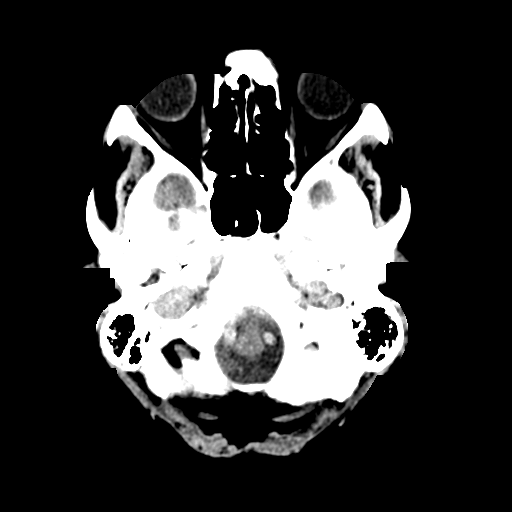
[im 3/30  bone]
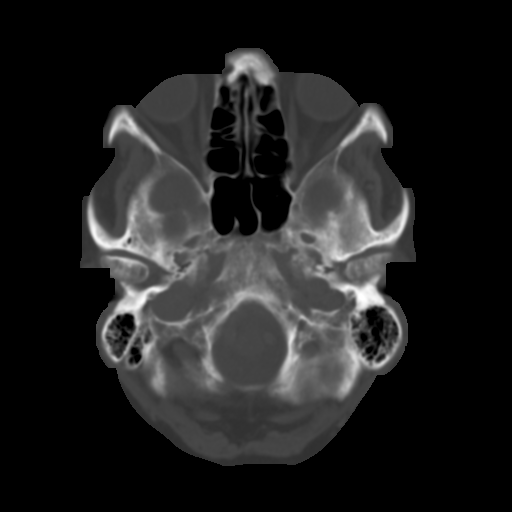
[im 6/30  brain]
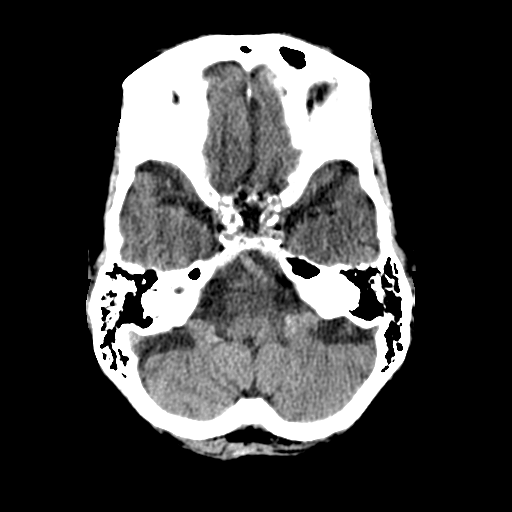
[im 9/30  brain]
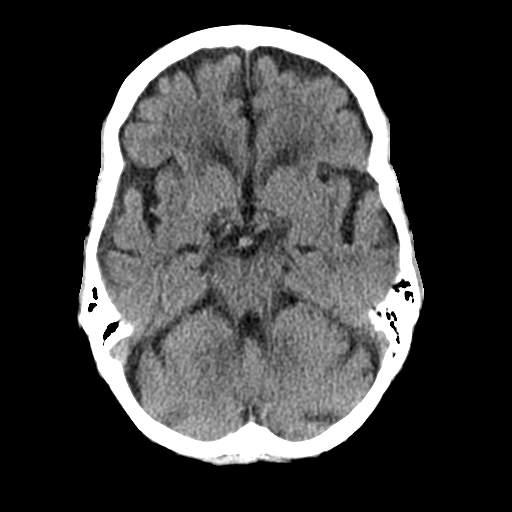
[im 12/30  brain]
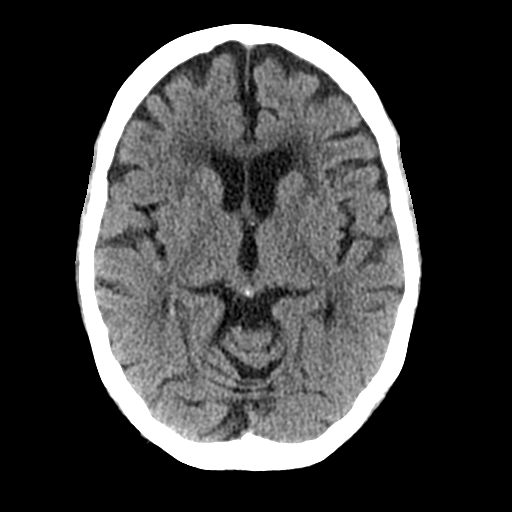
[im 16/30  brain]
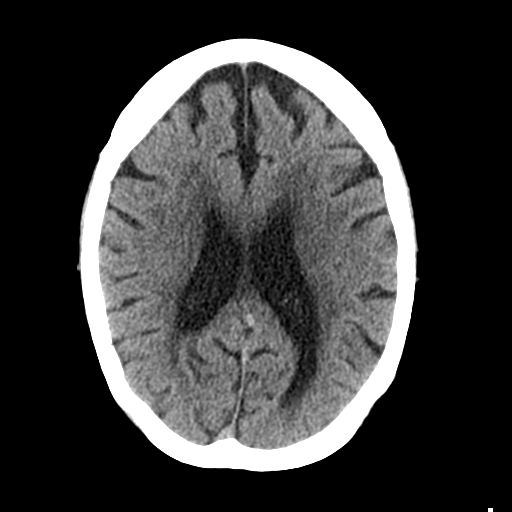
[im 16/30  bone]
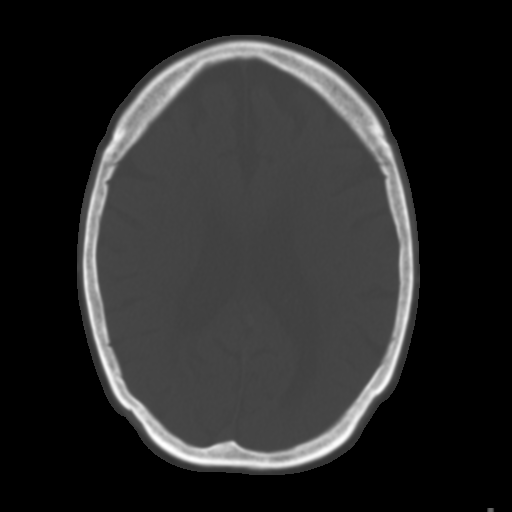
[im 19/30  brain]
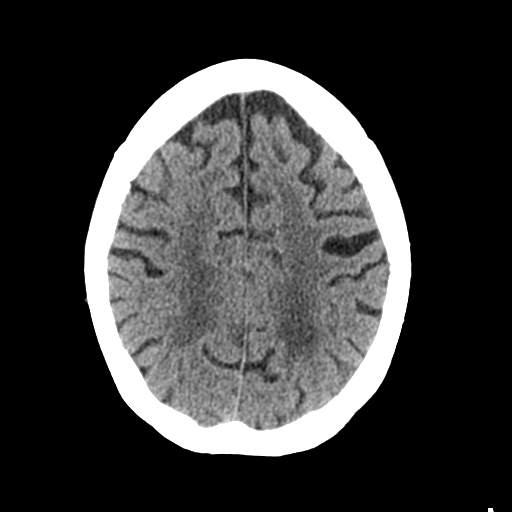
[im 22/30  brain]
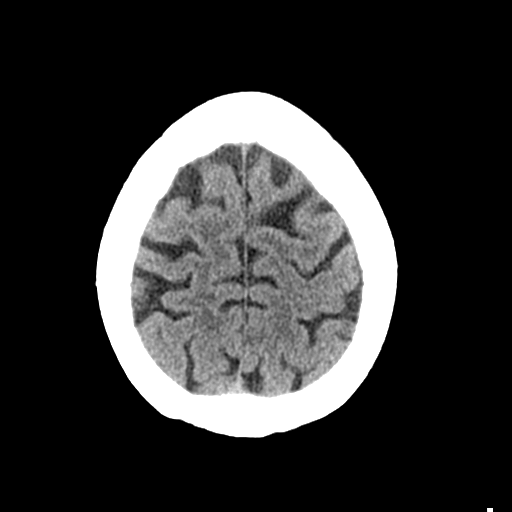
[im 25/30  brain]
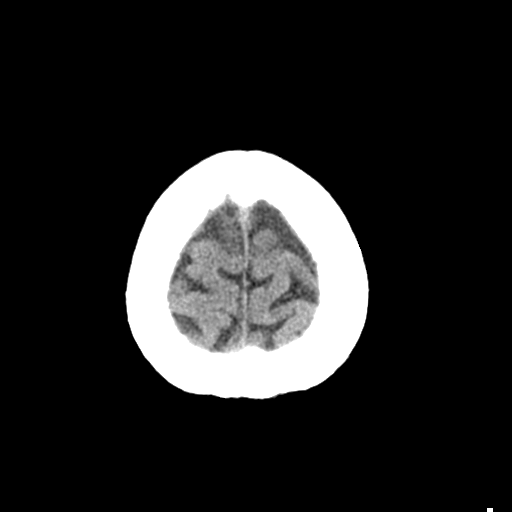
[im 28/30  brain]
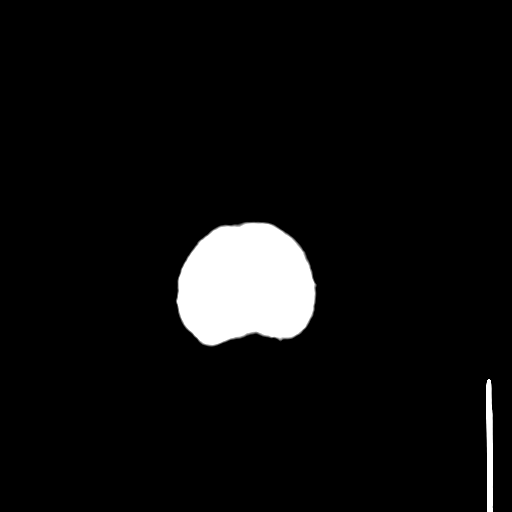
[im 28/30  bone]
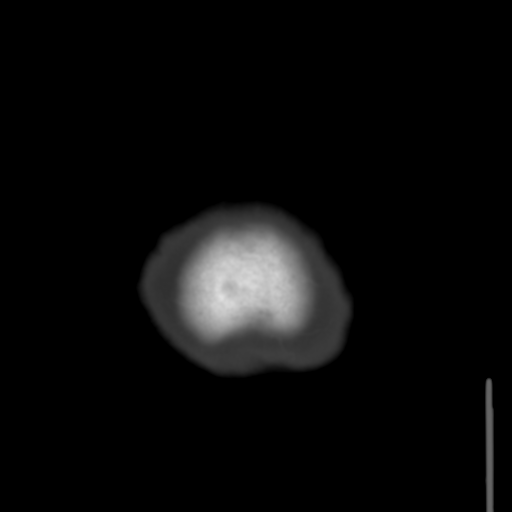

[Series 4: coronal soft tissue · coronal · 0.30mm/px · 3 of 64 slices shown]
[im 22/64  brain]
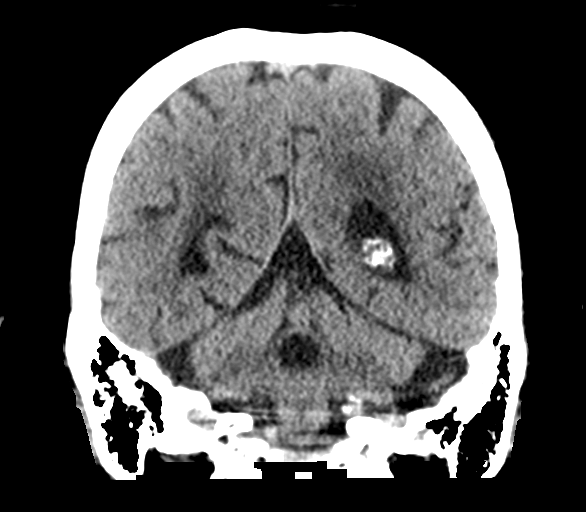
[im 29/64  brain]
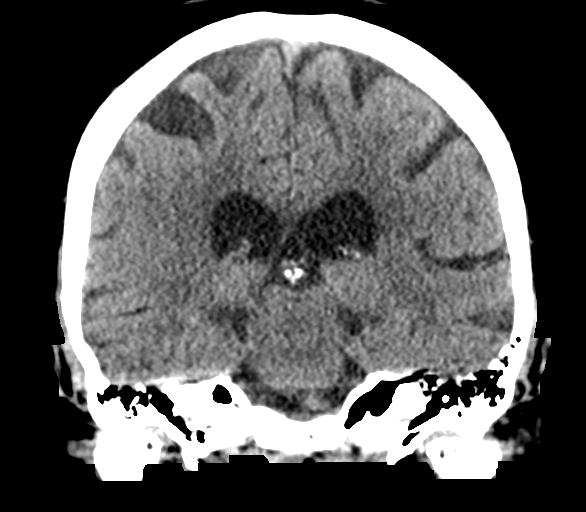
[im 36/64  brain]
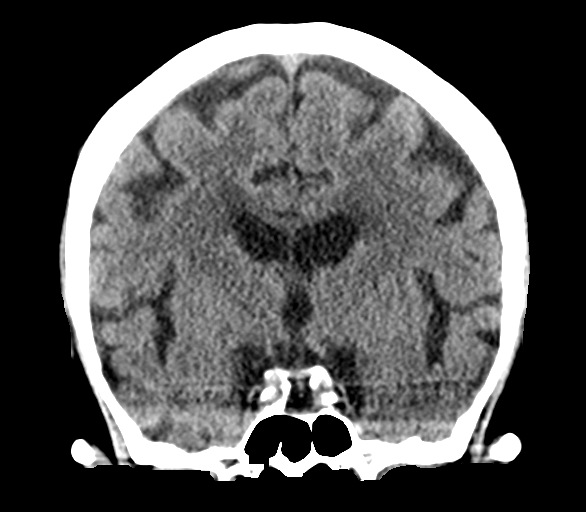

[Series 5: sagittal soft tissue · sagittal · 0.30mm/px · 3 of 51 slices shown]
[im 17/51  brain]
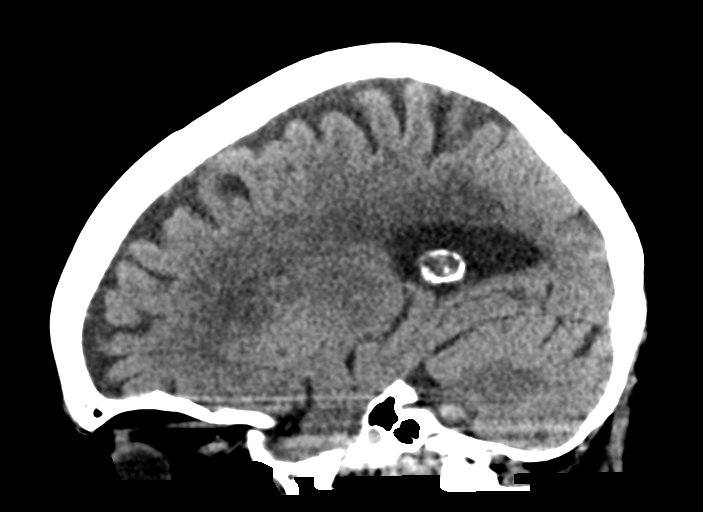
[im 26/51  brain]
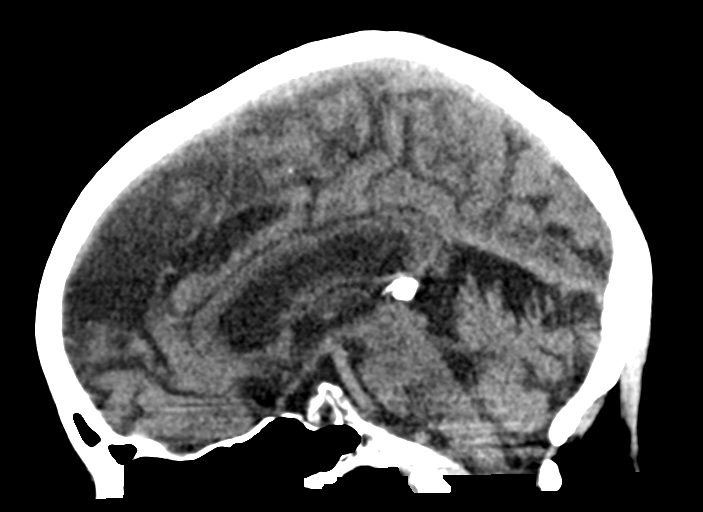
[im 34/51  brain]
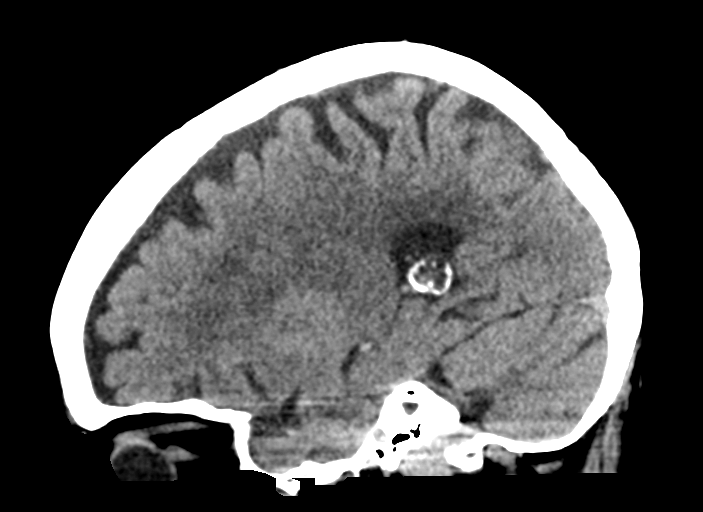

[15 of 47 positions shown; findings below may reference images not displayed]

FINDINGS: Brain: No evidence of acute infarction, hemorrhage, hydrocephalus,
extra-axial collection or mass lesion/mass effect.

Mild cortical atrophy.

Subcortical white matter and periventricular small vessel ischemic
changes.

Vascular: Intracranial atherosclerosis.

Skull: Normal. Negative for fracture or focal lesion.

Sinuses/Orbits: The visualized paranasal sinuses are essentially
clear. The mastoid air cells are unopacified.

Other: None.
IMPRESSION: No evidence of acute intracranial abnormality.

Atrophy with small vessel ischemic changes.

## 2021-03-31 ENCOUNTER — Inpatient Hospital Stay
Admission: EM | Admit: 2021-03-31 | Discharge: 2021-04-04 | DRG: 386 | Disposition: A | Discharge status: Home / Self Care | Payer: Medicare Other | Attending: Obstetrics and Gynecology | Admitting: Obstetrics and Gynecology

## 2021-03-31 ENCOUNTER — Encounter: Payer: Self-pay | Admitting: Emergency Medicine

## 2021-03-31 ENCOUNTER — Emergency Department: Payer: Medicare Other

## 2021-03-31 ENCOUNTER — Other Ambulatory Visit: Payer: Self-pay

## 2021-03-31 DIAGNOSIS — K449 Diaphragmatic hernia without obstruction or gangrene: Secondary | ICD-10-CM | POA: Diagnosis present

## 2021-03-31 DIAGNOSIS — H3530 Unspecified macular degeneration: Secondary | ICD-10-CM | POA: Diagnosis present

## 2021-03-31 DIAGNOSIS — Z881 Allergy status to other antibiotic agents status: Secondary | ICD-10-CM

## 2021-03-31 DIAGNOSIS — E785 Hyperlipidemia, unspecified: Secondary | ICD-10-CM | POA: Diagnosis present

## 2021-03-31 DIAGNOSIS — R443 Hallucinations, unspecified: Secondary | ICD-10-CM | POA: Diagnosis not present

## 2021-03-31 DIAGNOSIS — K56600 Partial intestinal obstruction, unspecified as to cause: Secondary | ICD-10-CM | POA: Diagnosis present

## 2021-03-31 DIAGNOSIS — Z8249 Family history of ischemic heart disease and other diseases of the circulatory system: Secondary | ICD-10-CM | POA: Diagnosis not present

## 2021-03-31 DIAGNOSIS — Z7901 Long term (current) use of anticoagulants: Secondary | ICD-10-CM

## 2021-03-31 DIAGNOSIS — I480 Paroxysmal atrial fibrillation: Secondary | ICD-10-CM | POA: Diagnosis present

## 2021-03-31 DIAGNOSIS — T380X5A Adverse effect of glucocorticoids and synthetic analogues, initial encounter: Secondary | ICD-10-CM | POA: Diagnosis not present

## 2021-03-31 DIAGNOSIS — Z79899 Other long term (current) drug therapy: Secondary | ICD-10-CM

## 2021-03-31 DIAGNOSIS — Z20822 Contact with and (suspected) exposure to covid-19: Secondary | ICD-10-CM | POA: Diagnosis present

## 2021-03-31 DIAGNOSIS — I495 Sick sinus syndrome: Secondary | ICD-10-CM | POA: Diagnosis present

## 2021-03-31 DIAGNOSIS — K50118 Crohn's disease of large intestine with other complication: Secondary | ICD-10-CM

## 2021-03-31 DIAGNOSIS — I2510 Atherosclerotic heart disease of native coronary artery without angina pectoris: Secondary | ICD-10-CM | POA: Diagnosis present

## 2021-03-31 DIAGNOSIS — I4819 Other persistent atrial fibrillation: Secondary | ICD-10-CM | POA: Diagnosis present

## 2021-03-31 DIAGNOSIS — K5000 Crohn's disease of small intestine without complications: Secondary | ICD-10-CM | POA: Diagnosis not present

## 2021-03-31 DIAGNOSIS — J449 Chronic obstructive pulmonary disease, unspecified: Secondary | ICD-10-CM | POA: Diagnosis present

## 2021-03-31 DIAGNOSIS — K5090 Crohn's disease, unspecified, without complications: Secondary | ICD-10-CM

## 2021-03-31 DIAGNOSIS — H9193 Unspecified hearing loss, bilateral: Secondary | ICD-10-CM | POA: Diagnosis present

## 2021-03-31 DIAGNOSIS — Z950 Presence of cardiac pacemaker: Secondary | ICD-10-CM | POA: Diagnosis not present

## 2021-03-31 DIAGNOSIS — K50012 Crohn's disease of small intestine with intestinal obstruction: Secondary | ICD-10-CM | POA: Diagnosis not present

## 2021-03-31 DIAGNOSIS — Z87891 Personal history of nicotine dependence: Secondary | ICD-10-CM | POA: Diagnosis not present

## 2021-03-31 DIAGNOSIS — I10 Essential (primary) hypertension: Secondary | ICD-10-CM | POA: Diagnosis present

## 2021-03-31 DIAGNOSIS — Z888 Allergy status to other drugs, medicaments and biological substances status: Secondary | ICD-10-CM | POA: Diagnosis not present

## 2021-03-31 DIAGNOSIS — H409 Unspecified glaucoma: Secondary | ICD-10-CM | POA: Diagnosis present

## 2021-03-31 DIAGNOSIS — K56609 Unspecified intestinal obstruction, unspecified as to partial versus complete obstruction: Secondary | ICD-10-CM | POA: Diagnosis present

## 2021-03-31 DIAGNOSIS — K50812 Crohn's disease of both small and large intestine with intestinal obstruction: Principal | ICD-10-CM | POA: Diagnosis present

## 2021-03-31 DIAGNOSIS — F09 Unspecified mental disorder due to known physiological condition: Secondary | ICD-10-CM | POA: Diagnosis not present

## 2021-03-31 DIAGNOSIS — E538 Deficiency of other specified B group vitamins: Secondary | ICD-10-CM | POA: Diagnosis present

## 2021-03-31 DIAGNOSIS — K219 Gastro-esophageal reflux disease without esophagitis: Secondary | ICD-10-CM | POA: Diagnosis present

## 2021-03-31 LAB — CBC
HCT: 42.6 % (ref 36.0–46.0)
Hemoglobin: 14 g/dL (ref 12.0–15.0)
MCH: 29.4 pg (ref 26.0–34.0)
MCHC: 32.9 g/dL (ref 30.0–36.0)
MCV: 89.5 fL (ref 80.0–100.0)
Platelets: 320 10*3/uL (ref 150–400)
RBC: 4.76 MIL/uL (ref 3.87–5.11)
RDW: 14 % (ref 11.5–15.5)
WBC: 14.6 10*3/uL — ABNORMAL HIGH (ref 4.0–10.5)
nRBC: 0 % (ref 0.0–0.2)

## 2021-03-31 LAB — COMPREHENSIVE METABOLIC PANEL
ALT: 17 U/L (ref 0–44)
AST: 31 U/L (ref 15–41)
Albumin: 3.8 g/dL (ref 3.5–5.0)
Alkaline Phosphatase: 64 U/L (ref 38–126)
Anion gap: 8 (ref 5–15)
BUN: 20 mg/dL (ref 8–23)
CO2: 26 mmol/L (ref 22–32)
Calcium: 9.1 mg/dL (ref 8.9–10.3)
Chloride: 100 mmol/L (ref 98–111)
Creatinine, Ser: 0.54 mg/dL (ref 0.44–1.00)
GFR, Estimated: >60 mL/min (ref >60)
Glucose, Bld: 169 mg/dL — ABNORMAL HIGH (ref 70–99)
Potassium: 3.7 mmol/L (ref 3.5–5.1)
Sodium: 134 mmol/L — ABNORMAL LOW (ref 135–145)
Total Bilirubin: 0.6 mg/dL (ref 0.3–1.2)
Total Protein: 7.2 g/dL (ref 6.5–8.1)

## 2021-03-31 LAB — RESP PANEL BY RT-PCR (FLU A&B, COVID) ARPGX2
Influenza A by PCR: NEGATIVE
Influenza B by PCR: NEGATIVE
SARS Coronavirus 2 by RT PCR: NEGATIVE

## 2021-03-31 LAB — MAGNESIUM: Magnesium: 2 mg/dL (ref 1.7–2.4)

## 2021-03-31 LAB — LIPASE, BLOOD: Lipase: 35 U/L (ref 11–51)

## 2021-03-31 MED ORDER — ONDANSETRON HCL 4 MG/2ML IJ SOLN: 4.0000 mg | Freq: Four times a day (QID) | INTRAMUSCULAR | Status: DC | PRN

## 2021-03-31 MED ORDER — MORPHINE SULFATE (PF) 2 MG/ML IV SOLN: 1.0000 mg | INTRAVENOUS | Status: DC | PRN

## 2021-03-31 MED ORDER — METHYLPREDNISOLONE SODIUM SUCC 40 MG IJ SOLR
20.0000 mg | Freq: Two times a day (BID) | INTRAMUSCULAR | Status: DC
  Administered 2021-03-31 – 2021-04-01 (×3): 20 mg via INTRAVENOUS
  Filled 2021-03-31 (×4): qty 1

## 2021-03-31 MED ORDER — SODIUM CHLORIDE 0.9 % IV BOLUS
500.0000 mL | Freq: Once | INTRAVENOUS | Status: AC
  Administered 2021-03-31: 500 mL via INTRAVENOUS

## 2021-03-31 MED ORDER — DOFETILIDE 250 MCG PO CAPS
250.0000 ug | ORAL_CAPSULE | Freq: Two times a day (BID) | ORAL | Status: DC
  Administered 2021-03-31 – 2021-04-04 (×8): 250 ug via ORAL
  Filled 2021-03-31 (×11): qty 1

## 2021-03-31 MED ORDER — MORPHINE SULFATE (PF) 2 MG/ML IV SOLN
2.0000 mg | Freq: Once | INTRAVENOUS | Status: AC
  Administered 2021-03-31: 2 mg via INTRAVENOUS
  Filled 2021-03-31: qty 1

## 2021-03-31 MED ORDER — ONDANSETRON HCL 4 MG/2ML IJ SOLN
4.0000 mg | Freq: Once | INTRAMUSCULAR | Status: AC
  Administered 2021-03-31: 4 mg via INTRAVENOUS
  Filled 2021-03-31: qty 2

## 2021-03-31 MED ORDER — SODIUM CHLORIDE 0.45 % IV SOLN: INTRAVENOUS | Status: DC

## 2021-03-31 MED ORDER — AFLIBERCEPT 2 MG/0.05ML IZ SOLN: Freq: Every day | INTRAVITREAL | Status: DC

## 2021-03-31 MED ORDER — IOHEXOL 300 MG/ML  SOLN
100.0000 mL | Freq: Once | INTRAMUSCULAR | Status: AC | PRN
  Administered 2021-03-31: 100 mL via INTRAVENOUS
  Filled 2021-03-31: qty 100

## 2021-03-31 MED ORDER — HYDRALAZINE HCL 20 MG/ML IJ SOLN: 10.0000 mg | Freq: Four times a day (QID) | INTRAMUSCULAR | Status: DC | PRN

## 2021-03-31 MED ORDER — POTASSIUM CHLORIDE CRYS ER 20 MEQ PO TBCR
40.0000 meq | EXTENDED_RELEASE_TABLET | Freq: Once | ORAL | Status: DC
  Filled 2021-03-31: qty 2

## 2021-03-31 MED ORDER — ONDANSETRON HCL 4 MG PO TABS: 4.0000 mg | ORAL_TABLET | Freq: Four times a day (QID) | ORAL | Status: DC | PRN

## 2021-03-31 MED ORDER — PANTOPRAZOLE SODIUM 40 MG IV SOLR
40.0000 mg | Freq: Every day | INTRAVENOUS | Status: DC
  Administered 2021-03-31 – 2021-04-01 (×2): 40 mg via INTRAVENOUS
  Filled 2021-03-31 (×3): qty 40

## 2021-03-31 NOTE — ED Triage Notes (Signed)
Pt comes into the ED via [facility] from home c/o abd pain and nausea that started yesterday.  Pt denies any diarrhea, but states she has excessive gas.  Pt denies any CP, SHOB, or dizziness.

## 2021-03-31 NOTE — Consult Note (Signed)
Lucilla Lame, MD [facility]  [address], [place], [place] [address] Phone: [phone] Fax : [phone]  Consultation  Referring Provider:     No ref. provider found Primary Care Physician:  Clarisse Gouge, MD Primary Gastroenterologist:  [facility]         Reason for Consultation:     Small bowel obstruction  Date of Admission:  03/31/2021 Date of Consultation:  03/31/2021         HPI:   Chelsey Williams is a 87 y.o. female Who comes in today with a history of recurrent small bowel obstruction in the terminal ileum.  The patient had been [facility] and had biopsies of the terminal ileum that showed repairing inflammation with features consistent with possible Crohn's. The patient had been brought to the ER because of nausea with vomiting and abdominal pain with distention.  The patient reports that she has not been passing gas.  She does have chronic diarrhea for which he takes Imodium.  Her EGD and colonoscopy in March 2020 showed also ulcers in the distal ileum with stenosis.  The patient has had a history of taking steroids with psychotic episodes from the steroids.  She was offered budesonide in the past but was also refusing that due to concerns about side effects.  The patient had a CT scan on admission that showed small bowel distention with a transition point involving the distal ileum.  The patient is accompanied by her son who was in the room with her today.  He is her primary caregiver.  Past Medical History:  Diagnosis Date   Anxiety    Atypical chest pain 11/07/2016   Bilateral hearing loss    Coronary artery disease, non-occlusive    a. LHC 11/2014: R dom, LM nl, mLAD 30-40, dLAD minor irregs, LCx minor irregs, RCA minor irregs w/ associated ectasia   Depression    GERD (gastroesophageal reflux disease)    Glaucoma    Hyperlipemia    Hypertension    Macular degeneration    OA (osteoarthritis)    Pacemaker    a. Medtronic Adapta [id number]  dual-chamber pacemaker (serial number [id number]) with Medtronic 2297 atrial lead (serial number [id number]) and Medtronic 5076 ventricular lead (serial number [id number]), all implanted 12/08/2014 by Dr. Jacklynn Barnacle at [facility] [phone].     Palpitations 08/16/2016   Persistent atrial fibrillation ([place])    a. s/p DCCV 02/2016; b. sotalol and Eliquis; c. CHADS2VASc --> 4 (HTN, age x 2, female)   Sinus node dysfunction ([place])     Past Surgical History:  Procedure Laterality Date   CHOLECYSTECTOMY     knee replacmen     PACEMAKER IMPLANT  2014   thyroid nodule      Prior to Admission medications   Medication Sig Start Date End Date Taking? Authorizing Provider  Aflibercept (EYLEA) 2 MG/0.05ML SOLN Apply to eye. 02/21/13  Yes [provider]  apixaban (ELIQUIS) 5 MG TABS tablet Take 1 tablet (5 mg total) by mouth 2 (two) times daily. 07/25/17  Yes Deboraha Sprang, MD  calcium carbonate (TUMS - DOSED IN MG ELEMENTAL CALCIUM) 500 MG chewable tablet Chew 1 tablet by mouth every other day.   Yes [provider]  Calcium Carbonate-Vitamin D (CALCIUM-VITAMIN D3 PO) Take 1 each by mouth daily.   Yes [provider]  dofetilide (TIKOSYN) 250 MCG capsule Take 250 mcg by mouth 2 (two) times daily. 02/16/21  Yes [provider]  ferrous sulfate 325 (65 FE) MG tablet Take 1 tablet by mouth daily with breakfast. 11/11/20 11/11/21 Yes [provider]  Hyoscyamine Sulfate SL 0.125 MG SUBL Place 0.125 mg under the tongue every 4 (four) hours as needed. 11/11/20  Yes [provider]  Lecithin 1200 MG CAPS Take by mouth.   Yes [provider]  olopatadine (PATANOL) 0.1 % ophthalmic solution Place 1 drop into the right eye 2 (two) times daily.  11/07/16  Yes [provider]  omeprazole (PRILOSEC) 20 MG capsule Take 20 mg by mouth daily as needed.    Yes [provider]  potassium chloride SA (KLOR-CON M) 20 MEQ tablet Take 20 mEq by  mouth daily. 12/24/20  Yes [provider]  Specialty Vitamins Products (MAGNESIUM, AMINO ACID CHELATE,) 133 MG tablet Take 2 tablets by mouth daily. 05/11/20  Yes [provider]  acetaminophen (TYLENOL) 325 MG tablet Take 325 mg by mouth every 4 (four) hours as needed.    [provider]  albuterol (PROVENTIL HFA;VENTOLIN HFA) 108 (90 Base) MCG/ACT inhaler Inhale 2 puffs into the lungs every 6 (six) hours as needed for wheezing or shortness of breath. 10/17/17 06/13/19  Glean Hess, MD  cholecalciferol (VITAMIN D) 1000 units tablet Take 1,000 Units by mouth daily. Patient not taking: Reported on 03/31/2021    [provider]  cholestyramine (QUESTRAN) 4 g packet Take by mouth 2 (two) times daily.  11/13/16   [provider]  folic acid (FOLVITE) 1 MG tablet Take 1 mg by mouth daily. Patient not taking: Reported on 03/31/2021    [provider]  hydrALAZINE (APRESOLINE) 10 MG tablet Take 20 mg by mouth 3 (three) times daily. Patient not taking: Reported on 03/31/2021 02/22/17   [provider]  HYDROcodone-acetaminophen (NORCO/VICODIN) 5-325 MG tablet take 1 tablet by mouth every 6 hours if needed for pain Patient not taking: Reported on 03/31/2021 10/31/16   [provider]  ipratropium (ATROVENT HFA) 17 MCG/ACT inhaler Inhale 2 puffs into the lungs 4 (four) times daily. 02/11/18 02/11/19  [provider]  lisinopril (PRINIVIL,ZESTRIL) 40 MG tablet Take 1 tablet (40 mg) by mouth once daily as directed 06/01/18   [provider]  loperamide (IMODIUM) 2 MG capsule Take 2 mg by mouth as needed for diarrhea or loose stools.    [provider]  Omega-3 Fatty Acids (FISH OIL) 1200 MG CAPS Take by mouth. Patient not taking: Reported on 03/31/2021    [provider]  psyllium (METAMUCIL) 58.6 % packet Take 1 packet by mouth daily as needed.    [provider]  sotalol (BETAPACE) 80 MG tablet Take  1 tablet (80 mg total) by mouth every 12 (twelve) hours. Patient not taking: Reported on 03/31/2021 12/31/19   Princella Pellegrini, MD  vitamin B-12 (CYANOCOBALAMIN) 1000 MCG tablet Take 1,000 mcg by mouth daily. Patient not taking: Reported on 03/31/2021    [provider]    Family History  Problem Relation Age of Onset   Hypertension Mother    Stroke Mother    Hypertension Father      Social History   Tobacco Use   Smoking status: Former    Types: Cigarettes    Quit date: 11/15/1999    Years since quitting: 21.3   Smokeless tobacco: Never  Vaping Use   Vaping Use: Never used  Substance Use Topics   Alcohol use: No   Drug use: No    Allergies as of  03/31/2021 - Review Complete 03/31/2021  Allergen Reaction Noted   Cephalexin Anaphylaxis 04/08/2012   Diltiazem hcl Swelling 11/06/2014   Hydrochlorothiazide Other (See Comments) 11/06/2014   Povidone-iodine Itching 09/30/2018   Prednisone Shortness Of Breath 11/06/2014   Felodipine Nausea Only 11/06/2014   Magnesium-containing compounds  06/18/2018   Other Other (See Comments) 04/08/2012   Quinolones  12/19/2018   Statins Other (See Comments) 11/06/2014   Amlodipine Other (See Comments) and Palpitations 11/06/2014   Atenolol Palpitations 11/06/2014    Review of Systems:    All systems reviewed and negative except where noted in HPI.   Physical Exam:  Vital signs in last 24 hours: Temp:  [97.7 F (36.5 C)-98.9 F (37.2 C)] 98 F (36.7 C) (12/08 1818) Pulse Rate:  [74-86] 78 (12/08 1818) Resp:  [16-27] 18 (12/08 1818) BP: (145-179)/(69-101) 145/69 (12/08 1818) SpO2:  [92 %-98 %] 95 % (12/08 1818) Weight:  [72.1 kg] 72.1 kg (12/08 0712)   General:   Pleasant, cooperative in NAD Head:  Normocephalic and atraumatic. Eyes:   No icterus.   Conjunctiva pink. PERRLA. Ears:  Normal auditory acuity. Neck:  Supple; no masses or thyroidomegaly Lungs: Respirations even and unlabored. Lungs clear to auscultation  bilaterally.   No wheezes, crackles, or rhonchi.  Heart:  Regular rate and rhythm;  Without murmur, clicks, rubs or gallops Abdomen:  Tympanic and distended with decreased bowel sounds. There is diffuse abdominal tenderness.  No appreciable masses or hepatomegaly.  No rebound or guarding.  Rectal:  Not performed. Msk:  Symmetrical without gross deformities.    Extremities:  Without edema, cyanosis or clubbing. Neurologic:  Alert and oriented x3;  grossly normal neurologically. Skin:  Intact without significant lesions or rashes. Cervical Nodes:  No significant cervical adenopathy. Psych:  Alert and cooperative. Normal affect.  LAB RESULTS: Recent Labs    03/31/21 0715  WBC 14.6*  HGB 14.0  HCT 42.6  PLT 320   BMET Recent Labs    03/31/21 0715  NA 134*  K 3.7  CL 100  CO2 26  GLUCOSE 169*  BUN 20  CREATININE 0.54  CALCIUM 9.1   LFT Recent Labs    03/31/21 0715  PROT 7.2  ALBUMIN 3.8  AST 31  ALT 17  ALKPHOS 64  BILITOT 0.6   PT/INR No results for input(s): LABPROT, INR in the last 72 hours.  STUDIES: CT ABDOMEN PELVIS W CONTRAST  Result Date: 03/31/2021 CLINICAL DATA:  Acute generalized abdominal pain, nausea, vomiting. EXAM: CT ABDOMEN AND PELVIS WITH CONTRAST TECHNIQUE: Multidetector CT imaging of the abdomen and pelvis was performed using the standard protocol following bolus administration of intravenous contrast. CONTRAST:  113m OMNIPAQUE IOHEXOL 300 MG/ML  SOLN COMPARISON:  January 26, 2019. FINDINGS: Lower chest: Visualized lung bases are unremarkable. Moderate size sliding-type hiatal hernia is noted. Hepatobiliary: Status post cholecystectomy. Intrahepatic and extrahepatic biliary dilatation is noted which may be due to post cholecystectomy status. No focal liver abnormality is noted. Pancreas: Unremarkable. No pancreatic ductal dilatation or surrounding inflammatory changes. Spleen: Normal in size without focal abnormality. Adrenals/Urinary Tract: Adrenal  glands appear normal. Bilateral renal cysts. No hydronephrosis or renal obstruction is noted. Urinary bladder is unremarkable. Stomach/Bowel: The stomach and appendix are unremarkable. There is noted moderate small bowel dilatation with transition zone seen in the distal ileum in the right lower quadrant of the pelvis, best seen on image number 58 of series 2. Potentially this may be due to adhesion or possible focal inflammation involving this  portion of the ileum. No colonic dilatation is noted. Vascular/Lymphatic: Aortic atherosclerosis. No enlarged abdominal or pelvic lymph nodes. Reproductive: Uterus and bilateral adnexa are unremarkable. Other: No abdominal wall hernia or abnormality. No abdominopelvic ascites. Musculoskeletal: No acute or significant osseous findings. IMPRESSION: Moderate small bowel dilatation is noted with transition zone seen in the right lower quadrant involving the distal ileum, concerning for partial small bowel obstruction. It is uncertain if this is due to adhesion or possibly mild inflammatory changes involving this portion of the ileum. Moderate size sliding-type hiatal hernia is noted. Aortic Atherosclerosis (ICD10-I70.0). Electronically Signed   By: Marijo Conception M.D.   On: 03/31/2021 09:38      Impression / Plan:   Assessment: Principal Problem:   Partial small bowel obstruction (HCC) Active Problems:   Coronary artery disease, non-occlusive   Episodic atrial fibrillation (HCC)   Bilateral hearing loss   Hiatal hernia with gastroesophageal reflux   Chelsey Williams is a 87 y.o. y/o female with Recurrent small bowel obstruction in biopsies suggestive of possible Crohn's disease.  The patient will be started on 20 mg of Solu-Medrol.  Her allergy is listed as shortness of breath but on discussion with the patient and her son it appears that she gets confused when taking steroids.   Plan:  The patient will be started on a low dose of 20 mg of Solu-Medrol every 12  hours to see if she handles this without any side effects and then can be increased or decreased accordingly.  The patient has resolved on her own in the past. The patient and her son have been explained the risks of doing nothing and the possibility of intestinal rupture versus nausea vomiting and possible aspiration. That is when they decided to try a low-dose of the steroids.  The patient and her son have been explained the plan and agree with it.  Thank you for involving me in the care of this patient.      LOS: 0 days   Lucilla Lame, MD, [facility] 03/31/2021, 7:21 PM,  Pager [phone] 7am-5pm  Check AMION for 5pm -7am coverage and on weekends   Note: This dictation was prepared with Dragon dictation along with smaller phrase technology. Any transcriptional errors that result from this process are unintentional.

## 2021-03-31 NOTE — ED Notes (Signed)
Report given to [facility]

## 2021-03-31 NOTE — ED Notes (Signed)
Pt informed about NG tube order, pt states, "absolutely not, they've tried that in the past and it can't pass through mu nostrils, either side." Admitting dr at bedside with pt stating this

## 2021-03-31 NOTE — ED Notes (Signed)
Pt appearing anxious. States "I'm not breathing". Pt is breathing. SPO2 is 96% on RA. Son was at door calling nurse into room. Pt now going to CT.

## 2021-03-31 NOTE — Consult Note (Signed)
SURGICAL CONSULTATION NOTE   HISTORY OF PRESENT ILLNESS (HPI):  87 y.o. female presented to [facility] ED for evaluation of abdominal pain. Patient reports started with abdominal pain since yesterday.  Patient described abdominal pain as burning in the upper abdomen that goes up to her chest.  Endorses having nausea and vomiting.  Pain does not radiate to other part of the body.  There has been no alleviating or grading factors.  At the ED she was found with mild leukocytosis.  CT scan of the abdomen and pelvis shows small bowel dilation with transition point at the distal ileum.  I personally evaluated the images.  I can identify enhancement of the distal ileum.  No free air or free fluid.  I did extensive review of the chart.  Patient was evaluated by gastroenterology in June 2022.  She was diagnosed with Crohn's disease.  She was offered steroid treatment versus biological infusions and she refused both.  Surgery is consulted by Dr. Joni Fears in this context for evaluation and management of small bowel obstruction.  PAST MEDICAL HISTORY (PMH):  Past Medical History:  Diagnosis Date   Anxiety    Atypical chest pain 11/07/2016   Bilateral hearing loss    Coronary artery disease, non-occlusive    a. LHC 11/2014: R dom, LM nl, mLAD 30-40, dLAD minor irregs, LCx minor irregs, RCA minor irregs w/ associated ectasia   Depression    GERD (gastroesophageal reflux disease)    Glaucoma    Hyperlipemia    Hypertension    Macular degeneration    OA (osteoarthritis)    Pacemaker    a. Medtronic Adapta ADDRL1 dual-chamber pacemaker (serial number [id number]) with Medtronic 4098 atrial lead (serial number [id number]) and Medtronic 5076 ventricular lead (serial number [id number]), all implanted 12/08/2014 by Dr. Jacklynn Barnacle at [facility] [phone].     Palpitations 08/16/2016   Persistent atrial fibrillation ([place])    a. s/p DCCV 02/2016; b. sotalol and Eliquis; c. CHADS2VASc --> 4 (HTN, age x 2, female)    Sinus node dysfunction ([place])      PAST SURGICAL HISTORY ([place]):  Past Surgical History:  Procedure Laterality Date   CHOLECYSTECTOMY     knee replacmen     PACEMAKER IMPLANT  2014   thyroid nodule       MEDICATIONS:  Prior to Admission medications   Medication Sig Start Date End Date Taking? Authorizing Provider  albuterol (PROVENTIL HFA;VENTOLIN HFA) 108 (90 Base) MCG/ACT inhaler Inhale 2 puffs into the lungs every 6 (six) hours as needed for wheezing or shortness of breath. 10/17/17 06/13/19  Glean Hess, MD  apixaban (ELIQUIS) 5 MG TABS tablet Take 1 tablet (5 mg total) by mouth 2 (two) times daily. 07/25/17   Deboraha Sprang, MD  Calcium Carbonate-Vitamin D (CALCIUM-VITAMIN D3 PO) Take 1 each by mouth daily.    [provider]  cholecalciferol (VITAMIN D) 1000 units tablet Take 1,000 Units by mouth daily.    [provider]  cholestyramine (QUESTRAN) 4 g packet Take by mouth 2 (two) times daily.  11/13/16   [provider]  folic acid (FOLVITE) 1 MG tablet Take 1 mg by mouth daily.    [provider]  hydrALAZINE (APRESOLINE) 10 MG tablet Take 20 mg by mouth 3 (three) times daily. 02/22/17   [provider]  HYDROcodone-acetaminophen (NORCO/VICODIN) 5-325 MG tablet take 1 tablet by mouth every 6 hours if needed for pain 10/31/16   [provider]  ipratropium (ATROVENT HFA) 17  MCG/ACT inhaler Inhale 2 puffs into the lungs 4 (four) times daily. 02/11/18 02/11/19  [provider]  Lecithin 1200 MG CAPS Take by mouth.    [provider]  lisinopril (PRINIVIL,ZESTRIL) 40 MG tablet Take 1 tablet (40 mg) by mouth once daily as directed 06/01/18   [provider]  loperamide (IMODIUM) 2 MG capsule Take 2 mg by mouth as needed for diarrhea or loose stools.    [provider]  olopatadine (PATANOL) 0.1 % ophthalmic solution Place 1 drop into the right eye 2 (two) times daily.  11/07/16   [provider]  Omega-3 Fatty Acids (FISH OIL) 1200 MG CAPS Take by mouth.    [provider]  omeprazole (PRILOSEC) 20 MG capsule Take 20 mg by mouth daily as needed.     [provider]  psyllium (METAMUCIL) 58.6 % packet Take 1 packet by mouth daily as needed.    [provider]  sotalol (BETAPACE) 80 MG tablet Take 1 tablet (80 mg total) by mouth every 12 (twelve) hours. 12/31/19   Coniglio, Alcario Drought, MD  vitamin B-12 (CYANOCOBALAMIN) 1000 MCG tablet Take 1,000 mcg by mouth daily.    [provider]     ALLERGIES:  Allergies  Allergen Reactions   Cephalexin Anaphylaxis    Shock / Unconsciousness   Diltiazem Hcl Swelling   Hydrochlorothiazide Other (See Comments)    dizziness dizziness   Prednisone Shortness Of Breath    SOB   Felodipine Nausea Only    Gastrointestinal problems, e.g., nausea, vomiting, diarrheaShock / Unconsciousnessmuscle tenderness   Magnesium-Containing Compounds     Diarrhea   Other Other (See Comments)    A blood pressure med A blood pressure med   Quinolones     Aortic aneurysm    Statins Other (See Comments)   Amlodipine Other (See Comments) and Palpitations   Atenolol Palpitations    heart palpitations     SOCIAL HISTORY:  Social History   Socioeconomic History   Marital status: Widowed    Spouse name: Not on file   Number of children: Not on file   Years of education: Not on file   Highest education level: Not on file  Occupational History   Not on file  Tobacco Use   Smoking status: Former    Types: Cigarettes    Quit date: 11/15/1999    Years since quitting: 21.3   Smokeless tobacco: Never  Vaping Use   Vaping Use: Never used  Substance and Sexual Activity   Alcohol use: No   Drug use: No   Sexual activity: Not Currently  Other Topics Concern   Not on file  Social History Narrative   Not on file   Social Determinants of Health   Financial Resource Strain: Not on file  Food Insecurity: Not on file   Transportation Needs: Not on file  Physical Activity: Not on file  Stress: Not on file  Social Connections: Not on file  Intimate Partner Violence: Not on file      FAMILY HISTORY:  Family History  Problem Relation Age of Onset   Hypertension Mother    Stroke Mother    Hypertension Father      REVIEW OF SYSTEMS:  Constitutional: denies weight loss, fever, chills, or sweats  Eyes: denies any other vision changes, history of eye injury  ENT: denies sore throat, hearing problems  Respiratory: denies shortness of breath, wheezing  Cardiovascular: denies chest pain, palpitations  Gastrointestinal: positive abdominal  pain, nausea and vomiting Genitourinary: denies burning with urination or urinary frequency Musculoskeletal: denies any other joint pains or cramps  Skin: denies any other rashes or skin discolorations  Neurological: denies any other headache, dizziness, weakness  Psychiatric: denies any other depression, anxiety   All other review of systems were negative   VITAL SIGNS:  Temp:  [97.7 F (36.5 C)] 97.7 F (36.5 C) (12/08 0711) Pulse Rate:  [74-77] 74 (12/08 0815) Resp:  [17] 17 (12/08 0711) BP: (156-176)/(73-101) 176/101 (12/08 0815) SpO2:  [94 %-98 %] 98 % (12/08 0815) Weight:  [72.1 kg] 72.1 kg (12/08 0712)     Height: 5' 3"  (160 cm) Weight: 72.1 kg BMI (Calculated): 28.16   INTAKE/OUTPUT:  This shift: Total I/O In: 500 [IV Piggyback:500] Out: -   Last 2 shifts: @IOLAST2SHIFTS @   PHYSICAL EXAM:  Constitutional:  -- Normal body habitus  -- Awake, alert, and oriented x3  Eyes:  -- Pupils equally round and reactive to light  -- No scleral icterus  Ear, nose, and throat:  -- No jugular venous distension  Pulmonary:  -- No crackles  -- Equal breath sounds bilaterally -- Breathing non-labored at rest Cardiovascular:  -- S1, S2 present  -- No pericardial rubs Gastrointestinal:  -- Abdomen soft, tender, distended, no guarding or rebound  tenderness -- No abdominal masses appreciated, pulsatile or otherwise  Musculoskeletal and Integumentary:  -- Wounds: None appreciated -- Extremities: B/L UE and LE FROM, hands and feet warm, no edema  Neurologic:  -- Motor function: intact and symmetric -- Sensation: intact and symmetric   Labs:  CBC Latest Ref Rng & Units 03/31/2021 01/26/2019 06/15/2018  WBC 4.0 - 10.5 K/uL 14.6(H) 14.2(H) 11.4(H)  Hemoglobin 12.0 - 15.0 g/dL 14.0 13.6 11.8(L)  Hematocrit 36.0 - 46.0 % 42.6 42.1 37.8  Platelets 150 - 400 K/uL 320 323 314   CMP Latest Ref Rng & Units 03/31/2021 01/26/2019 07/01/2018  Glucose 70 - 99 mg/dL 169(H) 167(H) 115(H)  BUN 8 - 23 mg/dL 20 22 19   Creatinine 0.44 - 1.00 mg/dL 0.54 0.60 0.80  Sodium 135 - 145 mmol/L 134(L) 139 140  Potassium 3.5 - 5.1 mmol/L 3.7 3.6 4.3  Chloride 98 - 111 mmol/L 100 106 103  CO2 22 - 32 mmol/L 26 24 29   Calcium 8.9 - 10.3 mg/dL 9.1 8.8(L) 9.0  Total Protein 6.5 - 8.1 g/dL 7.2 6.9 -  Total Bilirubin 0.3 - 1.2 mg/dL 0.6 0.8 -  Alkaline Phos 38 - 126 U/L 64 66 -  AST 15 - 41 U/L 31 30 -  ALT 0 - 44 U/L 17 19 -    Imaging studies:  EXAM: CT ABDOMEN AND PELVIS WITH CONTRAST   TECHNIQUE: Multidetector CT imaging of the abdomen and pelvis was performed using the standard protocol following bolus administration of intravenous contrast.   CONTRAST:  18m OMNIPAQUE IOHEXOL 300 MG/ML  SOLN   COMPARISON:  January 26, 2019.   FINDINGS: Lower chest: Visualized lung bases are unremarkable. Moderate size sliding-type hiatal hernia is noted.   Hepatobiliary: Status post cholecystectomy. Intrahepatic and extrahepatic biliary dilatation is noted which may be due to post cholecystectomy status. No focal liver abnormality is noted.   Pancreas: Unremarkable. No pancreatic ductal dilatation or surrounding inflammatory changes.   Spleen: Normal in size without focal abnormality.   Adrenals/Urinary Tract: Adrenal glands appear normal.  Bilateral renal cysts. No hydronephrosis or renal obstruction is noted. Urinary bladder is unremarkable.   Stomach/Bowel: The stomach and appendix are  unremarkable. There is noted moderate small bowel dilatation with transition zone seen in the distal ileum in the right lower quadrant of the pelvis, best seen on image number 58 of series 2. Potentially this may be due to adhesion or possible focal inflammation involving this portion of the ileum. No colonic dilatation is noted.   Vascular/Lymphatic: Aortic atherosclerosis. No enlarged abdominal or pelvic lymph nodes.   Reproductive: Uterus and bilateral adnexa are unremarkable.   Other: No abdominal wall hernia or abnormality. No abdominopelvic ascites.   Musculoskeletal: No acute or significant osseous findings.   IMPRESSION: Moderate small bowel dilatation is noted with transition zone seen in the right lower quadrant involving the distal ileum, concerning for partial small bowel obstruction. It is uncertain if this is due to adhesion or possibly mild inflammatory changes involving this portion of the ileum.   Moderate size sliding-type hiatal hernia is noted.   Aortic Atherosclerosis (ICD10-I70.0).     Electronically Signed   By: Marijo Conception M.D.   On: 03/31/2021 09:38  Inflammatory changes on the ileum:   Assessment/Plan:  87 y.o. female with small bowel obstruction, complicated by pertinent comorbidities including Crohn's disease, paroxysmal atrial fibrillation with pacemaker, hypertension.  Evaluation patient found with small bowel obstruction most likely coming from inflammatory changes of the ileum.  From the chart patient was diagnosed with Crohn disease and treatment was offered but patient refused due to side effect of steroids and due to high cost of biological treatments infusions.  As per patient she endorses that she does not have Crohn's.  She says that her condition is recurrent infection of the ear  GI that is treated with antibiotic therapy in the past.  Regardless the cause, I recommend small bowel decompression with NGT.  Patient is refusing NGT due to previous nasal surgery.  -I recommend to consult IR to see if she is a candidate for placement of NGT under fluoroscopic evaluation.  I also recommend palliative care consult for discussion of goals of care as per discussion with patient and the son patient has refused treatment for Crohn's due to her age.  If she does not improve with medical management and she needs surgical management it would be a major surgery including needing off ostomy creation and living with an ostomy.  I will continue to follow a surgical management as a last option for her condition does not resolve with medical management.   Arnold Long, MD

## 2021-03-31 NOTE — Progress Notes (Signed)
Patient admitted and oriented to the room. Fall risk precautions in place. VSS. Tele set up and working correctly. Fluids resumed.SCDs on.

## 2021-03-31 NOTE — ED Notes (Signed)
Informed Rn bed assigned

## 2021-03-31 NOTE — H&P (Addendum)
History and Physical    Evellyn Tuff JIR:[id number] DOB: 12/14/1927 DOA: 03/31/2021  PCP: Clarisse Gouge, MD   Patient coming from: Home  I have personally briefly reviewed patient's old medical records in [place]  Chief Complaint: Nausea/vomiting  HPI: Chelsey Williams is a 87 y.o. female with medical history significant for ??  Crohn's disease, macular degeneration, GERD, atrial fibrillation, coronary artery disease, status post pacemaker insertion for sinus node dysfunction who presents to the emergency room via EMS for evaluation of 1 day history of nausea, vomiting and periumbilical abdominal pain which she rated 10 x 10 in intensity at its worst. Abdominal pain is mostly in the periumbilical area and is described as a burning sensation and is nonradiating and has been associated with nausea and multiple episodes of bilious emesis.  Last bowel movement was 1 day prior to admission and she states that she had loose stools.  She takes Imodium almost on a daily basis for diarrhea. She has had cholecystectomy done in the past as well as polypectomy. She denies having any fever, no chills, no cough, no headache, no dizziness, no lightheadedness, no palpitations, no diaphoresis, no urinary frequency, no nocturia, no dysuria, no lower extremity swelling. Chart review shows patient had an EGD and colonoscopy in March 2022 which showed multiple ulcers in the distal ileum with relative sparing of the terminal ileum and also a potential area of narrowing and stenosis in the distal ileum.  She was offered budesonide therapy which she declined due to concern for side effects.  She has a history of chronic diarrhea and takes Imodium daily as needed. Sodium 134, potassium 3.7, chloride 100, bicarb 26, glucose 169, BUN 20, creatinine 0.54, calcium 9.1, alkaline phosphatase 64, albumin 3.8, lipase 35, AST 31, ALT 17, total protein 7.2, white count 14.6, hemoglobin 14.0, hematocrit 42.6, MCV 89.5, RDW  14.0, platelet count 320 Respiratory viral panel is negative CT scan of abdomen and pelvis shows moderate small bowel dilatation is noted with transition zone seen in the right lower quadrant involving the distal ileum, concerning for partial small bowel obstruction.  It is uncertain if this is due to adhesion or possibly mild inflammatory changes involving this portion of the ileum.  Moderate sized sliding-type hiatal hernia.     ED Course: Patient is a 87 year old female who presents to the ER for evaluation of worsening periumbilical pain described as a burning sensation associated with nausea and multiple episodes of emesis. Imaging shows a moderate sized sliding-type hiatal hernia as well as evidence of partial small bowel obstruction. She will be admitted to the hospital for further evaluation.    Review of Systems: As per HPI otherwise all other systems reviewed and negative.    Past Medical History:  Diagnosis Date   Anxiety    Atypical chest pain 11/07/2016   Bilateral hearing loss    Coronary artery disease, non-occlusive    a. LHC 11/2014: R dom, LM nl, mLAD 30-40, dLAD minor irregs, LCx minor irregs, RCA minor irregs w/ associated ectasia   Depression    GERD (gastroesophageal reflux disease)    Glaucoma    Hyperlipemia    Hypertension    Macular degeneration    OA (osteoarthritis)    Pacemaker    a. Medtronic Adapta ADDRL1 dual-chamber pacemaker (serial number [id number]) with Medtronic 7510 atrial lead (serial number [id number]) and Medtronic 5076 ventricular lead (serial number [id number]), all implanted 12/08/2014 by Dr. Jacklynn Barnacle at [facility] [phone].     Palpitations  08/16/2016   Persistent atrial fibrillation ([place])    a. s/p DCCV 02/2016; b. sotalol and Eliquis; c. CHADS2VASc --> 4 (HTN, age x 2, female)   Sinus node dysfunction ([place])     Past Surgical History:  Procedure Laterality Date   CHOLECYSTECTOMY     knee replacmen     PACEMAKER IMPLANT  2014    thyroid nodule       reports that she quit smoking about 21 years ago. She has never used smokeless tobacco. She reports that she does not drink alcohol and does not use drugs.  Allergies  Allergen Reactions   Cephalexin Anaphylaxis    Shock / Unconsciousness   Diltiazem Hcl Swelling   Hydrochlorothiazide Other (See Comments)    dizziness dizziness   Povidone-Iodine Itching   Prednisone Shortness Of Breath    SOB   Felodipine Nausea Only    Gastrointestinal problems, e.g., nausea, vomiting, diarrheaShock / Unconsciousnessmuscle tenderness   Magnesium-Containing Compounds     Diarrhea   Other Other (See Comments)    A blood pressure med STEROIDS   Quinolones     Aortic aneurysm    Statins Other (See Comments)   Amlodipine Other (See Comments) and Palpitations   Atenolol Palpitations    heart palpitations    Family History  Problem Relation Age of Onset   Hypertension Mother    Stroke Mother    Hypertension Father       Prior to Admission medications   Medication Sig Start Date End Date Taking? Authorizing Provider  Aflibercept (EYLEA) 2 MG/0.05ML SOLN Apply to eye. 02/21/13  Yes [provider]  apixaban (ELIQUIS) 5 MG TABS tablet Take 1 tablet (5 mg total) by mouth 2 (two) times daily. 07/25/17  Yes Deboraha Sprang, MD  calcium carbonate (TUMS - DOSED IN MG ELEMENTAL CALCIUM) 500 MG chewable tablet Chew 1 tablet by mouth every other day.   Yes [provider]  Calcium Carbonate-Vitamin D (CALCIUM-VITAMIN D3 PO) Take 1 each by mouth daily.   Yes [provider]  dofetilide (TIKOSYN) 250 MCG capsule Take 250 mcg by mouth 2 (two) times daily. 02/16/21  Yes [provider]  ferrous sulfate 325 (65 FE) MG tablet Take 1 tablet by mouth daily with breakfast. 11/11/20 11/11/21 Yes [provider]  Hyoscyamine Sulfate SL 0.125 MG SUBL Place 0.125 mg under the tongue every 4 (four) hours as needed. 11/11/20  Yes [provider]   Lecithin 1200 MG CAPS Take by mouth.   Yes [provider]  olopatadine (PATANOL) 0.1 % ophthalmic solution Place 1 drop into the right eye 2 (two) times daily.  11/07/16  Yes [provider]  omeprazole (PRILOSEC) 20 MG capsule Take 20 mg by mouth daily as needed.    Yes [provider]  potassium chloride SA (KLOR-CON M) 20 MEQ tablet Take 20 mEq by mouth daily. 12/24/20  Yes [provider]  Specialty Vitamins Products (MAGNESIUM, AMINO ACID CHELATE,) 133 MG tablet Take 2 tablets by mouth daily. 05/11/20  Yes [provider]  acetaminophen (TYLENOL) 325 MG tablet Take 325 mg by mouth every 4 (four) hours as needed.    [provider]  albuterol (PROVENTIL HFA;VENTOLIN HFA) 108 (90 Base) MCG/ACT inhaler Inhale 2 puffs into the lungs every 6 (six) hours as needed for wheezing or shortness of breath. 10/17/17 06/13/19  Glean Hess, MD  cholecalciferol (VITAMIN D) 1000 units tablet Take 1,000 Units by mouth daily. Patient not taking: Reported  on 03/31/2021    [provider]  cholestyramine (QUESTRAN) 4 g packet Take by mouth 2 (two) times daily.  11/13/16   [provider]  folic acid (FOLVITE) 1 MG tablet Take 1 mg by mouth daily. Patient not taking: Reported on 03/31/2021    [provider]  hydrALAZINE (APRESOLINE) 10 MG tablet Take 20 mg by mouth 3 (three) times daily. Patient not taking: Reported on 03/31/2021 02/22/17   [provider]  HYDROcodone-acetaminophen (NORCO/VICODIN) 5-325 MG tablet take 1 tablet by mouth every 6 hours if needed for pain Patient not taking: Reported on 03/31/2021 10/31/16   [provider]  ipratropium (ATROVENT HFA) 17 MCG/ACT inhaler Inhale 2 puffs into the lungs 4 (four) times daily. 02/11/18 02/11/19  [provider]  lisinopril (PRINIVIL,ZESTRIL) 40 MG tablet Take 1 tablet (40 mg) by mouth once daily as directed 06/01/18   [provider]  loperamide  (IMODIUM) 2 MG capsule Take 2 mg by mouth as needed for diarrhea or loose stools.    [provider]  Omega-3 Fatty Acids (FISH OIL) 1200 MG CAPS Take by mouth. Patient not taking: Reported on 03/31/2021    [provider]  psyllium (METAMUCIL) 58.6 % packet Take 1 packet by mouth daily as needed.    [provider]  sotalol (BETAPACE) 80 MG tablet Take 1 tablet (80 mg total) by mouth every 12 (twelve) hours. Patient not taking: Reported on 03/31/2021 12/31/19   Princella Pellegrini, MD  vitamin B-12 (CYANOCOBALAMIN) 1000 MCG tablet Take 1,000 mcg by mouth daily. Patient not taking: Reported on 03/31/2021    [provider]    Physical Exam: Vitals:   03/31/21 0711 03/31/21 0712 03/31/21 0815  BP: (!) 156/73  (!) 176/101  Pulse: 77  74  Resp: 17    Temp: 97.7 F (36.5 C)    TempSrc: Oral    SpO2: 94%  98%  Weight:  72.1 kg   Height:  5' 3"  (1.6 m)      Vitals:   03/31/21 0711 03/31/21 0712 03/31/21 0815  BP: (!) 156/73  (!) 176/101  Pulse: 77  74  Resp: 17    Temp: 97.7 F (36.5 C)    TempSrc: Oral    SpO2: 94%  98%  Weight:  72.1 kg   Height:  5' 3"  (1.6 m)       Constitutional: Alert and oriented x 3 . Not in any apparent distress HEENT:      Head: Normocephalic and atraumatic.         Eyes: PERLA, EOMI, Conjunctivae are normal. Sclera is non-icteric.       Mouth/Throat: Mucous membranes are moist.       Neck: Supple with no signs of meningismus. Cardiovascular: Regular rate and rhythm. No murmurs, gallops, or rubs. 2+ symmetrical distal pulses are present . No JVD. No LE edema Respiratory: Respiratory effort normal .Lungs sounds clear bilaterally. No wheezes, crackles, or rhonchi.  Gastrointestinal: Soft, diffusely tender, and non distended with hypoactive bowel sounds.  Genitourinary: No CVA tenderness. Musculoskeletal: Nontender with normal range of motion in all extremities. No cyanosis, or erythema of extremities. Neurologic:   Face is symmetric. Moving all extremities. No gross focal neurologic deficits . Skin: Skin is warm, dry.  No rash or ulcers Psychiatric: Mood and affect are normal    Labs on Admission: I have personally reviewed following labs and imaging studies  CBC: Recent Labs  Lab 03/31/21 0715  WBC 14.6*  HGB 14.0  HCT 42.6  MCV 89.5  PLT 979   Basic Metabolic Panel: Recent Labs  Lab 03/31/21 0715  NA 134*  K 3.7  CL 100  CO2 26  GLUCOSE 169*  BUN 20  CREATININE 0.54  CALCIUM 9.1   GFR: Estimated Creatinine Clearance: 43.6 mL/min (by C-G formula based on SCr of 0.54 mg/dL). Liver Function Tests: Recent Labs  Lab 03/31/21 0715  AST 31  ALT 17  ALKPHOS 64  BILITOT 0.6  PROT 7.2  ALBUMIN 3.8   Recent Labs  Lab 03/31/21 0715  LIPASE 35   No results for input(s): AMMONIA in the last 168 hours. Coagulation Profile: No results for input(s): INR, PROTIME in the last 168 hours. Cardiac Enzymes: No results for input(s): CKTOTAL, CKMB, CKMBINDEX, TROPONINI in the last 168 hours. BNP (last 3 results) No results for input(s): PROBNP in the last 8760 hours. HbA1C: No results for input(s): HGBA1C in the last 72 hours. CBG: No results for input(s): GLUCAP in the last 168 hours. Lipid Profile: No results for input(s): CHOL, HDL, LDLCALC, TRIG, CHOLHDL, LDLDIRECT in the last 72 hours. Thyroid Function Tests: No results for input(s): TSH, T4TOTAL, FREET4, T3FREE, THYROIDAB in the last 72 hours. Anemia Panel: No results for input(s): VITAMINB12, FOLATE, FERRITIN, TIBC, IRON, RETICCTPCT in the last 72 hours. Urine analysis:    Component Value Date/Time   COLORURINE YELLOW (A) 06/12/2018 1233   APPEARANCEUR CLEAR (A) 06/12/2018 1233   APPEARANCEUR Clear 04/03/2012 1431   LABSPEC >1.046 (H) 06/12/2018 1233   LABSPEC 1.005 04/03/2012 1431   PHURINE 5.0 06/12/2018 1233   GLUCOSEU NEGATIVE 06/12/2018 1233   GLUCOSEU Negative 04/03/2012 1431   HGBUR SMALL (A) 06/12/2018 1233    BILIRUBINUR NEGATIVE 06/12/2018 1233   BILIRUBINUR Negative 04/03/2012 1431   KETONESUR 20 (A) 06/12/2018 1233   PROTEINUR NEGATIVE 06/12/2018 1233   NITRITE NEGATIVE 06/12/2018 1233   LEUKOCYTESUR NEGATIVE 06/12/2018 1233   LEUKOCYTESUR Negative 04/03/2012 1431    Radiological Exams on Admission: CT ABDOMEN PELVIS W CONTRAST  Result Date: 03/31/2021 CLINICAL DATA:  Acute generalized abdominal pain, nausea, vomiting. EXAM: CT ABDOMEN AND PELVIS WITH CONTRAST TECHNIQUE: Multidetector CT imaging of the abdomen and pelvis was performed using the standard protocol following bolus administration of intravenous contrast. CONTRAST:  125m OMNIPAQUE IOHEXOL 300 MG/ML  SOLN COMPARISON:  January 26, 2019. FINDINGS: Lower chest: Visualized lung bases are unremarkable. Moderate size sliding-type hiatal hernia is noted. Hepatobiliary: Status post cholecystectomy. Intrahepatic and extrahepatic biliary dilatation is noted which may be due to post cholecystectomy status. No focal liver abnormality is noted. Pancreas: Unremarkable. No pancreatic ductal dilatation or surrounding inflammatory changes. Spleen: Normal in size without focal abnormality. Adrenals/Urinary Tract: Adrenal glands appear normal. Bilateral renal cysts. No hydronephrosis or renal obstruction is noted. Urinary bladder is unremarkable. Stomach/Bowel: The stomach and appendix are unremarkable. There is noted moderate small bowel dilatation with transition zone seen in the distal ileum in the right lower quadrant of the pelvis, best seen on image number 58 of series 2. Potentially this may be due to adhesion or possible focal inflammation involving this portion of the ileum. No colonic dilatation is noted. Vascular/Lymphatic: Aortic atherosclerosis. No enlarged abdominal or pelvic lymph nodes. Reproductive: Uterus and bilateral adnexa are unremarkable. Other: No abdominal wall hernia or abnormality. No abdominopelvic ascites. Musculoskeletal: No acute or  significant osseous findings. IMPRESSION: Moderate small bowel dilatation is noted with transition zone seen in the right lower quadrant involving the distal ileum, concerning for  partial small bowel obstruction. It is uncertain if this is due to adhesion or possibly mild inflammatory changes involving this portion of the ileum. Moderate size sliding-type hiatal hernia is noted. Aortic Atherosclerosis (ICD10-I70.0). Electronically Signed   By: Marijo Conception M.D.   On: 03/31/2021 09:38     Assessment/Plan Principal Problem:   Partial small bowel obstruction (HCC) Active Problems:   Coronary artery disease, non-occlusive   Episodic atrial fibrillation (HCC)   Bilateral hearing loss   Hiatal hernia with gastroesophageal reflux      Patient is a 87 year old female admitted to the hospital for partial small bowel obstruction.   Partial small bowel obstruction Patient presents to the emergency room for evaluation of periumbilical pain associated with nausea and vomiting Imaging shows a moderate sized sliding-type hiatal hernia and evidence of a partial small bowel obstruction. NG tube decompression was recommended but patient declined Supportive care with IV fluid hydration, pain medication, antiemetics and IV PPI    Hiatal hernia with GERD Place patient on IV PPI and antiemetics as needed    History of paroxysmal atrial fibrillation Hold Eliquis for now Continue Tikosyn for rate control    Hypertension Will place patient on as needed IV hydralazine for systolic blood pressure greater than 113mHg  DVT prophylaxis: SCD  Code Status: full code  Family Communication: Greater than 50% of time was spent discussing patient's condition and plan of care with patient and her son at the bedside.  All questions and concerns have been addressed.  They verbalized understanding and agree with the plan.  CODE STATUS was discussed and she is a full code Disposition Plan: Back to previous home  environment Consults called: Gastroenterology/surgery Status:At the time of admission, it appears that the appropriate admission status for this patient is inpatient. This is judged to be reasonable and necessary to provide the required intensity of service to ensure the patient's safety given the presenting symptoms, physical exam findings, and initial radiographic and laboratory data in the context of their comorbid conditions. Patient requires inpatient status due to high intensity of service, high risk for further deterioration and high frequency of surveillance required.     TCollier BullockMD [facility]     03/31/2021, 11:31 AM

## 2021-03-31 NOTE — ED Provider Notes (Signed)
[facility] Emergency Department Provider Note  ____________________________________________  Time seen: Approximately 8:13 AM  I have reviewed the triage vital signs and the nursing notes.   HISTORY  Chief Complaint Abdominal Pain    HPI Chelsey Williams is a 87 y.o. female who c/o HTN, PAF, pacemaker who comes to ED c/o gen. Abd pain, constant, severe, non radiating, assoc. With n/v and increased flatus since last night.  No aggravating or alleviating factors. Emesis is green. No black/bloody output. No fever. No sick contacts.      Past Medical History:  Diagnosis Date  . Anxiety   . Atypical chest pain 11/07/2016  . Bilateral hearing loss   . Coronary artery disease, non-occlusive    a. LHC 11/2014: R dom, LM nl, mLAD 30-40, dLAD minor irregs, LCx minor irregs, RCA minor irregs w/ associated ectasia  . Depression   . GERD (gastroesophageal reflux disease)   . Glaucoma   . Hyperlipemia   . Hypertension   . Macular degeneration   . OA (osteoarthritis)   . Pacemaker    a. Medtronic Adapta ADDRL1 dual-chamber pacemaker (serial number [id number]) with Medtronic 0712 atrial lead (serial number [id number]) and Medtronic 5076 ventricular lead (serial number [id number]), all implanted 12/08/2014 by Dr. Jacklynn Barnacle at [facility] [phone].    Marland Kitchen Palpitations 08/16/2016  . Persistent atrial fibrillation ([place])    a. s/p DCCV 02/2016; b. sotalol and Eliquis; c. CHADS2VASc --> 4 (HTN, age x 2, female)  . Sinus node dysfunction [facility])      Patient Active Problem List   Diagnosis Date Noted  . Partial small bowel obstruction ([place]) 03/31/2021  . Acute abdominal pain 06/12/2018  . COPD (chronic obstructive pulmonary disease) ([place]) 10/17/2017  . Irritable bowel syndrome with diarrhea 10/17/2017  . Scalp lesion 10/17/2017  . Vision loss of left eye 08/21/2017  . Labile hypertension 11/07/2016  . Anxiety 11/07/2016  . Coronary artery disease, non-occlusive  08/16/2016  . Episodic atrial fibrillation ([place]) 08/16/2016  . Pacemaker 08/16/2016  . Essential hypertension 08/16/2016  . Dilatation of thoracic aorta ([place]) 08/16/2016  . HLD (hyperlipidemia) 08/16/2016  . Macular degeneration 08/16/2016  . Anticoagulant long-term use 03/24/2016  . AV block 03/24/2016  . Bilateral hearing loss 11/06/2014  . History of thyroid nodule 11/06/2014  . Nonexudative senile macular degeneration of retina 03/31/2013  . Pseudophakia 03/31/2013  . PVD (posterior vitreous detachment), both eyes 03/31/2013  . Generalized osteoarthritis of multiple sites 02/05/2004     Past Surgical History:  Procedure Laterality Date  . CHOLECYSTECTOMY    . knee replacmen    . PACEMAKER IMPLANT  2014  . thyroid nodule       Prior to Admission medications   Medication Sig Start Date End Date Taking? Authorizing Provider  albuterol (PROVENTIL HFA;VENTOLIN HFA) 108 (90 Base) MCG/ACT inhaler Inhale 2 puffs into the lungs every 6 (six) hours as needed for wheezing or shortness of breath. 10/17/17 06/13/19  Glean Hess, MD  apixaban (ELIQUIS) 5 MG TABS tablet Take 1 tablet (5 mg total) by mouth 2 (two) times daily. 07/25/17   Deboraha Sprang, MD  Calcium Carbonate-Vitamin D (CALCIUM-VITAMIN D3 PO) Take 1 each by mouth daily.    [provider]  cholecalciferol (VITAMIN D) 1000 units tablet Take 1,000 Units by mouth daily.    [provider]  cholestyramine (QUESTRAN) 4 g packet Take by mouth 2 (two) times daily.  11/13/16   [provider]  folic acid (FOLVITE) 1 MG  tablet Take 1 mg by mouth daily.    [provider]  hydrALAZINE (APRESOLINE) 10 MG tablet Take 20 mg by mouth 3 (three) times daily. 02/22/17   [provider]  HYDROcodone-acetaminophen (NORCO/VICODIN) 5-325 MG tablet take 1 tablet by mouth every 6 hours if needed for pain 10/31/16   [provider]  ipratropium (ATROVENT HFA) 17 MCG/ACT inhaler Inhale 2 puffs  into the lungs 4 (four) times daily. 02/11/18 02/11/19  [provider]  Lecithin 1200 MG CAPS Take by mouth.    [provider]  lisinopril (PRINIVIL,ZESTRIL) 40 MG tablet Take 1 tablet (40 mg) by mouth once daily as directed 06/01/18   [provider]  loperamide (IMODIUM) 2 MG capsule Take 2 mg by mouth as needed for diarrhea or loose stools.    [provider]  olopatadine (PATANOL) 0.1 % ophthalmic solution Place 1 drop into the right eye 2 (two) times daily.  11/07/16   [provider]  Omega-3 Fatty Acids (FISH OIL) 1200 MG CAPS Take by mouth.    [provider]  omeprazole (PRILOSEC) 20 MG capsule Take 20 mg by mouth daily as needed.     [provider]  psyllium (METAMUCIL) 58.6 % packet Take 1 packet by mouth daily as needed.    [provider]  sotalol (BETAPACE) 80 MG tablet Take 1 tablet (80 mg total) by mouth every 12 (twelve) hours. 12/31/19   Coniglio, Alcario Drought, MD  vitamin B-12 (CYANOCOBALAMIN) 1000 MCG tablet Take 1,000 mcg by mouth daily.    [provider]     Allergies Cephalexin, Diltiazem hcl, Hydrochlorothiazide, Prednisone, Felodipine, Magnesium-containing compounds, Other, Quinolones, Statins, Amlodipine, and Atenolol   Family History  Problem Relation Age of Onset  . Hypertension Mother   . Stroke Mother   . Hypertension Father     Social History Social History   Tobacco Use  . Smoking status: Former    Types: Cigarettes    Quit date: 11/15/1999    Years since quitting: 21.3  . Smokeless tobacco: Never  Vaping Use  . Vaping Use: Never used  Substance Use Topics  . Alcohol use: No  . Drug use: No    Review of Systems  Constitutional:   No fever or chills.  ENT:   No sore throat. No rhinorrhea. Cardiovascular:   No chest pain or syncope. Respiratory:   No dyspnea or cough. Gastrointestinal:   positive as above for abd pain and vomiting.  Musculoskeletal:   Negative for  focal pain or swelling All other systems reviewed and are negative except as documented above in ROS and HPI.  ____________________________________________   PHYSICAL EXAM:  VITAL SIGNS: ED Triage Vitals  Enc Vitals Group     BP 03/31/21 0711 (!) 156/73     Pulse Rate 03/31/21 0711 77     Resp 03/31/21 0711 17     Temp 03/31/21 0711 97.7 F (36.5 C)     Temp Source 03/31/21 0711 Oral     SpO2 03/31/21 0711 94 %     Weight 03/31/21 0712 158 lb 15.2 oz (72.1 kg)     Height 03/31/21 0712 5' 3"  (1.6 m)     Head Circumference --      Peak Flow --      Pain Score 03/31/21 0712 10     Pain Loc --      Pain Edu? --      Excl. in [place]? --  Vital signs reviewed, nursing assessments reviewed.   Constitutional:   Alert and oriented. Eyes:   Conjunctivae are normal. EOMI. PERRL. ENT      Head:   Normocephalic and atraumatic.      Nose:   normal      Mouth/Throat:   normal      Neck:   No meningismus. Full ROM. Hematological/Lymphatic/Immunilogical:   No cervical lymphadenopathy. Cardiovascular:   RRR. Symmetric bilateral radial and DP pulses.  No murmurs. Cap refill less than 2 seconds. Respiratory:   Normal respiratory effort without tachypnea/retractions. Breath sounds are clear and equal bilaterally. No wheezes/rales/rhonchi. Gastrointestinal:   Soft with diffuse tenderness. Moderately distended. No rebound, rigidity, or guarding.  Musculoskeletal:   Normal range of motion in all extremities. No joint effusions.  No lower extremity tenderness.  No edema. Neurologic:   Normal speech and language.  Motor grossly intact. No acute focal neurologic deficits are appreciated.  Skin:    Skin is warm, dry and intact. No rash noted.  No petechiae, purpura, or bullae.  ____________________________________________    LABS (pertinent positives/negatives) (all labs ordered are listed, but only abnormal results are displayed) Labs Reviewed  COMPREHENSIVE METABOLIC PANEL - Abnormal;  Notable for the following components:      Result Value   Sodium 134 (*)    Glucose, Bld 169 (*)    All other components within normal limits  CBC - Abnormal; Notable for the following components:   WBC 14.6 (*)    All other components within normal limits  RESP PANEL BY RT-PCR (FLU A&B, COVID) ARPGX2  LIPASE, BLOOD  URINALYSIS, ROUTINE W REFLEX MICROSCOPIC   ____________________________________________   EKG    ____________________________________________    RADIOLOGY  CT ABDOMEN PELVIS W CONTRAST  Result Date: 03/31/2021 CLINICAL DATA:  Acute generalized abdominal pain, nausea, vomiting. EXAM: CT ABDOMEN AND PELVIS WITH CONTRAST TECHNIQUE: Multidetector CT imaging of the abdomen and pelvis was performed using the standard protocol following bolus administration of intravenous contrast. CONTRAST:  18m OMNIPAQUE IOHEXOL 300 MG/ML  SOLN COMPARISON:  January 26, 2019. FINDINGS: Lower chest: Visualized lung bases are unremarkable. Moderate size sliding-type hiatal hernia is noted. Hepatobiliary: Status post cholecystectomy. Intrahepatic and extrahepatic biliary dilatation is noted which may be due to post cholecystectomy status. No focal liver abnormality is noted. Pancreas: Unremarkable. No pancreatic ductal dilatation or surrounding inflammatory changes. Spleen: Normal in size without focal abnormality. Adrenals/Urinary Tract: Adrenal glands appear normal. Bilateral renal cysts. No hydronephrosis or renal obstruction is noted. Urinary bladder is unremarkable. Stomach/Bowel: The stomach and appendix are unremarkable. There is noted moderate small bowel dilatation with transition zone seen in the distal ileum in the right lower quadrant of the pelvis, best seen on image number 58 of series 2. Potentially this may be due to adhesion or possible focal inflammation involving this portion of the ileum. No colonic dilatation is noted. Vascular/Lymphatic: Aortic atherosclerosis. No enlarged  abdominal or pelvic lymph nodes. Reproductive: Uterus and bilateral adnexa are unremarkable. Other: No abdominal wall hernia or abnormality. No abdominopelvic ascites. Musculoskeletal: No acute or significant osseous findings. IMPRESSION: Moderate small bowel dilatation is noted with transition zone seen in the right lower quadrant involving the distal ileum, concerning for partial small bowel obstruction. It is uncertain if this is due to adhesion or possibly mild inflammatory changes involving this portion of the ileum. Moderate size sliding-type hiatal hernia is noted. Aortic Atherosclerosis (ICD10-I70.0). Electronically Signed   By: JMarijo ConceptionM.D.   On: 03/31/2021  09:38    ____________________________________________   PROCEDURES Procedures  ____________________________________________  DIFFERENTIAL DIAGNOSIS   SBO, bowel perforation, Viral enteritis, gastritis, pancreatitis  CLINICAL IMPRESSION / ASSESSMENT AND PLAN / ED COURSE  Medications ordered in the ED: Medications  sodium chloride 0.9 % bolus 500 mL (0 mLs Intravenous Stopped 03/31/21 0906)  ondansetron (ZOFRAN) injection 4 mg (4 mg Intravenous Given 03/31/21 0820)  morphine 2 MG/ML injection 2 mg (2 mg Intravenous Given 03/31/21 0820)  iohexol (OMNIPAQUE) 300 MG/ML solution 100 mL (100 mLs Intravenous Contrast Given 03/31/21 0910)    Pertinent labs & imaging results that were available during my care of the patient were reviewed by me and considered in my medical decision making (see chart for details).  Chelsey Williams was evaluated in Emergency Department on 03/31/2021 for the symptoms described in the history of present illness. She was evaluated in the context of the global COVID-19 pandemic, which necessitated consideration that the patient might be at risk for infection with the SARS-CoV-2 virus that causes COVID-19. Institutional protocols and algorithms that pertain to the evaluation of patients at risk for COVID-19 are  in a state of rapid change based on information released by regulatory bodies including the CDC and federal and state organizations. These policies and algorithms were followed during the patient's care in the ED.   Pt p/w abd pain and vomiting. Exam shows distended, tender abdomen. Will obtain labs, give morphine 51m IV, IVF, zofran, and obtain CT a/p.   ----------------------------------------- 10:08 AM on 03/31/2021 ----------------------------------------- CT demonstrates small bowel obstruction with transition point in the distal ileum in the region of Crohn's disease.  Will need NG tube due to vomiting and bowel distention and gastric distention on CT.  Discussed with surgery Dr. CPeyton Najjarwho has no additional recommendations at this time.  Notified GI Dr. WAllen Norrisas well and discussed with hospitalist.  Clinical Course as of 03/31/21 1116  Thu Mar 31, 2021  1115 Pt refuses NGT. [PS]    Clinical Course User Index [PS] SCarrie Mew MD     ____________________________________________   FINAL CLINICAL IMPRESSION(S) / ED DIAGNOSES    Final diagnoses:  S[place] ([place] obstruction) [place]  Crohn's colitis, other complication ([facility]     ED Discharge Orders     None       Portions of this note were generated with dragon dictation software. Dictation errors may occur despite best attempts at proofreading.    SCarrie Mew MD 03/31/21 1009

## 2021-03-31 NOTE — ED Notes (Signed)
Other nurse attempted NG placement. Pt refused NG tube and stated her nasal septum does not accomodate NG tube placement.

## 2021-03-31 NOTE — ED Triage Notes (Signed)
EMS brings pt in from home for c/o N/V and mid abd pain since last night

## 2021-04-01 DIAGNOSIS — K5000 Crohn's disease of small intestine without complications: Secondary | ICD-10-CM

## 2021-04-01 DIAGNOSIS — K56600 Partial intestinal obstruction, unspecified as to cause: Secondary | ICD-10-CM | POA: Diagnosis not present

## 2021-04-01 DIAGNOSIS — K5090 Crohn's disease, unspecified, without complications: Secondary | ICD-10-CM

## 2021-04-01 LAB — CBC
HCT: 35.1 % — ABNORMAL LOW (ref 36.0–46.0)
Hemoglobin: 11.5 g/dL — ABNORMAL LOW (ref 12.0–15.0)
MCH: 29.5 pg (ref 26.0–34.0)
MCHC: 32.8 g/dL (ref 30.0–36.0)
MCV: 90 fL (ref 80.0–100.0)
Platelets: 270 10*3/uL (ref 150–400)
RBC: 3.9 MIL/uL (ref 3.87–5.11)
RDW: 14.3 % (ref 11.5–15.5)
WBC: 8.8 10*3/uL (ref 4.0–10.5)
nRBC: 0 % (ref 0.0–0.2)

## 2021-04-01 LAB — BASIC METABOLIC PANEL
Anion gap: 6 (ref 5–15)
BUN: 22 mg/dL (ref 8–23)
CO2: 24 mmol/L (ref 22–32)
Calcium: 8.1 mg/dL — ABNORMAL LOW (ref 8.9–10.3)
Chloride: 105 mmol/L (ref 98–111)
Creatinine, Ser: 0.66 mg/dL (ref 0.44–1.00)
GFR, Estimated: >60 mL/min (ref >60)
Glucose, Bld: 112 mg/dL — ABNORMAL HIGH (ref 70–99)
Potassium: 3.8 mmol/L (ref 3.5–5.1)
Sodium: 135 mmol/L (ref 135–145)

## 2021-04-01 LAB — VITAMIN B12: Vitamin B-12: 152 pg/mL — ABNORMAL LOW (ref 180–914)

## 2021-04-01 MED ORDER — POTASSIUM CHLORIDE CRYS ER 20 MEQ PO TBCR
40.0000 meq | EXTENDED_RELEASE_TABLET | Freq: Once | ORAL | Status: AC
  Administered 2021-04-01: 40 meq via ORAL
  Filled 2021-04-01: qty 2

## 2021-04-01 MED ORDER — APIXABAN 5 MG PO TABS
5.0000 mg | ORAL_TABLET | Freq: Two times a day (BID) | ORAL | Status: DC
  Administered 2021-04-01 – 2021-04-04 (×7): 5 mg via ORAL
  Filled 2021-04-01 (×7): qty 1

## 2021-04-01 MED ORDER — IPRATROPIUM BROMIDE 0.02 % IN SOLN: 2.5000 mL | Freq: Four times a day (QID) | RESPIRATORY_TRACT | Status: DC | PRN

## 2021-04-01 NOTE — Progress Notes (Signed)
Patient ID: Chelsey Williams, female   DOB: [birth year], 87 y.o.   MRN: [id number]     [place] [facility] Day(s): 1.   Interval History: Patient seen and examined, no acute events or new complaints overnight. Patient reports she is feeling better.  She reports that the burning on her stomach is improving.  She endorses passing gas.  She denies significant abdominal pain.  She feels that the steroids are improving.  Vital signs in last 24 hours: [min-max] current  Temp:  [98 F (36.7 C)-99.3 F (37.4 C)] 99.1 F (37.3 C) (12/09 1627) Pulse Rate:  [76-82] 76 (12/09 0802) Resp:  [16-20] 18 (12/09 1627) BP: (115-162)/(55-78) 117/78 (12/09 1627) SpO2:  [93 %-98 %] 93 % (12/09 1627)     Height: 5' 3"  (160 cm) Weight: 72.1 kg BMI (Calculated): 28.16   Physical Exam:  Constitutional: alert, cooperative and no distress  Respiratory: breathing non-labored at rest  Cardiovascular: regular rate and sinus rhythm  Gastrointestinal: soft, non-tender, and non-distended  Labs:  CBC Latest Ref Rng & Units 04/01/2021 03/31/2021 01/26/2019  WBC 4.0 - 10.5 K/uL 8.8 14.6(H) 14.2(H)  Hemoglobin 12.0 - 15.0 g/dL 11.5(L) 14.0 13.6  Hematocrit 36.0 - 46.0 % 35.1(L) 42.6 42.1  Platelets 150 - 400 K/uL 270 320 323   CMP Latest Ref Rng & Units 04/01/2021 03/31/2021 01/26/2019  Glucose 70 - 99 mg/dL 112(H) 169(H) 167(H)  BUN 8 - 23 mg/dL 22 20 22   Creatinine 0.44 - 1.00 mg/dL 0.66 0.54 0.60  Sodium 135 - 145 mmol/L 135 134(L) 139  Potassium 3.5 - 5.1 mmol/L 3.8 3.7 3.6  Chloride 98 - 111 mmol/L 105 100 106  CO2 22 - 32 mmol/L 24 26 24   Calcium 8.9 - 10.3 mg/dL 8.1(L) 9.1 8.8(L)  Total Protein 6.5 - 8.1 g/dL - 7.2 6.9  Total Bilirubin 0.3 - 1.2 mg/dL - 0.6 0.8  Alkaline Phos 38 - 126 U/L - 64 66  AST 15 - 41 U/L - 31 30  ALT 0 - 44 U/L - 17 19    Imaging studies: No new pertinent imaging studies   Assessment/Plan:  87 y.o. female with small bowel obstruction, complicated by pertinent  comorbidities including Crohn's disease, paroxysmal atrial fibrillation with pacemaker, hypertension.  Patient today doing better with medical therapy for acute flare of Crohn's disease.  She endorses passing gas.  She endorses that the abdominal bloating is improving.  She denies any nausea or vomiting today.  I think that this is a significant improvement compared to yesterday.  I agree with clear liquid diet.  Continue medical management as per gastroenterology recommendation and primary team.  No indication for surgery at this moment.  I will continue to follow.  Arnold Long, MD

## 2021-04-01 NOTE — Progress Notes (Signed)
Per Telemetry QTC is 0.43

## 2021-04-01 NOTE — Progress Notes (Signed)
Chelsey Darby, MD [address]  [place]  [place], [place] [address]  Main: [phone]  Fax: [phone] Pager: [phone]   Subjective: No acute events overnight.  Patient reports feeling significantly better today with regards to her abdominal pain.  She is having intermittent discrete sharp pains in her right lower quadrant.  She said she is finally able to pass gas.  She feels hungry.  She denies any nausea or vomiting   Objective: Vital signs in last 24 hours: Vitals:   03/31/21 1818 03/31/21 1942 04/01/21 0459 04/01/21 0802  BP: (!) 145/69 (!) 162/67 (!) 145/72 (!) 115/55  Pulse: 78 82 76 76  Resp: 18 16 20 18   Temp: 98 F (36.7 C) 98.2 F (36.8 C) 98.6 F (37 C) 99.3 F (37.4 C)  TempSrc: Oral  Oral Oral  SpO2: 95% 98% 93% 95%  Weight:      Height:       Weight change:   Intake/Output Summary (Last 24 hours) at 04/01/2021 1304 Last data filed at 04/01/2021 1245 Gross per 24 hour  Intake 240 ml  Output 850 ml  Net -610 ml     Exam: Heart:: Regular rate and rhythm, S1S2 present, or without murmur or extra heart sounds Lungs: normal and clear to auscultation Abdomen: soft, mild right lower quadrant tenderness, normal bowel sounds   Lab Results: CBC Latest Ref Rng & Units 04/01/2021 03/31/2021 01/26/2019  WBC 4.0 - 10.5 K/uL 8.8 14.6(H) 14.2(H)  Hemoglobin 12.0 - 15.0 g/dL 11.5(L) 14.0 13.6  Hematocrit 36.0 - 46.0 % 35.1(L) 42.6 42.1  Platelets 150 - 400 K/uL 270 320 323   CMP Latest Ref Rng & Units 04/01/2021 03/31/2021 01/26/2019  Glucose 70 - 99 mg/dL 112(H) 169(H) 167(H)  BUN 8 - 23 mg/dL 22 20 22   Creatinine 0.44 - 1.00 mg/dL 0.66 0.54 0.60  Sodium 135 - 145 mmol/L 135 134(L) 139  Potassium 3.5 - 5.1 mmol/L 3.8 3.7 3.6  Chloride 98 - 111 mmol/L 105 100 106  CO2 22 - 32 mmol/L 24 26 24   Calcium 8.9 - 10.3 mg/dL 8.1(L) 9.1 8.8(L)  Total Protein 6.5 - 8.1 g/dL - 7.2 6.9  Total Bilirubin 0.3 - 1.2 mg/dL - 0.6 0.8  Alkaline Phos 38 - 126  U/L - 64 66  AST 15 - 41 U/L - 31 30  ALT 0 - 44 U/L - 17 19    Micro Results: Recent Results (from the past 240 hour(s))  Resp Panel by RT-PCR (Flu A&B, Covid) Nasopharyngeal Swab     Status: None   Collection Time: 03/31/21  8:28 AM   Specimen: Nasopharyngeal Swab; Nasopharyngeal(NP) swabs in vial transport medium  Result Value Ref Range Status   SARS Coronavirus 2 by RT PCR NEGATIVE NEGATIVE Final    Comment: (NOTE) SARS-CoV-2 target nucleic acids are NOT DETECTED.  The SARS-CoV-2 RNA is generally detectable in upper respiratory specimens during the acute phase of infection. The lowest concentration of SARS-CoV-2 viral copies this assay can detect is 138 copies/mL. A negative result does not preclude SARS-Cov-2 infection and should not be used as the sole basis for treatment or other patient management decisions. A negative result may occur with  improper specimen collection/handling, submission of specimen other than nasopharyngeal swab, presence of viral mutation(s) within the areas targeted by this assay, and inadequate number of viral copies(<138 copies/mL). A negative result must be combined with clinical observations, patient history, and epidemiological information. The expected result is Negative.  Fact Sheet for Patients:  EntrepreneurPulse.com.au  Fact Sheet for Healthcare Providers:  IncredibleEmployment.be  This test is no t yet approved or cleared by the [place] FDA and  has been authorized for detection and/or diagnosis of SARS-CoV-2 by FDA under an Emergency Use Authorization (EUA). This EUA will remain  in effect (meaning this test can be used) for the duration of the COVID-19 declaration under Section 564(b)(1) of the Act, 21 U.S.C.section 360bbb-3(b)(1), unless the authorization is terminated  or revoked sooner.       Influenza A by PCR NEGATIVE NEGATIVE Final   Influenza B by PCR NEGATIVE NEGATIVE Final     Comment: (NOTE) The Xpert Xpress SARS-CoV-2/FLU/RSV plus assay is intended as an aid in the diagnosis of influenza from Nasopharyngeal swab specimens and should not be used as a sole basis for treatment. Nasal washings and aspirates are unacceptable for Xpert Xpress SARS-CoV-2/FLU/RSV testing.  Fact Sheet for Patients: EntrepreneurPulse.com.au  Fact Sheet for Healthcare Providers: IncredibleEmployment.be  This test is not yet approved or cleared by the [place] FDA and has been authorized for detection and/or diagnosis of SARS-CoV-2 by FDA under an Emergency Use Authorization (EUA). This EUA will remain in effect (meaning this test can be used) for the duration of the COVID-19 declaration under Section 564(b)(1) of the Act, 21 U.S.C. section 360bbb-3(b)(1), unless the authorization is terminated or revoked.  Performed at [facility], [address] [place], [place] [address]    Studies/Results: CT ABDOMEN PELVIS W CONTRAST  Result Date: 03/31/2021 CLINICAL DATA:  Acute generalized abdominal pain, nausea, vomiting. EXAM: CT ABDOMEN AND PELVIS WITH CONTRAST TECHNIQUE: Multidetector CT imaging of the abdomen and pelvis was performed using the standard protocol following bolus administration of intravenous contrast. CONTRAST:  164m OMNIPAQUE IOHEXOL 300 MG/ML  SOLN COMPARISON:  January 26, 2019. FINDINGS: Lower chest: Visualized lung bases are unremarkable. Moderate size sliding-type hiatal hernia is noted. Hepatobiliary: Status post cholecystectomy. Intrahepatic and extrahepatic biliary dilatation is noted which may be due to post cholecystectomy status. No focal liver abnormality is noted. Pancreas: Unremarkable. No pancreatic ductal dilatation or surrounding inflammatory changes. Spleen: Normal in size without focal abnormality. Adrenals/Urinary Tract: Adrenal glands appear normal. Bilateral renal cysts. No hydronephrosis or renal  obstruction is noted. Urinary bladder is unremarkable. Stomach/Bowel: The stomach and appendix are unremarkable. There is noted moderate small bowel dilatation with transition zone seen in the distal ileum in the right lower quadrant of the pelvis, best seen on image number 58 of series 2. Potentially this may be due to adhesion or possible focal inflammation involving this portion of the ileum. No colonic dilatation is noted. Vascular/Lymphatic: Aortic atherosclerosis. No enlarged abdominal or pelvic lymph nodes. Reproductive: Uterus and bilateral adnexa are unremarkable. Other: No abdominal wall hernia or abnormality. No abdominopelvic ascites. Musculoskeletal: No acute or significant osseous findings. IMPRESSION: Moderate small bowel dilatation is noted with transition zone seen in the right lower quadrant involving the distal ileum, concerning for partial small bowel obstruction. It is uncertain if this is due to adhesion or possibly mild inflammatory changes involving this portion of the ileum. Moderate size sliding-type hiatal hernia is noted. Aortic Atherosclerosis (ICD10-I70.0). Electronically Signed   By: JMarijo ConceptionM.D.   On: 03/31/2021 09:38   Medications: I have reviewed the patient's current medications. Prior to Admission:  Medications Prior to Admission  Medication Sig Dispense Refill Last Dose   Aflibercept (EYLEA) 2 MG/0.05ML SOLN Apply to eye.   Past Month   apixaban (  ELIQUIS) 5 MG TABS tablet Take 1 tablet (5 mg total) by mouth 2 (two) times daily. 90 tablet 3 03/30/2021 at 2100   calcium carbonate (TUMS - DOSED IN MG ELEMENTAL CALCIUM) 500 MG chewable tablet Chew 1 tablet by mouth every other day.   03/30/2021   Calcium Carbonate-Vitamin D (CALCIUM-VITAMIN D3 PO) Take 1 each by mouth daily.   03/30/2021 at AM   dofetilide (TIKOSYN) 250 MCG capsule Take 250 mcg by mouth 2 (two) times daily.   03/30/2021 at 2100   ferrous sulfate 325 (65 FE) MG tablet Take 1 tablet by mouth daily with  breakfast.   03/30/2021   Hyoscyamine Sulfate SL 0.125 MG SUBL Place 0.125 mg under the tongue every 4 (four) hours as needed.   03/30/2021   Lecithin 1200 MG CAPS Take by mouth.   03/30/2021   olopatadine (PATANOL) 0.1 % ophthalmic solution Place 1 drop into the right eye 2 (two) times daily.   0 Past Week at PRN   omeprazole (PRILOSEC) 20 MG capsule Take 20 mg by mouth daily as needed.    03/30/2021 at NIGHT   potassium chloride SA (KLOR-CON M) 20 MEQ tablet Take 20 mEq by mouth daily.   03/30/2021   Specialty Vitamins Products (MAGNESIUM, AMINO ACID CHELATE,) 133 MG tablet Take 2 tablets by mouth daily.   03/30/2021   acetaminophen (TYLENOL) 325 MG tablet Take 325 mg by mouth every 4 (four) hours as needed.   PRN at PRN   albuterol (PROVENTIL HFA;VENTOLIN HFA) 108 (90 Base) MCG/ACT inhaler Inhale 2 puffs into the lungs every 6 (six) hours as needed for wheezing or shortness of breath. 3 each 3    cholestyramine (QUESTRAN) 4 g packet Take by mouth 2 (two) times daily.   0    ipratropium (ATROVENT HFA) 17 MCG/ACT inhaler Inhale 2 puffs into the lungs 4 (four) times daily.      lisinopril (PRINIVIL,ZESTRIL) 40 MG tablet Take 1 tablet (40 mg) by mouth once daily as directed   PRN at PRN   loperamide (IMODIUM) 2 MG capsule Take 2 mg by mouth as needed for diarrhea or loose stools.   PRN at PRN   psyllium (METAMUCIL) 58.6 % packet Take 1 packet by mouth daily as needed.   PRN at PRN   Scheduled:  Aflibercept   Ophthalmic Daily   apixaban  5 mg Oral BID   dofetilide  250 mcg Oral BID   methylPREDNISolone (SOLU-MEDROL) injection  20 mg Intravenous Q12H   pantoprazole (PROTONIX) IV  40 mg Intravenous Daily   potassium chloride  40 mEq Oral Once   Continuous:  sodium chloride 75 mL/hr at 04/01/21 0452   BBC:[id number], ipratropium, morphine injection, ondansetron **OR** ondansetron (ZOFRAN) IV Anti-infectives (From admission, onward)    None      Scheduled Meds:  Aflibercept   Ophthalmic Daily    apixaban  5 mg Oral BID   dofetilide  250 mcg Oral BID   methylPREDNISolone (SOLU-MEDROL) injection  20 mg Intravenous Q12H   pantoprazole (PROTONIX) IV  40 mg Intravenous Daily   potassium chloride  40 mEq Oral Once   Continuous Infusions:  sodium chloride 75 mL/hr at 04/01/21 0452   PRN Meds:.hydrALAZINE, ipratropium, morphine injection, ondansetron **OR** ondansetron (ZOFRAN) IV   Assessment: Principal Problem:   Partial small bowel obstruction (HCC) Active Problems:   Coronary artery disease, non-occlusive   Episodic atrial fibrillation (HCC)   Bilateral hearing loss   Hiatal hernia with gastroesophageal reflux  Crohn's disease ([place])   Plan: Exacerbation of small bowel Crohn's presenting with partial small bowel obstruction: Not stricturing, nonpenetrating phenotype Clinically improving, passing flatus, abdomen is soft, mild right lower quadrant tenderness Continue Solu-Medrol 76m twice daily Clear liquid diet today Check vitamin B12 levels Discussed with patient regarding importance of a soft diet long-term Follow-up with [facility] upon discharge    LOS: 1 day   Chelsey Williams 04/01/2021, 1:04 PM

## 2021-04-01 NOTE — Progress Notes (Signed)
PROGRESS NOTE    Chelsey Williams  JGG:[id number] DOB: [birth year] DOA: 03/31/2021 PCP: Clarisse Gouge, MD  Outpatient Specialists: GI    Brief Narrative:   Chelsey Williams is a 87 y.o. female with medical history significant for ??  Crohn's disease, macular degeneration, GERD, atrial fibrillation, coronary artery disease, status post pacemaker insertion for sinus node dysfunction who presents to the emergency room via EMS for evaluation of 1 day history of nausea, vomiting and periumbilical abdominal pain which she rated 10 x 10 in intensity at its worst. Abdominal pain is mostly in the periumbilical area and is described as a burning sensation and is nonradiating and has been associated with nausea and multiple episodes of bilious emesis.  Last bowel movement was 1 day prior to admission and she states that she had loose stools.  She takes Imodium almost on a daily basis for diarrhea. She has had cholecystectomy done in the past as well as polypectomy. She denies having any fever, no chills, no cough, no headache, no dizziness, no lightheadedness, no palpitations, no diaphoresis, no urinary frequency, no nocturia, no dysuria, no lower extremity swelling. Chart review shows patient had an EGD and colonoscopy in March 2022 which showed multiple ulcers in the distal ileum with relative sparing of the terminal ileum and also a potential area of narrowing and stenosis in the distal ileum.  She was offered budesonide therapy which she declined due to concern for side effects.  She has a history of chronic diarrhea and takes Imodium daily as needed.   Assessment & Plan:   Principal Problem:   Partial small bowel obstruction (HCC) Active Problems:   Coronary artery disease, non-occlusive   Episodic atrial fibrillation (HCC)   Bilateral hearing loss   Hiatal hernia with gastroesophageal reflux   Crohn's disease ([place])  # Partial small bowel obstruction Likely 2/2 Crohn's flare. Abdominal pain much  improved, no nausea/vomiting since PM of 12/8. No stool since arrival but is passing flatus. Gen surg and GI following - maintain NPO for now. Patient refused NG tube on arrival. Gen surg advised IR consult for placement but doesn't appear they were consulted. As patient is clinically improving will hold off on that for now - advance diet per gen surg - treating crohn's flare as below - continue IVF for now  # Crohn's disease Recent diagnosis - GI following, has started methylpred. Patient has a hx of steroid-induced mood changes, will monitor for that  # A-fib Has pacemaker. Asymptomatic, rate is controlled - cont home tikosyn - check ekg to assess qtc - cont eliquis  # COPD Quiescent - home inhaler prn   DVT prophylaxis: eliquis Code Status: full Family Communication: son updated telephonically 12/9  Level of care: Telemetry Medical Status is: Inpatient  Remains inpatient appropriate because: severity of illness      Consultants:  Gi, gen surg  Procedures: none  Antimicrobials:  none    Subjective: Pain much improved. No n/v. No stool, is passing flatus.   Objective: Vitals:   03/31/21 1818 03/31/21 1942 04/01/21 0459 04/01/21 0802  BP: (!) 145/69 (!) 162/67 (!) 145/72 (!) 115/55  Pulse: 78 82 76 76  Resp: 18 16 20 18   Temp: 98 F (36.7 C) 98.2 F (36.8 C) 98.6 F (37 C) 99.3 F (37.4 C)  TempSrc: Oral  Oral Oral  SpO2: 95% 98% 93% 95%  Weight:      Height:        Intake/Output Summary (Last 24 hours) at  04/01/2021 0936 Last data filed at 04/01/2021 0500 Gross per 24 hour  Intake 240 ml  Output 400 ml  Net -160 ml   Filed Weights   03/31/21 0712  Weight: 72.1 kg    Examination:  General exam: Appears calm and comfortable  Respiratory system: Clear to auscultation. Respiratory effort normal. Cardiovascular system: mod systolic murmur Gastrointestinal system: Abdomen is nondistended, soft and tender epigastrum w/ deep palpation. No  organomegaly or masses felt. Decreased bowel sounds throughout Central nervous system: Alert and oriented. No focal neurological deficits. Extremities: Symmetric 5 x 5 power. Skin: No rashes, lesions or ulcers Psychiatry: Judgement and insight appear normal. Mood & affect appropriate.     Data Reviewed: I have personally reviewed following labs and imaging studies  CBC: Recent Labs  Lab 03/31/21 0715  WBC 14.6*  HGB 14.0  HCT 42.6  MCV 89.5  PLT 341   Basic Metabolic Panel: Recent Labs  Lab 03/31/21 0715  NA 134*  K 3.7  CL 100  CO2 26  GLUCOSE 169*  BUN 20  CREATININE 0.54  CALCIUM 9.1  MG 2.0   GFR: Estimated Creatinine Clearance: 43.6 mL/min (by C-G formula based on SCr of 0.54 mg/dL). Liver Function Tests: Recent Labs  Lab 03/31/21 0715  AST 31  ALT 17  ALKPHOS 64  BILITOT 0.6  PROT 7.2  ALBUMIN 3.8   Recent Labs  Lab 03/31/21 0715  LIPASE 35   No results for input(s): AMMONIA in the last 168 hours. Coagulation Profile: No results for input(s): INR, PROTIME in the last 168 hours. Cardiac Enzymes: No results for input(s): CKTOTAL, CKMB, CKMBINDEX, TROPONINI in the last 168 hours. BNP (last 3 results) No results for input(s): PROBNP in the last 8760 hours. HbA1C: No results for input(s): HGBA1C in the last 72 hours. CBG: No results for input(s): GLUCAP in the last 168 hours. Lipid Profile: No results for input(s): CHOL, HDL, LDLCALC, TRIG, CHOLHDL, LDLDIRECT in the last 72 hours. Thyroid Function Tests: No results for input(s): TSH, T4TOTAL, FREET4, T3FREE, THYROIDAB in the last 72 hours. Anemia Panel: No results for input(s): VITAMINB12, FOLATE, FERRITIN, TIBC, IRON, RETICCTPCT in the last 72 hours. Urine analysis:    Component Value Date/Time   COLORURINE YELLOW (A) 06/12/2018 1233   APPEARANCEUR CLEAR (A) 06/12/2018 1233   APPEARANCEUR Clear 04/03/2012 1431   LABSPEC >1.046 (H) 06/12/2018 1233   LABSPEC 1.005 04/03/2012 1431   PHURINE  5.0 06/12/2018 1233   GLUCOSEU NEGATIVE 06/12/2018 1233   GLUCOSEU Negative 04/03/2012 1431   HGBUR SMALL (A) 06/12/2018 1233   BILIRUBINUR NEGATIVE 06/12/2018 1233   BILIRUBINUR Negative 04/03/2012 1431   KETONESUR 20 (A) 06/12/2018 1233   PROTEINUR NEGATIVE 06/12/2018 1233   NITRITE NEGATIVE 06/12/2018 1233   LEUKOCYTESUR NEGATIVE 06/12/2018 1233   LEUKOCYTESUR Negative 04/03/2012 1431   Sepsis Labs: @LABRCNTIP (procalcitonin:4,lacticidven:4)  ) Recent Results (from the past 240 hour(s))  Resp Panel by RT-PCR (Flu A&B, Covid) Nasopharyngeal Swab     Status: None   Collection Time: 03/31/21  8:28 AM   Specimen: Nasopharyngeal Swab; Nasopharyngeal(NP) swabs in vial transport medium  Result Value Ref Range Status   SARS Coronavirus 2 by RT PCR NEGATIVE NEGATIVE Final    Comment: (NOTE) SARS-CoV-2 target nucleic acids are NOT DETECTED.  The SARS-CoV-2 RNA is generally detectable in upper respiratory specimens during the acute phase of infection. The lowest concentration of SARS-CoV-2 viral copies this assay can detect is 138 copies/mL. A negative result does not preclude SARS-Cov-2 infection  and should not be used as the sole basis for treatment or other patient management decisions. A negative result may occur with  improper specimen collection/handling, submission of specimen other than nasopharyngeal swab, presence of viral mutation(s) within the areas targeted by this assay, and inadequate number of viral copies(<138 copies/mL). A negative result must be combined with clinical observations, patient history, and epidemiological information. The expected result is Negative.  Fact Sheet for Patients:  EntrepreneurPulse.com.au  Fact Sheet for Healthcare Providers:  IncredibleEmployment.be  This test is no t yet approved or cleared by the [place] FDA and  has been authorized for detection and/or diagnosis of SARS-CoV-2 by FDA under an  Emergency Use Authorization (EUA). This EUA will remain  in effect (meaning this test can be used) for the duration of the COVID-19 declaration under Section 564(b)(1) of the Act, 21 U.S.C.section 360bbb-3(b)(1), unless the authorization is terminated  or revoked sooner.       Influenza A by PCR NEGATIVE NEGATIVE Final   Influenza B by PCR NEGATIVE NEGATIVE Final    Comment: (NOTE) The Xpert Xpress SARS-CoV-2/FLU/RSV plus assay is intended as an aid in the diagnosis of influenza from Nasopharyngeal swab specimens and should not be used as a sole basis for treatment. Nasal washings and aspirates are unacceptable for Xpert Xpress SARS-CoV-2/FLU/RSV testing.  Fact Sheet for Patients: EntrepreneurPulse.com.au  Fact Sheet for Healthcare Providers: IncredibleEmployment.be  This test is not yet approved or cleared by the [place] FDA and has been authorized for detection and/or diagnosis of SARS-CoV-2 by FDA under an Emergency Use Authorization (EUA). This EUA will remain in effect (meaning this test can be used) for the duration of the COVID-19 declaration under Section 564(b)(1) of the Act, 21 U.S.C. section 360bbb-3(b)(1), unless the authorization is terminated or revoked.  Performed at [facility], [address] [place], [place] [address]          Radiology Studies: CT ABDOMEN PELVIS W CONTRAST  Result Date: 03/31/2021 CLINICAL DATA:  Acute generalized abdominal pain, nausea, vomiting. EXAM: CT ABDOMEN AND PELVIS WITH CONTRAST TECHNIQUE: Multidetector CT imaging of the abdomen and pelvis was performed using the standard protocol following bolus administration of intravenous contrast. CONTRAST:  16m OMNIPAQUE IOHEXOL 300 MG/ML  SOLN COMPARISON:  January 26, 2019. FINDINGS: Lower chest: Visualized lung bases are unremarkable. Moderate size sliding-type hiatal hernia is noted. Hepatobiliary: Status post cholecystectomy.  Intrahepatic and extrahepatic biliary dilatation is noted which may be due to post cholecystectomy status. No focal liver abnormality is noted. Pancreas: Unremarkable. No pancreatic ductal dilatation or surrounding inflammatory changes. Spleen: Normal in size without focal abnormality. Adrenals/Urinary Tract: Adrenal glands appear normal. Bilateral renal cysts. No hydronephrosis or renal obstruction is noted. Urinary bladder is unremarkable. Stomach/Bowel: The stomach and appendix are unremarkable. There is noted moderate small bowel dilatation with transition zone seen in the distal ileum in the right lower quadrant of the pelvis, best seen on image number 58 of series 2. Potentially this may be due to adhesion or possible focal inflammation involving this portion of the ileum. No colonic dilatation is noted. Vascular/Lymphatic: Aortic atherosclerosis. No enlarged abdominal or pelvic lymph nodes. Reproductive: Uterus and bilateral adnexa are unremarkable. Other: No abdominal wall hernia or abnormality. No abdominopelvic ascites. Musculoskeletal: No acute or significant osseous findings. IMPRESSION: Moderate small bowel dilatation is noted with transition zone seen in the right lower quadrant involving the distal ileum, concerning for partial small bowel obstruction. It is uncertain if this is due to adhesion  or possibly mild inflammatory changes involving this portion of the ileum. Moderate size sliding-type hiatal hernia is noted. Aortic Atherosclerosis (ICD10-I70.0). Electronically Signed   By: Marijo Conception M.D.   On: 03/31/2021 09:38        Scheduled Meds:  Aflibercept   Ophthalmic Daily   dofetilide  250 mcg Oral BID   methylPREDNISolone (SOLU-MEDROL) injection  20 mg Intravenous Q12H   pantoprazole (PROTONIX) IV  40 mg Intravenous Daily   potassium chloride  40 mEq Oral Once   Continuous Infusions:  sodium chloride 75 mL/hr at 04/01/21 0452     LOS: 1 day    Time spent: 66  min    Desma Maxim, MD [facility]   If 7PM-7AM, please contact night-coverage [email or web address] Password [facility] 04/01/2021, 9:36 AM

## 2021-04-01 NOTE — Evaluation (Signed)
Physical Therapy Evaluation Patient Details Name: Chelsey Williams MRN: [id number] DOB: [birth year] Today's Date: 04/01/2021  History of Present Illness  Chelsey Williams is a 66yoF who comes to [facility] 12/8 c 1d N/V and periumbilical pain. PMH: Crohn's disease, macular degeneration, GERD, atrial fibrillation, coronary artery disease, status post pacemaker insertion for sinus node dysfunction. Pt admitted with partial SBO.  Clinical Impression  Pt admitted with above diagnosis. Pt currently with functional limitations due to the deficits listed below (see "PT Problem List"). Patient agreeable to PT evaluation. Patient provides detailed description of PLOF and home environment. Pt eager to OOB, motivated to be up walking, does 4 laps around unit at a face pace. HR stable with activity, but post activity is sustaining in 140s bpm. Pt denies any acute impairment. Pt declines any need for home services at DC. RN aware. Patient's assessment this date reveals the patient requires an additional person present for safety and/or physical assistance to complete their typical ADL. At baseline, the patient is able to perform ADL with modified independence. Patient will benefit from skilled PT intervention to maximize independence and safety in mobility required for basic ADL performance at discharge.       Recommendations for follow up therapy are one component of a multi-disciplinary discharge planning process, led by the attending physician.  Recommendations may be updated based on patient status, additional functional criteria and insurance authorization.  Follow Up Recommendations No PT follow up    Assistance Recommended at Discharge PRN  Functional Status Assessment Patient has had a recent decline in their functional status and demonstrates the ability to make significant improvements in function in a reasonable and predictable amount of time.  Equipment Recommendations  None recommended by PT    Recommendations  for Other Services       Precautions / Restrictions Precautions Precautions: Fall Restrictions Weight Bearing Restrictions: No      Mobility  Bed Mobility Overal bed mobility: Modified Independent                  Transfers Overall transfer level: Modified independent Equipment used: Rolling walker (2 wheels)                    Ambulation/Gait Ambulation/Gait assistance: Min guard Gait Distance (Feet): 690 Feet Assistive device: Rolling walker (2 wheels) Gait Pattern/deviations: WFL(Within Functional Limits) Gait velocity: 0.48ms        Stairs            Wheelchair Mobility    Modified Rankin (Stroke Patients Only)       Balance Overall balance assessment: Mild deficits observed, not formally tested                                           Pertinent Vitals/Pain Pain Assessment: No/denies pain    Home Living Family/patient expects to be discharged to:: Private residence Living Arrangements: Children (Son works at [employer]) Available Help at Discharge: Family Type of Home: House Home Access: Stairs to enter   ETechnical brewerof Steps: 11   Home Layout: One level Home Equipment: RConservation officer, nature(2 wheels);Rollator (4 wheels)      Prior Function Prior Level of Function : Independent/Modified Independent                     Hand Dominance        Extremity/Trunk Assessment  Upper Extremity Assessment Upper Extremity Assessment: Overall WFL for tasks assessed    Lower Extremity Assessment Lower Extremity Assessment: Overall WFL for tasks assessed       Communication      Cognition Arousal/Alertness: Awake/alert Behavior During Therapy: WFL for tasks assessed/performed Overall Cognitive Status: Within Functional Limits for tasks assessed                                          General Comments      Exercises     Assessment/Plan    PT Assessment Patient needs  continued PT services  PT Problem List Decreased strength;Decreased cognition;Decreased range of motion;Decreased activity tolerance;Decreased balance;Decreased mobility;Decreased coordination;Decreased safety awareness;Cardiopulmonary status limiting activity       PT Treatment Interventions DME instruction;Balance training;Gait training;Stair training;Functional mobility training;Therapeutic activities;Therapeutic exercise;Patient/family education    PT Goals (Current goals can be found in the Care Plan section)  Acute Rehab PT Goals Patient Stated Goal: get out of bed, walk a bit, sit up in recliner PT Goal Formulation: With patient Time For Goal Achievement: 04/15/21 Potential to Achieve Goals: Good    Frequency Min 2X/week   Barriers to discharge        Co-evaluation               AM-PAC PT "6 Clicks" Mobility  Outcome Measure Help needed turning from your back to your side while in a flat bed without using bedrails?: None Help needed moving from lying on your back to sitting on the side of a flat bed without using bedrails?: None Help needed moving to and from a bed to a chair (including a wheelchair)?: A Little Help needed standing up from a chair using your arms (e.g., wheelchair or bedside chair)?: A Little Help needed to walk in hospital room?: A Little Help needed climbing 3-5 steps with a railing? : A Little 6 Click Score: 20    End of Session Equipment Utilized During Treatment: Gait belt Activity Tolerance: Patient tolerated treatment well;Treatment limited secondary to medical complications (Comment) (during AMB HR at 126bpm; post AMB, NA assisting with purewick donning, noted HR sustaining at 142bpm. RN aware) Patient left: in chair;with call bell/phone within reach;with nursing/sitter in room Nurse Communication: Mobility status PT Visit Diagnosis: Unsteadiness on feet (R26.81);Difficulty in walking, not elsewhere classified (R26.2);Muscle weakness  (generalized) (M62.81)    Time: 2947-6546 PT Time Calculation (min) (ACUTE ONLY): 31 min   Charges:   PT Evaluation $PT Eval Moderate Complexity: 1 Mod PT Treatments $Therapeutic Exercise: 8-22 mins       4:12 PM, 04/01/21 Etta Grandchild, PT, DPT Physical Therapist - [facility]  [phone] ([place])    Josemiguel Gries C 04/01/2021, 4:09 PM

## 2021-04-01 NOTE — Plan of Care (Signed)
  Problem: Education: Goal: Knowledge of General Education information will improve Description: Including pain rating scale, medication(s)/side effects and non-pharmacologic comfort measures Outcome: Completed/Met   Problem: Health Behavior/Discharge Planning: Goal: Ability to manage health-related needs will improve Outcome: Progressing   Problem: Clinical Measurements: Goal: Ability to maintain clinical measurements within normal limits will improve Outcome: Progressing Goal: Will remain free from infection Outcome: Progressing Goal: Diagnostic test results will improve Outcome: Progressing Goal: Respiratory complications will improve Outcome: Progressing Goal: Cardiovascular complication will be avoided Outcome: Completed/Met   Problem: Clinical Measurements: Goal: Will remain free from infection Outcome: Progressing   Problem: Clinical Measurements: Goal: Diagnostic test results will improve Outcome: Progressing   Problem: Clinical Measurements: Goal: Respiratory complications will improve Outcome: Progressing   Problem: Clinical Measurements: Goal: Cardiovascular complication will be avoided Outcome: Completed/Met   Problem: Nutrition: Goal: Adequate nutrition will be maintained Outcome: Progressing   Problem: Coping: Goal: Level of anxiety will decrease Outcome: Completed/Met   Problem: Elimination: Goal: Will not experience complications related to bowel motility Outcome: Completed/Met Goal: Will not experience complications related to urinary retention Outcome: Completed/Met   Problem: Pain Managment: Goal: General experience of comfort will improve Outcome: Completed/Met   Problem: Safety: Goal: Ability to remain free from injury will improve Outcome: Completed/Met   Problem: Skin Integrity: Goal: Risk for impaired skin integrity will decrease Outcome: Not Met (add Reason)

## 2021-04-01 NOTE — Progress Notes (Signed)
Pharmacy: Dofetilide (Tikosyn) - Follow Up Assessment and Electrolyte Replacement  Pharmacy consulted to assist in monitoring and replacing electrolytes in this 87 y.o. female admitted on 03/31/2021 undergoing dofetilide re-initiation. First dofetilide dose: 03/31/21 2349  Labs:    Component Value Date/Time   K 3.8 04/01/2021 1058   MG 2.0 03/31/2021 0715     Plan:  Potassium: K 3.8-3.9:  Give KCl 40 mEq po x1   Magnesium: Mg > 2: No additional supplementation needed   Thank you for allowing pharmacy to participate in this patient's care   Dallie Piles 04/01/2021  1:22 PM

## 2021-04-02 DIAGNOSIS — K5000 Crohn's disease of small intestine without complications: Secondary | ICD-10-CM | POA: Diagnosis not present

## 2021-04-02 DIAGNOSIS — K56600 Partial intestinal obstruction, unspecified as to cause: Secondary | ICD-10-CM | POA: Diagnosis not present

## 2021-04-02 DIAGNOSIS — E538 Deficiency of other specified B group vitamins: Secondary | ICD-10-CM

## 2021-04-02 LAB — BASIC METABOLIC PANEL
Anion gap: 7 (ref 5–15)
BUN: 17 mg/dL (ref 8–23)
CO2: 25 mmol/L (ref 22–32)
Calcium: 8.4 mg/dL — ABNORMAL LOW (ref 8.9–10.3)
Chloride: 108 mmol/L (ref 98–111)
Creatinine, Ser: 0.49 mg/dL (ref 0.44–1.00)
GFR, Estimated: >60 mL/min (ref >60)
Glucose, Bld: 136 mg/dL — ABNORMAL HIGH (ref 70–99)
Potassium: 3.8 mmol/L (ref 3.5–5.1)
Sodium: 140 mmol/L (ref 135–145)

## 2021-04-02 LAB — CBC
HCT: 35.4 % — ABNORMAL LOW (ref 36.0–46.0)
Hemoglobin: 11.3 g/dL — ABNORMAL LOW (ref 12.0–15.0)
MCH: 28.5 pg (ref 26.0–34.0)
MCHC: 31.9 g/dL (ref 30.0–36.0)
MCV: 89.2 fL (ref 80.0–100.0)
Platelets: 292 10*3/uL (ref 150–400)
RBC: 3.97 MIL/uL (ref 3.87–5.11)
RDW: 14.1 % (ref 11.5–15.5)
WBC: 9.8 10*3/uL (ref 4.0–10.5)
nRBC: 0 % (ref 0.0–0.2)

## 2021-04-02 LAB — MAGNESIUM: Magnesium: 2.2 mg/dL (ref 1.7–2.4)

## 2021-04-02 MED ORDER — BUDESONIDE 3 MG PO CPEP
9.0000 mg | ORAL_CAPSULE | Freq: Every day | ORAL | Status: DC
  Administered 2021-04-02 – 2021-04-04 (×3): 9 mg via ORAL
  Filled 2021-04-02 (×3): qty 3

## 2021-04-02 MED ORDER — RIFAXIMIN 550 MG PO TABS
550.0000 mg | ORAL_TABLET | Freq: Two times a day (BID) | ORAL | Status: DC
  Administered 2021-04-02 – 2021-04-04 (×5): 550 mg via ORAL
  Filled 2021-04-02 (×7): qty 1

## 2021-04-02 MED ORDER — METHYLPREDNISOLONE SODIUM SUCC 40 MG IJ SOLR: 20.0000 mg | Freq: Every day | INTRAMUSCULAR | Status: DC

## 2021-04-02 MED ORDER — PANTOPRAZOLE SODIUM 40 MG PO TBEC
40.0000 mg | DELAYED_RELEASE_TABLET | Freq: Every day | ORAL | Status: DC
  Administered 2021-04-03 – 2021-04-04 (×2): 40 mg via ORAL
  Filled 2021-04-02 (×2): qty 1

## 2021-04-02 MED ORDER — VITAMIN B-12 1000 MCG PO TABS
1000.0000 ug | ORAL_TABLET | Freq: Every day | ORAL | Status: DC
  Administered 2021-04-02 – 2021-04-04 (×3): 1000 ug via ORAL
  Filled 2021-04-02 (×3): qty 1

## 2021-04-02 MED ORDER — POTASSIUM CHLORIDE CRYS ER 20 MEQ PO TBCR
40.0000 meq | EXTENDED_RELEASE_TABLET | Freq: Once | ORAL | Status: AC
Start: 2021-04-02 — End: 2021-04-02
  Administered 2021-04-02: 40 meq via ORAL
  Filled 2021-04-02: qty 2

## 2021-04-02 NOTE — Progress Notes (Signed)
Patient ID: Laureen Frederic, female   DOB: 1927/08/19, 87 y.o.   MRN: [id number]     [place] [facility] Day(s): 2.   Interval History: Patient seen and examined, no acute events or new complaints overnight. Patient reports feeling well from the abdomen. Concern of hallucination this morning. Son at bedside. Endorses passing flatus and no abdominal pain or nausea.   Vital signs in last 24 hours: [min-max] current  Temp:  [97.7 F (36.5 C)-99.1 F (37.3 C)] 97.7 F (36.5 C) (12/10 0735) Pulse Rate:  [74-82] 75 (12/10 0735) Resp:  [12-20] 12 (12/10 0735) BP: (117-158)/(78-92) 143/83 (12/10 0735) SpO2:  [93 %-97 %] 95 % (12/10 0735)     Height: 5' 3"  (160 cm) Weight: 72.1 kg BMI (Calculated): 28.16   Physical Exam:  Constitutional: alert, cooperative and no distress  Respiratory: breathing non-labored at rest  Cardiovascular: regular rate and sinus rhythm  Gastrointestinal: soft, non-tender, and non-distended  Labs:  CBC Latest Ref Rng & Units 04/02/2021 04/01/2021 03/31/2021  WBC 4.0 - 10.5 K/uL 9.8 8.8 14.6(H)  Hemoglobin 12.0 - 15.0 g/dL 11.3(L) 11.5(L) 14.0  Hematocrit 36.0 - 46.0 % 35.4(L) 35.1(L) 42.6  Platelets 150 - 400 K/uL 292 270 320   CMP Latest Ref Rng & Units 04/02/2021 04/01/2021 03/31/2021  Glucose 70 - 99 mg/dL 136(H) 112(H) 169(H)  BUN 8 - 23 mg/dL 17 22 20   Creatinine 0.44 - 1.00 mg/dL 0.49 0.66 0.54  Sodium 135 - 145 mmol/L 140 135 134(L)  Potassium 3.5 - 5.1 mmol/L 3.8 3.8 3.7  Chloride 98 - 111 mmol/L 108 105 100  CO2 22 - 32 mmol/L 25 24 26   Calcium 8.9 - 10.3 mg/dL 8.4(L) 8.1(L) 9.1  Total Protein 6.5 - 8.1 g/dL - - 7.2  Total Bilirubin 0.3 - 1.2 mg/dL - - 0.6  Alkaline Phos 38 - 126 U/L - - 64  AST 15 - 41 U/L - - 31  ALT 0 - 44 U/L - - 17    Imaging studies: No new pertinent imaging studies   Assessment/Plan:  87 y.o. female with small bowel obstruction, complicated by pertinent comorbidities including Crohn's disease, paroxysmal atrial  fibrillation with pacemaker, hypertension.  Patient continue to improve with medical management. Continue passing gas, abdomen is soft and depressible without distention. No pain. Tolerated clear liquid diet and now advanced to full liquid. Currently on medical management for Crohn's exacerbation.   At this point since patient is responding well to medical therapy, surgical management is unlikely. May contact surgery if there is any change on clinical status for re evaluation.   Discussed with son at bedside who appreciated the care.   Arnold Long, MD

## 2021-04-02 NOTE — Progress Notes (Signed)
Pharmacy: Dofetilide (Tikosyn) - Follow Up Assessment and Electrolyte Replacement  Pharmacy consulted to assist in monitoring and replacing electrolytes in this 87 y.o. female admitted on 03/31/2021 undergoing dofetilide re-initiation. First dofetilide dose: 03/31/21 2349  Labs:    Component Value Date/Time   K 3.8 04/02/2021 0637   MG 2.2 04/02/2021 5521     Plan:  Potassium: K 3.8-3.9:  Give KCl 40 mEq po x1   Magnesium: Mg > 2: No additional supplementation needed   Thank you for allowing pharmacy to participate in this patient's care   [facility] A Everette Dimauro 04/02/2021  8:00 AM

## 2021-04-02 NOTE — Progress Notes (Signed)
Per Telemetry QTC is 0.43

## 2021-04-02 NOTE — Progress Notes (Signed)
Cephas Darby, MD [address]  [place]  [place],  [address]  Main: [phone]  Fax: [phone] Pager: [phone]   Subjective: Patient was upset this morning, call his son around 6 AM as she saw a snake crawling on the ceiling.  She refused to take Solu-Medrol this morning as she is concerned about steroid-induced psychosis.  Her son was bedside when I saw the patient.  Patient reports that whenever she is upset, she experiences abdominal pain, she states it is her nerves.  She has been tolerating liquid diet.  She wants to get out of bed and walk.  She took 4 laps on the floor yesterday.  She denies any nausea or vomiting.  She also told me that one of the doses of Solu-Medrol was infiltrated in her right forearm.   Objective: Vital signs in last 24 hours: Vitals:   04/01/21 1627 04/01/21 1947 04/02/21 0414 04/02/21 0735  BP: 117/78 (!) 158/80 (!) 156/92 (!) 143/83  Pulse:  74 82 75  Resp: 18 20 20 12   Temp: 99.1 F (37.3 C) 98.1 F (36.7 C) 98.4 F (36.9 C) 97.7 F (36.5 C)  TempSrc: Oral  Oral Oral  SpO2: 93% 97% 94% 95%  Weight:      Height:       Weight change:   Intake/Output Summary (Last 24 hours) at 04/02/2021 1138 Last data filed at 04/02/2021 1007 Gross per 24 hour  Intake 3320.21 ml  Output 2200 ml  Net 1120.21 ml     Exam: Heart:: Regular rate and rhythm, S1S2 present, or without murmur or extra heart sounds Lungs: normal and clear to auscultation Abdomen: soft, nontender, normal bowel sounds   Lab Results: CBC Latest Ref Rng & Units 04/02/2021 04/01/2021 03/31/2021  WBC 4.0 - 10.5 K/uL 9.8 8.8 14.6(H)  Hemoglobin 12.0 - 15.0 g/dL 11.3(L) 11.5(L) 14.0  Hematocrit 36.0 - 46.0 % 35.4(L) 35.1(L) 42.6  Platelets 150 - 400 K/uL 292 270 320   CMP Latest Ref Rng & Units 04/02/2021 04/01/2021 03/31/2021  Glucose 70 - 99 mg/dL 136(H) 112(H) 169(H)  BUN 8 - 23 mg/dL 17 22 20   Creatinine 0.44 - 1.00 mg/dL 0.49 0.66 0.54  Sodium 135 -  145 mmol/L 140 135 134(L)  Potassium 3.5 - 5.1 mmol/L 3.8 3.8 3.7  Chloride 98 - 111 mmol/L 108 105 100  CO2 22 - 32 mmol/L 25 24 26   Calcium 8.9 - 10.3 mg/dL 8.4(L) 8.1(L) 9.1  Total Protein 6.5 - 8.1 g/dL - - 7.2  Total Bilirubin 0.3 - 1.2 mg/dL - - 0.6  Alkaline Phos 38 - 126 U/L - - 64  AST 15 - 41 U/L - - 31  ALT 0 - 44 U/L - - 17    Micro Results: Recent Results (from the past 240 hour(s))  Resp Panel by RT-PCR (Flu A&B, Covid) Nasopharyngeal Swab     Status: None   Collection Time: 03/31/21  8:28 AM   Specimen: Nasopharyngeal Swab; Nasopharyngeal(NP) swabs in vial transport medium  Result Value Ref Range Status   SARS Coronavirus 2 by RT PCR NEGATIVE NEGATIVE Final    Comment: (NOTE) SARS-CoV-2 target nucleic acids are NOT DETECTED.  The SARS-CoV-2 RNA is generally detectable in upper respiratory specimens during the acute phase of infection. The lowest concentration of SARS-CoV-2 viral copies this assay can detect is 138 copies/mL. A negative result does not preclude SARS-Cov-2 infection and should not be used as the sole basis for treatment or  other patient management decisions. A negative result may occur with  improper specimen collection/handling, submission of specimen other than nasopharyngeal swab, presence of viral mutation(s) within the areas targeted by this assay, and inadequate number of viral copies(<138 copies/mL). A negative result must be combined with clinical observations, patient history, and epidemiological information. The expected result is Negative.  Fact Sheet for Patients:  EntrepreneurPulse.com.au  Fact Sheet for Healthcare Providers:  IncredibleEmployment.be  This test is no t yet approved or cleared by the [place] FDA and  has been authorized for detection and/or diagnosis of SARS-CoV-2 by FDA under an Emergency Use Authorization (EUA). This EUA will remain  in effect (meaning this test can be  used) for the duration of the COVID-19 declaration under Section 564(b)(1) of the Act, 21 U.S.C.section 360bbb-3(b)(1), unless the authorization is terminated  or revoked sooner.       Influenza A by PCR NEGATIVE NEGATIVE Final   Influenza B by PCR NEGATIVE NEGATIVE Final    Comment: (NOTE) The Xpert Xpress SARS-CoV-2/FLU/RSV plus assay is intended as an aid in the diagnosis of influenza from Nasopharyngeal swab specimens and should not be used as a sole basis for treatment. Nasal washings and aspirates are unacceptable for Xpert Xpress SARS-CoV-2/FLU/RSV testing.  Fact Sheet for Patients: EntrepreneurPulse.com.au  Fact Sheet for Healthcare Providers: IncredibleEmployment.be  This test is not yet approved or cleared by the [place] FDA and has been authorized for detection and/or diagnosis of SARS-CoV-2 by FDA under an Emergency Use Authorization (EUA). This EUA will remain in effect (meaning this test can be used) for the duration of the COVID-19 declaration under Section 564(b)(1) of the Act, 21 U.S.C. section 360bbb-3(b)(1), unless the authorization is terminated or revoked.  Performed at [facility], [address], [place], [place] [address]    Studies/Results: No results found. Medications: I have reviewed the patient's current medications. Prior to Admission:  Medications Prior to Admission  Medication Sig Dispense Refill Last Dose   Aflibercept (EYLEA) 2 MG/0.05ML SOLN Apply to eye.   Past Month   apixaban (ELIQUIS) 5 MG TABS tablet Take 1 tablet (5 mg total) by mouth 2 (two) times daily. 90 tablet 3 03/30/2021 at 2100   calcium carbonate (TUMS - DOSED IN MG ELEMENTAL CALCIUM) 500 MG chewable tablet Chew 1 tablet by mouth every other day.   03/30/2021   Calcium Carbonate-Vitamin D (CALCIUM-VITAMIN D3 PO) Take 1 each by mouth daily.   03/30/2021 at AM   dofetilide (TIKOSYN) 250 MCG capsule Take 250 mcg by mouth 2 (two)  times daily.   03/30/2021 at 2100   ferrous sulfate 325 (65 FE) MG tablet Take 1 tablet by mouth daily with breakfast.   03/30/2021   Hyoscyamine Sulfate SL 0.125 MG SUBL Place 0.125 mg under the tongue every 4 (four) hours as needed.   03/30/2021   Lecithin 1200 MG CAPS Take by mouth.   03/30/2021   olopatadine (PATANOL) 0.1 % ophthalmic solution Place 1 drop into the right eye 2 (two) times daily.   0 Past Week at PRN   omeprazole (PRILOSEC) 20 MG capsule Take 20 mg by mouth daily as needed.    03/30/2021 at NIGHT   potassium chloride SA (KLOR-CON M) 20 MEQ tablet Take 20 mEq by mouth daily.   03/30/2021   Specialty Vitamins Products (MAGNESIUM, AMINO ACID CHELATE,) 133 MG tablet Take 2 tablets by mouth daily.   03/30/2021   acetaminophen (TYLENOL) 325 MG tablet Take 325 mg by mouth  every 4 (four) hours as needed.   PRN at PRN   albuterol (PROVENTIL HFA;VENTOLIN HFA) 108 (90 Base) MCG/ACT inhaler Inhale 2 puffs into the lungs every 6 (six) hours as needed for wheezing or shortness of breath. 3 each 3    cholestyramine (QUESTRAN) 4 g packet Take by mouth 2 (two) times daily.   0    ipratropium (ATROVENT HFA) 17 MCG/ACT inhaler Inhale 2 puffs into the lungs 4 (four) times daily.      lisinopril (PRINIVIL,ZESTRIL) 40 MG tablet Take 1 tablet (40 mg) by mouth once daily as directed   PRN at PRN   loperamide (IMODIUM) 2 MG capsule Take 2 mg by mouth as needed for diarrhea or loose stools.   PRN at PRN   psyllium (METAMUCIL) 58.6 % packet Take 1 packet by mouth daily as needed.   PRN at PRN   Scheduled:  Aflibercept   Ophthalmic Daily   apixaban  5 mg Oral BID   budesonide  9 mg Oral Daily   dofetilide  250 mcg Oral BID   pantoprazole (PROTONIX) IV  40 mg Intravenous Daily   rifaximin  550 mg Oral BID   vitamin B-12  1,000 mcg Oral Daily   Continuous:  sodium chloride 75 mL/hr at 04/02/21 0513   HUT:[id number], ipratropium, morphine injection, ondansetron **OR** ondansetron (ZOFRAN)  IV Anti-infectives (From admission, onward)    Start     Dose/Rate Route Frequency Ordered Stop   04/02/21 1145  rifaximin (XIFAXAN) tablet 550 mg        550 mg Oral 2 times daily 04/02/21 1046        Scheduled Meds:  Aflibercept   Ophthalmic Daily   apixaban  5 mg Oral BID   budesonide  9 mg Oral Daily   dofetilide  250 mcg Oral BID   pantoprazole (PROTONIX) IV  40 mg Intravenous Daily   rifaximin  550 mg Oral BID   vitamin B-12  1,000 mcg Oral Daily   Continuous Infusions:  sodium chloride 75 mL/hr at 04/02/21 0513   PRN Meds:.hydrALAZINE, ipratropium, morphine injection, ondansetron **OR** ondansetron (ZOFRAN) IV   Assessment: Principal Problem:   Partial small bowel obstruction (HCC) Active Problems:   Coronary artery disease, non-occlusive   Episodic atrial fibrillation (HCC)   Bilateral hearing loss   Hiatal hernia with gastroesophageal reflux   Crohn's disease ([place])  Plan: Exacerbation of small bowel Crohn's presenting with partial small bowel obstruction: Non stricturing, nonpenetrating phenotype Clinically improving, passing flatus, abdomen is soft, mild right lower quadrant tenderness Switch from Solu-Medrol to budesonide 3 mg 3 pills daily due to patient's concern for steroid-induced psychosis and she experienced hallucinations this morning.  We will monitor her if she is able to tolerate budesonide in order to make an informed decision if she can be discharged on this medication Discussed with patient's son regarding trial of Xifaxan 550 mg p.o. twice daily for 2 weeks Advance to full liquid diet today Has vitamin B-12 deficiency, recommend to start B12 1000MCG daily Discussed with patient regarding importance of a soft diet long-term Follow-up with [facility] upon discharge   LOS: 2 days   Tomeka Kantner 04/02/2021, 11:38 AM

## 2021-04-02 NOTE — Progress Notes (Addendum)
PROGRESS NOTE    Chelsey Williams  EXB:[id number] DOB: [birth year] DOA: 03/31/2021 PCP: Clarisse Gouge, MD  Outpatient Specialists: GI    Brief Narrative:   Chelsey Williams is a 87 y.o. female with medical history significant for ??  Crohn's disease, macular degeneration, GERD, atrial fibrillation, coronary artery disease, status post pacemaker insertion for sinus node dysfunction who presents to the emergency room via EMS for evaluation of 1 day history of nausea, vomiting and periumbilical abdominal pain which she rated 10 x 10 in intensity at its worst. Abdominal pain is mostly in the periumbilical area and is described as a burning sensation and is nonradiating and has been associated with nausea and multiple episodes of bilious emesis.  Last bowel movement was 1 day prior to admission and she states that she had loose stools.  She takes Imodium almost on a daily basis for diarrhea. She has had cholecystectomy done in the past as well as polypectomy. She denies having any fever, no chills, no cough, no headache, no dizziness, no lightheadedness, no palpitations, no diaphoresis, no urinary frequency, no nocturia, no dysuria, no lower extremity swelling. Chart review shows patient had an EGD and colonoscopy in March 2022 which showed multiple ulcers in the distal ileum with relative sparing of the terminal ileum and also a potential area of narrowing and stenosis in the distal ileum.  She was offered budesonide therapy which she declined due to concern for side effects.  She has a history of chronic diarrhea and takes Imodium daily as needed.   Assessment & Plan:   Principal Problem:   Partial small bowel obstruction (HCC) Active Problems:   Coronary artery disease, non-occlusive   Episodic atrial fibrillation (HCC)   Bilateral hearing loss   Hiatal hernia with gastroesophageal reflux   Crohn's disease ([place])  # Partial small bowel obstruction Likely 2/2 Crohn's flare. Abdominal pain much  improved, no nausea/vomiting since PM of 12/8. Stooled earlier this morning. Gen surg has signed off. GI is following - full liquid diet, likely softs tomorrow - treating crohn's flare as below - stop ifv  # Crohn's disease Recent diagnosis - GI following - hx steroid-induced psychosis and did begin seeing things (snakes on ceiling) this morning. Refused further methylpred. GI is trying budesonide now as well as rifaximin w/ plan for 2 weeks of that  # A-fib Has pacemaker. Asymptomatic, rate is controlled. Qtc 460 - cont home tikosyn - cont eliquis  # COPD Quiescent - home inhaler prn  # B12 deficiency - start oral tx  DVT prophylaxis: eliquis Code Status: full Family Communication: son updated telephonically 12/9. No answer when phoned today  Level of care: Telemetry Medical Status is: Inpatient  Remains inpatient appropriate because: severity of illness      Consultants:  Gi, gen surg  Procedures: none  Antimicrobials:  none    Subjective: Pain resolved. No n/v. Stooled this morning  Objective: Vitals:   04/01/21 1627 04/01/21 1947 04/02/21 0414 04/02/21 0735  BP: 117/78 (!) 158/80 (!) 156/92 (!) 143/83  Pulse:  74 82 75  Resp: 18 20 20 12   Temp: 99.1 F (37.3 C) 98.1 F (36.7 C) 98.4 F (36.9 C) 97.7 F (36.5 C)  TempSrc: Oral  Oral Oral  SpO2: 93% 97% 94% 95%  Weight:      Height:        Intake/Output Summary (Last 24 hours) at 04/02/2021 1435 Last data filed at 04/02/2021 1407 Gross per 24 hour  Intake 3560.21 ml  Output 1750 ml  Net 1810.21 ml   Filed Weights   03/31/21 0712  Weight: 72.1 kg    Examination:  General exam: Appears calm and comfortable  Respiratory system: Clear to auscultation. Respiratory effort normal. Cardiovascular system: mod systolic murmur Gastrointestinal system: Abdomen is nondistended, soft and non-tender. No organomegaly or masses felt. Decreased bowel sounds throughout Central nervous system: Alert  and oriented. No focal neurological deficits. Extremities: Symmetric 5 x 5 power. Skin: No rashes, lesions or ulcers Psychiatry: Judgement and insight appear normal. Mood & affect appropriate.     Data Reviewed: I have personally reviewed following labs and imaging studies  CBC: Recent Labs  Lab 03/31/21 0715 04/01/21 1058 04/02/21 0637  WBC 14.6* 8.8 9.8  HGB 14.0 11.5* 11.3*  HCT 42.6 35.1* 35.4*  MCV 89.5 90.0 89.2  PLT 320 270 450   Basic Metabolic Panel: Recent Labs  Lab 03/31/21 0715 04/01/21 1058 04/02/21 0637  NA 134* 135 140  K 3.7 3.8 3.8  CL 100 105 108  CO2 26 24 25   GLUCOSE 169* 112* 136*  BUN 20 22 17   CREATININE 0.54 0.66 0.49  CALCIUM 9.1 8.1* 8.4*  MG 2.0  --  2.2   GFR: Estimated Creatinine Clearance: 43.6 mL/min (by C-G formula based on SCr of 0.49 mg/dL). Liver Function Tests: Recent Labs  Lab 03/31/21 0715  AST 31  ALT 17  ALKPHOS 64  BILITOT 0.6  PROT 7.2  ALBUMIN 3.8   Recent Labs  Lab 03/31/21 0715  LIPASE 35   No results for input(s): AMMONIA in the last 168 hours. Coagulation Profile: No results for input(s): INR, PROTIME in the last 168 hours. Cardiac Enzymes: No results for input(s): CKTOTAL, CKMB, CKMBINDEX, TROPONINI in the last 168 hours. BNP (last 3 results) No results for input(s): PROBNP in the last 8760 hours. HbA1C: No results for input(s): HGBA1C in the last 72 hours. CBG: No results for input(s): GLUCAP in the last 168 hours. Lipid Profile: No results for input(s): CHOL, HDL, LDLCALC, TRIG, CHOLHDL, LDLDIRECT in the last 72 hours. Thyroid Function Tests: No results for input(s): TSH, T4TOTAL, FREET4, T3FREE, THYROIDAB in the last 72 hours. Anemia Panel: Recent Labs    04/01/21 1340  VITAMINB12 152*   Urine analysis:    Component Value Date/Time   COLORURINE YELLOW (A) 06/12/2018 1233   APPEARANCEUR CLEAR (A) 06/12/2018 1233   APPEARANCEUR Clear 04/03/2012 1431   LABSPEC >1.046 (H) 06/12/2018 1233    LABSPEC 1.005 04/03/2012 1431   PHURINE 5.0 06/12/2018 1233   GLUCOSEU NEGATIVE 06/12/2018 1233   GLUCOSEU Negative 04/03/2012 1431   HGBUR SMALL (A) 06/12/2018 1233   BILIRUBINUR NEGATIVE 06/12/2018 1233   BILIRUBINUR Negative 04/03/2012 1431   KETONESUR 20 (A) 06/12/2018 1233   PROTEINUR NEGATIVE 06/12/2018 1233   NITRITE NEGATIVE 06/12/2018 1233   LEUKOCYTESUR NEGATIVE 06/12/2018 1233   LEUKOCYTESUR Negative 04/03/2012 1431   Sepsis Labs: @LABRCNTIP (procalcitonin:4,lacticidven:4)  ) Recent Results (from the past 240 hour(s))  Resp Panel by RT-PCR (Flu A&B, Covid) Nasopharyngeal Swab     Status: None   Collection Time: 03/31/21  8:28 AM   Specimen: Nasopharyngeal Swab; Nasopharyngeal(NP) swabs in vial transport medium  Result Value Ref Range Status   SARS Coronavirus 2 by RT PCR NEGATIVE NEGATIVE Final    Comment: (NOTE) SARS-CoV-2 target nucleic acids are NOT DETECTED.  The SARS-CoV-2 RNA is generally detectable in upper respiratory specimens during the acute phase of infection. The lowest concentration of SARS-CoV-2 viral copies this  assay can detect is 138 copies/mL. A negative result does not preclude SARS-Cov-2 infection and should not be used as the sole basis for treatment or other patient management decisions. A negative result may occur with  improper specimen collection/handling, submission of specimen other than nasopharyngeal swab, presence of viral mutation(s) within the areas targeted by this assay, and inadequate number of viral copies(<138 copies/mL). A negative result must be combined with clinical observations, patient history, and epidemiological information. The expected result is Negative.  Fact Sheet for Patients:  EntrepreneurPulse.com.au  Fact Sheet for Healthcare Providers:  IncredibleEmployment.be  This test is no t yet approved or cleared by the [place] FDA and  has been authorized for detection  and/or diagnosis of SARS-CoV-2 by FDA under an Emergency Use Authorization (EUA). This EUA will remain  in effect (meaning this test can be used) for the duration of the COVID-19 declaration under Section 564(b)(1) of the Act, 21 U.S.C.section 360bbb-3(b)(1), unless the authorization is terminated  or revoked sooner.       Influenza A by PCR NEGATIVE NEGATIVE Final   Influenza B by PCR NEGATIVE NEGATIVE Final    Comment: (NOTE) The Xpert Xpress SARS-CoV-2/FLU/RSV plus assay is intended as an aid in the diagnosis of influenza from Nasopharyngeal swab specimens and should not be used as a sole basis for treatment. Nasal washings and aspirates are unacceptable for Xpert Xpress SARS-CoV-2/FLU/RSV testing.  Fact Sheet for Patients: EntrepreneurPulse.com.au  Fact Sheet for Healthcare Providers: IncredibleEmployment.be  This test is not yet approved or cleared by the [place] FDA and has been authorized for detection and/or diagnosis of SARS-CoV-2 by FDA under an Emergency Use Authorization (EUA). This EUA will remain in effect (meaning this test can be used) for the duration of the COVID-19 declaration under Section 564(b)(1) of the Act, 21 U.S.C. section 360bbb-3(b)(1), unless the authorization is terminated or revoked.  Performed at [facility], [address], [place], [place] [address]          Radiology Studies: No results found.      Scheduled Meds:  Aflibercept   Ophthalmic Daily   apixaban  5 mg Oral BID   budesonide  9 mg Oral Daily   dofetilide  250 mcg Oral BID   pantoprazole (PROTONIX) IV  40 mg Intravenous Daily   rifaximin  550 mg Oral BID   vitamin B-12  1,000 mcg Oral Daily   Continuous Infusions:  sodium chloride 75 mL/hr at 04/02/21 0513     LOS: 2 days    Time spent: 25 min    Desma Maxim, MD [facility]   If 7PM-7AM, please contact night-coverage [email or web address] Password  [email or web address] 04/02/2021, 2:35 PM

## 2021-04-03 DIAGNOSIS — K50012 Crohn's disease of small intestine with intestinal obstruction: Secondary | ICD-10-CM

## 2021-04-03 DIAGNOSIS — I480 Paroxysmal atrial fibrillation: Secondary | ICD-10-CM | POA: Diagnosis not present

## 2021-04-03 DIAGNOSIS — K56600 Partial intestinal obstruction, unspecified as to cause: Secondary | ICD-10-CM | POA: Diagnosis not present

## 2021-04-03 LAB — BASIC METABOLIC PANEL
Anion gap: 10 (ref 5–15)
BUN: 14 mg/dL (ref 8–23)
CO2: 25 mmol/L (ref 22–32)
Calcium: 9 mg/dL (ref 8.9–10.3)
Chloride: 103 mmol/L (ref 98–111)
Creatinine, Ser: 0.56 mg/dL (ref 0.44–1.00)
GFR, Estimated: >60 mL/min (ref >60)
Glucose, Bld: 110 mg/dL — ABNORMAL HIGH (ref 70–99)
Potassium: 3.5 mmol/L (ref 3.5–5.1)
Sodium: 138 mmol/L (ref 135–145)

## 2021-04-03 LAB — FERRITIN: Ferritin: 62 ng/mL (ref 11–307)

## 2021-04-03 LAB — IRON AND TIBC
Iron: 42 ug/dL (ref 28–170)
Saturation Ratios: 11 % (ref 10.4–31.8)
TIBC: 396 ug/dL (ref 250–450)
UIBC: 354 ug/dL

## 2021-04-03 LAB — FOLATE: Folate: 38 ng/mL (ref >5.9)

## 2021-04-03 LAB — C-REACTIVE PROTEIN: CRP: 0.8 mg/dL (ref <1.0)

## 2021-04-03 MED ORDER — POTASSIUM CHLORIDE CRYS ER 20 MEQ PO TBCR
40.0000 meq | EXTENDED_RELEASE_TABLET | Freq: Once | ORAL | Status: AC
  Administered 2021-04-03: 40 meq via ORAL
  Filled 2021-04-03: qty 2

## 2021-04-03 MED ORDER — POTASSIUM CHLORIDE 10 MEQ/100ML IV SOLN
10.0000 meq | INTRAVENOUS | Status: AC
  Administered 2021-04-03: 10 meq via INTRAVENOUS
  Filled 2021-04-03 (×3): qty 100

## 2021-04-03 MED ORDER — METOPROLOL TARTRATE 5 MG/5ML IV SOLN: 5.0000 mg | Freq: Three times a day (TID) | INTRAVENOUS | Status: DC

## 2021-04-03 MED ORDER — DILTIAZEM HCL 60 MG PO TABS
60.0000 mg | ORAL_TABLET | Freq: Three times a day (TID) | ORAL | Status: DC
  Administered 2021-04-03 – 2021-04-04 (×3): 60 mg via ORAL
  Filled 2021-04-03 (×4): qty 1

## 2021-04-03 MED ORDER — METOPROLOL TARTRATE 5 MG/5ML IV SOLN: 5.0000 mg | Freq: Three times a day (TID) | INTRAVENOUS | Status: DC | PRN

## 2021-04-03 NOTE — Progress Notes (Signed)
Chelsey Darby, MD [address]  [place]  [place], [place] [address]  Main: [phone]  Fax: [phone] Pager: [phone]   Subjective: Patient is upset this morning she had a bowel movement in the bed and she is not able to get out of bed to use the bathroom.  She is started having tachycardia since 6 AM today.  She said she did not sleep well last night.  She reports that her abdominal pain is improving.  She also said she was not quite delirious or hallucinating but she is getting upset/angry.  Patient is tolerating liquid diet well  Objective: Vital signs in last 24 hours: Vitals:   04/02/21 0735 04/02/21 2007 04/03/21 0546 04/03/21 0737  BP: (!) 143/83 (!) 144/90 138/80 116/85  Pulse: 75 73 99 86  Resp: 12   14  Temp: 97.7 F (36.5 C) 98 F (36.7 C) 98 F (36.7 C) 97.7 F (36.5 C)  TempSrc: Oral   Oral  SpO2: 95% 95% 95% 95%  Weight:      Height:       Weight change:   Intake/Output Summary (Last 24 hours) at 04/03/2021 0954 Last data filed at 04/02/2021 2252 Gross per 24 hour  Intake 480 ml  Output 700 ml  Net -220 ml     Exam: Heart:: Regular rate and rhythm, S1S2 present, or without murmur or extra heart sounds Lungs: normal and clear to auscultation Abdomen: soft, mild epigastric and right lower quadrant tenderness, normal bowel sounds   Lab Results: CBC Latest Ref Rng & Units 04/02/2021 04/01/2021 03/31/2021  WBC 4.0 - 10.5 K/uL 9.8 8.8 14.6(H)  Hemoglobin 12.0 - 15.0 g/dL 11.3(L) 11.5(L) 14.0  Hematocrit 36.0 - 46.0 % 35.4(L) 35.1(L) 42.6  Platelets 150 - 400 K/uL 292 270 320   CMP Latest Ref Rng & Units 04/03/2021 04/02/2021 04/01/2021  Glucose 70 - 99 mg/dL 110(H) 136(H) 112(H)  BUN 8 - 23 mg/dL 14 17 22   Creatinine 0.44 - 1.00 mg/dL 0.56 0.49 0.66  Sodium 135 - 145 mmol/L 138 140 135  Potassium 3.5 - 5.1 mmol/L 3.5 3.8 3.8  Chloride 98 - 111 mmol/L 103 108 105  CO2 22 - 32 mmol/L 25 25 24   Calcium 8.9 - 10.3 mg/dL 9.0 8.4(L)  8.1(L)  Total Protein 6.5 - 8.1 g/dL - - -  Total Bilirubin 0.3 - 1.2 mg/dL - - -  Alkaline Phos 38 - 126 U/L - - -  AST 15 - 41 U/L - - -  ALT 0 - 44 U/L - - -    Micro Results: Recent Results (from the past 240 hour(s))  Resp Panel by RT-PCR (Flu A&B, Covid) Nasopharyngeal Swab     Status: None   Collection Time: 03/31/21  8:28 AM   Specimen: Nasopharyngeal Swab; Nasopharyngeal(NP) swabs in vial transport medium  Result Value Ref Range Status   SARS Coronavirus 2 by RT PCR NEGATIVE NEGATIVE Final    Comment: (NOTE) SARS-CoV-2 target nucleic acids are NOT DETECTED.  The SARS-CoV-2 RNA is generally detectable in upper respiratory specimens during the acute phase of infection. The lowest concentration of SARS-CoV-2 viral copies this assay can detect is 138 copies/mL. A negative result does not preclude SARS-Cov-2 infection and should not be used as the sole basis for treatment or other patient management decisions. A negative result may occur with  improper specimen collection/handling, submission of specimen other than nasopharyngeal swab, presence of viral mutation(s) within the areas targeted by this  assay, and inadequate number of viral copies(<138 copies/mL). A negative result must be combined with clinical observations, patient history, and epidemiological information. The expected result is Negative.  Fact Sheet for Patients:  EntrepreneurPulse.com.au  Fact Sheet for Healthcare Providers:  IncredibleEmployment.be  This test is no t yet approved or cleared by the [place] FDA and  has been authorized for detection and/or diagnosis of SARS-CoV-2 by FDA under an Emergency Use Authorization (EUA). This EUA will remain  in effect (meaning this test can be used) for the duration of the COVID-19 declaration under Section 564(b)(1) of the Act, 21 U.S.C.section 360bbb-3(b)(1), unless the authorization is terminated  or revoked sooner.        Influenza A by PCR NEGATIVE NEGATIVE Final   Influenza B by PCR NEGATIVE NEGATIVE Final    Comment: (NOTE) The Xpert Xpress SARS-CoV-2/FLU/RSV plus assay is intended as an aid in the diagnosis of influenza from Nasopharyngeal swab specimens and should not be used as a sole basis for treatment. Nasal washings and aspirates are unacceptable for Xpert Xpress SARS-CoV-2/FLU/RSV testing.  Fact Sheet for Patients: EntrepreneurPulse.com.au  Fact Sheet for Healthcare Providers: IncredibleEmployment.be  This test is not yet approved or cleared by the [place] FDA and has been authorized for detection and/or diagnosis of SARS-CoV-2 by FDA under an Emergency Use Authorization (EUA). This EUA will remain in effect (meaning this test can be used) for the duration of the COVID-19 declaration under Section 564(b)(1) of the Act, 21 U.S.C. section 360bbb-3(b)(1), unless the authorization is terminated or revoked.  Performed at [facility], [address], [place], [place] [address]    Studies/Results: No results found. Medications: I have reviewed the patient's current medications. Prior to Admission:  Medications Prior to Admission  Medication Sig Dispense Refill Last Dose   Aflibercept (EYLEA) 2 MG/0.05ML SOLN Apply to eye.   Past Month   apixaban (ELIQUIS) 5 MG TABS tablet Take 1 tablet (5 mg total) by mouth 2 (two) times daily. 90 tablet 3 03/30/2021 at 2100   calcium carbonate (TUMS - DOSED IN MG ELEMENTAL CALCIUM) 500 MG chewable tablet Chew 1 tablet by mouth every other day.   03/30/2021   Calcium Carbonate-Vitamin D (CALCIUM-VITAMIN D3 PO) Take 1 each by mouth daily.   03/30/2021 at AM   dofetilide (TIKOSYN) 250 MCG capsule Take 250 mcg by mouth 2 (two) times daily.   03/30/2021 at 2100   ferrous sulfate 325 (65 FE) MG tablet Take 1 tablet by mouth daily with breakfast.   03/30/2021   Hyoscyamine Sulfate SL 0.125 MG SUBL Place  0.125 mg under the tongue every 4 (four) hours as needed.   03/30/2021   Lecithin 1200 MG CAPS Take by mouth.   03/30/2021   olopatadine (PATANOL) 0.1 % ophthalmic solution Place 1 drop into the right eye 2 (two) times daily.   0 Past Week at PRN   omeprazole (PRILOSEC) 20 MG capsule Take 20 mg by mouth daily as needed.    03/30/2021 at NIGHT   potassium chloride SA (KLOR-CON M) 20 MEQ tablet Take 20 mEq by mouth daily.   03/30/2021   Specialty Vitamins Products (MAGNESIUM, AMINO ACID CHELATE,) 133 MG tablet Take 2 tablets by mouth daily.   03/30/2021   acetaminophen (TYLENOL) 325 MG tablet Take 325 mg by mouth every 4 (four) hours as needed.   PRN at PRN   albuterol (PROVENTIL HFA;VENTOLIN HFA) 108 (90 Base) MCG/ACT inhaler Inhale 2 puffs into the lungs every 6 (six)  hours as needed for wheezing or shortness of breath. 3 each 3    cholestyramine (QUESTRAN) 4 g packet Take by mouth 2 (two) times daily.   0    ipratropium (ATROVENT HFA) 17 MCG/ACT inhaler Inhale 2 puffs into the lungs 4 (four) times daily.      lisinopril (PRINIVIL,ZESTRIL) 40 MG tablet Take 1 tablet (40 mg) by mouth once daily as directed   PRN at PRN   loperamide (IMODIUM) 2 MG capsule Take 2 mg by mouth as needed for diarrhea or loose stools.   PRN at PRN   psyllium (METAMUCIL) 58.6 % packet Take 1 packet by mouth daily as needed.   PRN at PRN   Scheduled:  Aflibercept   Ophthalmic Daily   apixaban  5 mg Oral BID   budesonide  9 mg Oral Daily   diltiazem  60 mg Oral Q8H   dofetilide  250 mcg Oral BID   metoprolol tartrate  5 mg Intravenous Q8H   pantoprazole  40 mg Oral Daily   potassium chloride  40 mEq Oral Once   rifaximin  550 mg Oral BID   vitamin B-12  1,000 mcg Oral Daily   Continuous:  potassium chloride     GUR:[id number], ipratropium, ondansetron **OR** ondansetron (ZOFRAN) IV Anti-infectives (From admission, onward)    Start     Dose/Rate Route Frequency Ordered Stop   04/02/21 1145  rifaximin (XIFAXAN)  tablet 550 mg        550 mg Oral 2 times daily 04/02/21 1046        Scheduled Meds:  Aflibercept   Ophthalmic Daily   apixaban  5 mg Oral BID   budesonide  9 mg Oral Daily   diltiazem  60 mg Oral Q8H   dofetilide  250 mcg Oral BID   metoprolol tartrate  5 mg Intravenous Q8H   pantoprazole  40 mg Oral Daily   potassium chloride  40 mEq Oral Once   rifaximin  550 mg Oral BID   vitamin B-12  1,000 mcg Oral Daily   Continuous Infusions:  potassium chloride     PRN Meds:.hydrALAZINE, ipratropium, ondansetron **OR** ondansetron (ZOFRAN) IV   Assessment: Principal Problem:   Partial small bowel obstruction (HCC) Active Problems:   Coronary artery disease, non-occlusive   Episodic atrial fibrillation (HCC)   Bilateral hearing loss   Hiatal hernia with gastroesophageal reflux   Crohn's disease ([place])   B12 deficiency  Plan: Exacerbation of small bowel Crohn's presenting with partial small bowel obstruction: Non stricturing, nonpenetrating phenotype Clinically improving, had a diarrheal episode today, abdomen is soft, mild right lower quadrant tenderness Switched from Solu-Medrol to budesonide 3 mg 3 pills daily due to patient and her son's s concern for steroid-induced psychosis.  She was experiencing hallucinations on 12/10.  Given her age, it is quite possible that she also has hospital induced delirium We will monitor her if she is able to tolerate budesonide in order to make an informed decision if she can be discharged on this medication Continue Xifaxan 550 mg p.o. twice daily for 2 weeks Advance to soft diet today Continue B12 1000MCG daily Discussed with patient regarding importance of a soft diet long-term Follow-up with [facility] primary GI upon discharge  Tachycardia Patient is on beta-blocker and calcium channel blocker Defer to primary team   LOS: 3 days   Emerick Weatherly 04/03/2021, 9:54 AM

## 2021-04-03 NOTE — Progress Notes (Signed)
PROGRESS NOTE    Chelsey Williams  DGU:[id number] DOB: Sep 13, 1927 DOA: 03/31/2021 PCP: Clarisse Gouge, MD  Outpatient Specialists: GI    Brief Narrative:   Chelsey Williams is a 87 y.o. female with medical history significant for ??  Crohn's disease, macular degeneration, GERD, atrial fibrillation, coronary artery disease, status post pacemaker insertion for sinus node dysfunction who presents to the emergency room via EMS for evaluation of 1 day history of nausea, vomiting and periumbilical abdominal pain which she rated 10 x 10 in intensity at its worst. Abdominal pain is mostly in the periumbilical area and is described as a burning sensation and is nonradiating and has been associated with nausea and multiple episodes of bilious emesis.  Last bowel movement was 1 day prior to admission and she states that she had loose stools.  She takes Imodium almost on a daily basis for diarrhea. She has had cholecystectomy done in the past as well as polypectomy. She denies having any fever, no chills, no cough, no headache, no dizziness, no lightheadedness, no palpitations, no diaphoresis, no urinary frequency, no nocturia, no dysuria, no lower extremity swelling. Chart review shows patient had an EGD and colonoscopy in March 2022 which showed multiple ulcers in the distal ileum with relative sparing of the terminal ileum and also a potential area of narrowing and stenosis in the distal ileum.  She was offered budesonide therapy which she declined due to concern for side effects.  She has a history of chronic diarrhea and takes Imodium daily as needed.   Assessment & Plan:   Principal Problem:   Partial small bowel obstruction (HCC) Active Problems:   Coronary artery disease, non-occlusive   Episodic atrial fibrillation (HCC)   Bilateral hearing loss   Hiatal hernia with gastroesophageal reflux   Crohn's disease ([place])   B12 deficiency  # Partial small bowel obstruction Likely 2/2 Crohn's flare.  Abdominal pain much improved, no nausea/vomiting since PM of 12/8. Passing stool and flatus. Gen surg has signed off. GI is following. Tolerated full liquid diet yesterday. Is off IVF - advance to soft diet - treating crohn's flare as below - possible d/c tomorrow. PT says no HH need  # Crohn's disease Recent diagnosis - GI following - hx steroid-induced psychosis and did begin seeing things (snakes on ceiling) AM of 12/10. Refused further methylpred. GI is trying budesonide now as well as rifaximin w/ plan for 2 weeks of that. Delirium is resolved  # A-fib # History SA node dysfunction Has pacemaker, hx of cardioversion. Asymptomatic. Qtc 460 on admit. Runs of tachycardia to 150s and higher - pharmacy dosing potassium to maintain around 4 - cont home tikosyn - cont eliquis - cards consult for tachycardia  # COPD Quiescent - home inhaler prn  # B12 deficiency - started oral tx  DVT prophylaxis: eliquis Code Status: full Family Communication: son updated telephonically 12/11  Level of care: Telemetry Medical Status is: Inpatient  Remains inpatient appropriate because: severity of illness      Consultants:  Gi, gen surg  Procedures: none  Antimicrobials:  none    Subjective: Pain resolved. No n/v. Tolerating diet  Objective: Vitals:   04/02/21 0735 04/02/21 2007 04/03/21 0546 04/03/21 0737  BP: (!) 143/83 (!) 144/90 138/80 116/85  Pulse: 75 73 99 86  Resp: 12   14  Temp: 97.7 F (36.5 C) 98 F (36.7 C) 98 F (36.7 C) 97.7 F (36.5 C)  TempSrc: Oral   Oral  SpO2: 95% 95%  95% 95%  Weight:      Height:        Intake/Output Summary (Last 24 hours) at 04/03/2021 0837 Last data filed at 04/02/2021 2252 Gross per 24 hour  Intake 480 ml  Output 1050 ml  Net -570 ml   Filed Weights   03/31/21 0712  Weight: 72.1 kg    Examination:  General exam: Appears calm and comfortable  Respiratory system: Clear to auscultation. Respiratory effort  normal. Cardiovascular system: mod systolic murmur Gastrointestinal system: Abdomen is nondistended, soft and non-tender. No organomegaly or masses felt. Decreased bowel sounds throughout Central nervous system: Alert and oriented. No focal neurological deficits. Extremities: Symmetric 5 x 5 power. Skin: No rashes, lesions or ulcers Psychiatry: Judgement and insight appear normal. Mood & affect appropriate.     Data Reviewed: I have personally reviewed following labs and imaging studies  CBC: Recent Labs  Lab 03/31/21 0715 04/01/21 1058 04/02/21 0637  WBC 14.6* 8.8 9.8  HGB 14.0 11.5* 11.3*  HCT 42.6 35.1* 35.4*  MCV 89.5 90.0 89.2  PLT 320 270 935   Basic Metabolic Panel: Recent Labs  Lab 03/31/21 0715 04/01/21 1058 04/02/21 0637 04/03/21 0552  NA 134* 135 140 138  K 3.7 3.8 3.8 3.5  CL 100 105 108 103  CO2 26 24 25 25   GLUCOSE 169* 112* 136* 110*  BUN 20 22 17 14   CREATININE 0.54 0.66 0.49 0.56  CALCIUM 9.1 8.1* 8.4* 9.0  MG 2.0  --  2.2  --    GFR: Estimated Creatinine Clearance: 43.6 mL/min (by C-G formula based on SCr of 0.56 mg/dL). Liver Function Tests: Recent Labs  Lab 03/31/21 0715  AST 31  ALT 17  ALKPHOS 64  BILITOT 0.6  PROT 7.2  ALBUMIN 3.8   Recent Labs  Lab 03/31/21 0715  LIPASE 35   No results for input(s): AMMONIA in the last 168 hours. Coagulation Profile: No results for input(s): INR, PROTIME in the last 168 hours. Cardiac Enzymes: No results for input(s): CKTOTAL, CKMB, CKMBINDEX, TROPONINI in the last 168 hours. BNP (last 3 results) No results for input(s): PROBNP in the last 8760 hours. HbA1C: No results for input(s): HGBA1C in the last 72 hours. CBG: No results for input(s): GLUCAP in the last 168 hours. Lipid Profile: No results for input(s): CHOL, HDL, LDLCALC, TRIG, CHOLHDL, LDLDIRECT in the last 72 hours. Thyroid Function Tests: No results for input(s): TSH, T4TOTAL, FREET4, T3FREE, THYROIDAB in the last 72  hours. Anemia Panel: Recent Labs    04/01/21 1340  VITAMINB12 152*   Urine analysis:    Component Value Date/Time   COLORURINE YELLOW (A) 06/12/2018 1233   APPEARANCEUR CLEAR (A) 06/12/2018 1233   APPEARANCEUR Clear 04/03/2012 1431   LABSPEC >1.046 (H) 06/12/2018 1233   LABSPEC 1.005 04/03/2012 1431   PHURINE 5.0 06/12/2018 1233   GLUCOSEU NEGATIVE 06/12/2018 1233   GLUCOSEU Negative 04/03/2012 1431   HGBUR SMALL (A) 06/12/2018 1233   BILIRUBINUR NEGATIVE 06/12/2018 1233   BILIRUBINUR Negative 04/03/2012 1431   KETONESUR 20 (A) 06/12/2018 1233   PROTEINUR NEGATIVE 06/12/2018 1233   NITRITE NEGATIVE 06/12/2018 1233   LEUKOCYTESUR NEGATIVE 06/12/2018 1233   LEUKOCYTESUR Negative 04/03/2012 1431   Sepsis Labs: @LABRCNTIP (procalcitonin:4,lacticidven:4)  ) Recent Results (from the past 240 hour(s))  Resp Panel by RT-PCR (Flu A&B, Covid) Nasopharyngeal Swab     Status: None   Collection Time: 03/31/21  8:28 AM   Specimen: Nasopharyngeal Swab; Nasopharyngeal(NP) swabs in vial transport medium  Result Value Ref Range Status   SARS Coronavirus 2 by RT PCR NEGATIVE NEGATIVE Final    Comment: (NOTE) SARS-CoV-2 target nucleic acids are NOT DETECTED.  The SARS-CoV-2 RNA is generally detectable in upper respiratory specimens during the acute phase of infection. The lowest concentration of SARS-CoV-2 viral copies this assay can detect is 138 copies/mL. A negative result does not preclude SARS-Cov-2 infection and should not be used as the sole basis for treatment or other patient management decisions. A negative result may occur with  improper specimen collection/handling, submission of specimen other than nasopharyngeal swab, presence of viral mutation(s) within the areas targeted by this assay, and inadequate number of viral copies(<138 copies/mL). A negative result must be combined with clinical observations, patient history, and epidemiological information. The expected result  is Negative.  Fact Sheet for Patients:  EntrepreneurPulse.com.au  Fact Sheet for Healthcare Providers:  IncredibleEmployment.be  This test is no t yet approved or cleared by the [place] FDA and  has been authorized for detection and/or diagnosis of SARS-CoV-2 by FDA under an Emergency Use Authorization (EUA). This EUA will remain  in effect (meaning this test can be used) for the duration of the COVID-19 declaration under Section 564(b)(1) of the Act, 21 U.S.C.section 360bbb-3(b)(1), unless the authorization is terminated  or revoked sooner.       Influenza A by PCR NEGATIVE NEGATIVE Final   Influenza B by PCR NEGATIVE NEGATIVE Final    Comment: (NOTE) The Xpert Xpress SARS-CoV-2/FLU/RSV plus assay is intended as an aid in the diagnosis of influenza from Nasopharyngeal swab specimens and should not be used as a sole basis for treatment. Nasal washings and aspirates are unacceptable for Xpert Xpress SARS-CoV-2/FLU/RSV testing.  Fact Sheet for Patients: EntrepreneurPulse.com.au  Fact Sheet for Healthcare Providers: IncredibleEmployment.be  This test is not yet approved or cleared by the [place] FDA and has been authorized for detection and/or diagnosis of SARS-CoV-2 by FDA under an Emergency Use Authorization (EUA). This EUA will remain in effect (meaning this test can be used) for the duration of the COVID-19 declaration under Section 564(b)(1) of the Act, 21 U.S.C. section 360bbb-3(b)(1), unless the authorization is terminated or revoked.  Performed at [facility], [address], [place], [place] [address]          Radiology Studies: No results found.      Scheduled Meds:  Aflibercept   Ophthalmic Daily   apixaban  5 mg Oral BID   budesonide  9 mg Oral Daily   dofetilide  250 mcg Oral BID   metoprolol tartrate  5 mg Intravenous Q8H   pantoprazole  40 mg Oral  Daily   potassium chloride  40 mEq Oral Once   rifaximin  550 mg Oral BID   vitamin B-12  1,000 mcg Oral Daily   Continuous Infusions:  potassium chloride       LOS: 3 days    Time spent: 25 min    Desma Maxim, MD [facility]   If 7PM-7AM, please contact night-coverage [email or web address] Password [facility] 04/03/2021, 8:37 AM

## 2021-04-03 NOTE — Progress Notes (Addendum)
Pharmacy: Dofetilide (Tikosyn) - Follow Up Assessment and Electrolyte Replacement  Pharmacy consulted to assist in monitoring and replacing electrolytes in this 87 y.o. female admitted on 03/31/2021 undergoing dofetilide re-initiation. First dofetilide dose: 04/02/21 2147  Labs:    Component Value Date/Time   K 3.5 04/03/2021 0552   MG 2.2 04/02/2021 7616     Plan:  Potassium: K 3.5:  Give Kcl 67mq PO x 1 dose and Kcl IV 10 mEq x 3 doses   Magnesium: Mg > 2: No additional supplementation needed   Thank you for allowing pharmacy to participate in this patient's care   [facility] Chelsey Williams 04/03/2021  8:16 AM

## 2021-04-03 NOTE — Consult Note (Signed)
[facility] Cardiology  CARDIOLOGY CONSULT NOTE  Patient ID: Chelsey Williams MRN: [id number] DOB/AGE: [birth year] 87 y.o.  Admit date: 03/31/2021 Referring Physician [facility] Primary Physician [place] Primary Cardiologist [place] / Omelia Blackwater Reason for Consultation atrial fibrillation  HPI: 87 year old female referred for evaluation of atrial fibrillation.  Patient has history of accessible atrial fibrillation, on Eliquis for stroke prevention, and Tikosyn for rhythm control.  The patient has a history of sick sinus syndrome, status post dual-chamber pacemaker, followed by Dr. Omelia Blackwater at [facility].  The patient was admitted 03/31/2021 with nausea, vomiting, and abdominal pain to be due to partial small bowel obstruction likely secondary to Crohn's exacerbation which has slowly improving.  Patient reports a history of intermittent episodes of tachycardia.  On telemetry, episodes of atrial fibrillation with rapid ventricular rate at 130 140 bpm observed.  2D echocardiogram 01/25/2021 revealed normal left ventricular function, with evidence of mild aortic stenosis.  Review of systems complete and found to be negative unless listed above     Past Medical History:  Diagnosis Date   Anxiety    Atypical chest pain 11/07/2016   Bilateral hearing loss    Coronary artery disease, non-occlusive    a. LHC 11/2014: R dom, LM nl, mLAD 30-40, dLAD minor irregs, LCx minor irregs, RCA minor irregs w/ associated ectasia   Depression    GERD (gastroesophageal reflux disease)    Glaucoma    Hyperlipemia    Hypertension    Macular degeneration    OA (osteoarthritis)    Pacemaker    a. Medtronic Adapta ADDRL1 dual-chamber pacemaker (serial number [id number]) with Medtronic 5732 atrial lead (serial number [id number]) and Medtronic 5076 ventricular lead (serial number [id number]), all implanted 12/08/2014 by Dr. Jacklynn Barnacle at [facility] [phone].     Palpitations 08/16/2016   Persistent atrial fibrillation ([place])    a. s/p DCCV  02/2016; b. sotalol and Eliquis; c. CHADS2VASc --> 4 (HTN, age x 2, female)   Sinus node dysfunction ([place])     Past Surgical History:  Procedure Laterality Date   CHOLECYSTECTOMY     knee replacmen     PACEMAKER IMPLANT  2014   thyroid nodule      Medications Prior to Admission  Medication Sig Dispense Refill Last Dose   Aflibercept (EYLEA) 2 MG/0.05ML SOLN Apply to eye.   Past Month   apixaban (ELIQUIS) 5 MG TABS tablet Take 1 tablet (5 mg total) by mouth 2 (two) times daily. 90 tablet 3 03/30/2021 at 2100   calcium carbonate (TUMS - DOSED IN MG ELEMENTAL CALCIUM) 500 MG chewable tablet Chew 1 tablet by mouth every other day.   03/30/2021   Calcium Carbonate-Vitamin D (CALCIUM-VITAMIN D3 PO) Take 1 each by mouth daily.   03/30/2021 at AM   dofetilide (TIKOSYN) 250 MCG capsule Take 250 mcg by mouth 2 (two) times daily.   03/30/2021 at 2100   ferrous sulfate 325 (65 FE) MG tablet Take 1 tablet by mouth daily with breakfast.   03/30/2021   Hyoscyamine Sulfate SL 0.125 MG SUBL Place 0.125 mg under the tongue every 4 (four) hours as needed.   03/30/2021   Lecithin 1200 MG CAPS Take by mouth.   03/30/2021   olopatadine (PATANOL) 0.1 % ophthalmic solution Place 1 drop into the right eye 2 (two) times daily.   0 Past Week at PRN   omeprazole (PRILOSEC) 20 MG capsule Take 20 mg by mouth daily as needed.    03/30/2021 at NIGHT   potassium chloride SA (KLOR-CON M)  20 MEQ tablet Take 20 mEq by mouth daily.   03/30/2021   Specialty Vitamins Products (MAGNESIUM, AMINO ACID CHELATE,) 133 MG tablet Take 2 tablets by mouth daily.   03/30/2021   acetaminophen (TYLENOL) 325 MG tablet Take 325 mg by mouth every 4 (four) hours as needed.   PRN at PRN   albuterol (PROVENTIL HFA;VENTOLIN HFA) 108 (90 Base) MCG/ACT inhaler Inhale 2 puffs into the lungs every 6 (six) hours as needed for wheezing or shortness of breath. 3 each 3    cholestyramine (QUESTRAN) 4 g packet Take by mouth 2 (two) times daily.   0    ipratropium  (ATROVENT HFA) 17 MCG/ACT inhaler Inhale 2 puffs into the lungs 4 (four) times daily.      lisinopril (PRINIVIL,ZESTRIL) 40 MG tablet Take 1 tablet (40 mg) by mouth once daily as directed   PRN at PRN   loperamide (IMODIUM) 2 MG capsule Take 2 mg by mouth as needed for diarrhea or loose stools.   PRN at PRN   psyllium (METAMUCIL) 58.6 % packet Take 1 packet by mouth daily as needed.   PRN at PRN   Social History   Socioeconomic History   Marital status: Widowed    Spouse name: Not on file   Number of children: Not on file   Years of education: Not on file   Highest education level: Not on file  Occupational History   Not on file  Tobacco Use   Smoking status: Former    Types: Cigarettes    Quit date: 11/15/1999    Years since quitting: 21.3   Smokeless tobacco: Never  Vaping Use   Vaping Use: Never used  Substance and Sexual Activity   Alcohol use: No   Drug use: No   Sexual activity: Not Currently  Other Topics Concern   Not on file  Social History Narrative   Not on file   Social Determinants of Health   Financial Resource Strain: Not on file  Food Insecurity: Not on file  Transportation Needs: Not on file  Physical Activity: Not on file  Stress: Not on file  Social Connections: Not on file  Intimate Partner Violence: Not on file    Family History  Problem Relation Age of Onset   Hypertension Mother    Stroke Mother    Hypertension Father       Review of systems complete and found to be negative unless listed above      PHYSICAL EXAM  General: Well developed, well nourished, in no acute distress HEENT:  Normocephalic and atramatic Neck:  No JVD.  Lungs: Clear bilaterally to auscultation and percussion. Heart: HRRR . Normal S1 and S2 without gallops or murmurs.  Abdomen: Bowel sounds are positive, abdomen soft and non-tender  Msk:  Back normal, normal gait. Normal strength and tone for age. Extremities: No clubbing, cyanosis or edema.   Neuro: Alert  and oriented X 3. Psych:  Good affect, responds appropriately  Labs:   Lab Results  Component Value Date   WBC 9.8 04/02/2021   HGB 11.3 (L) 04/02/2021   HCT 35.4 (L) 04/02/2021   MCV 89.2 04/02/2021   PLT 292 04/02/2021    Recent Labs  Lab 03/31/21 0715 04/01/21 1058 04/03/21 0552  NA 134*   < > 138  K 3.7   < > 3.5  CL 100   < > 103  CO2 26   < > 25  BUN 20   < > 14  CREATININE 0.54   < > 0.56  CALCIUM 9.1   < > 9.0  PROT 7.2  --   --   BILITOT 0.6  --   --   ALKPHOS 64  --   --   ALT 17  --   --   AST 31  --   --   GLUCOSE 169*   < > 110*   < > = values in this interval not displayed.   Lab Results  Component Value Date   TROPONINI <0.03 11/07/2016   No results found for: CHOL No results found for: HDL No results found for: LDLCALC No results found for: TRIG No results found for: CHOLHDL No results found for: LDLDIRECT    Radiology: CT ABDOMEN PELVIS W CONTRAST  Result Date: 03/31/2021 CLINICAL DATA:  Acute generalized abdominal pain, nausea, vomiting. EXAM: CT ABDOMEN AND PELVIS WITH CONTRAST TECHNIQUE: Multidetector CT imaging of the abdomen and pelvis was performed using the standard protocol following bolus administration of intravenous contrast. CONTRAST:  175m OMNIPAQUE IOHEXOL 300 MG/ML  SOLN COMPARISON:  January 26, 2019. FINDINGS: Lower chest: Visualized lung bases are unremarkable. Moderate size sliding-type hiatal hernia is noted. Hepatobiliary: Status post cholecystectomy. Intrahepatic and extrahepatic biliary dilatation is noted which may be due to post cholecystectomy status. No focal liver abnormality is noted. Pancreas: Unremarkable. No pancreatic ductal dilatation or surrounding inflammatory changes. Spleen: Normal in size without focal abnormality. Adrenals/Urinary Tract: Adrenal glands appear normal. Bilateral renal cysts. No hydronephrosis or renal obstruction is noted. Urinary bladder is unremarkable. Stomach/Bowel: The stomach and appendix are  unremarkable. There is noted moderate small bowel dilatation with transition zone seen in the distal ileum in the right lower quadrant of the pelvis, best seen on image number 58 of series 2. Potentially this may be due to adhesion or possible focal inflammation involving this portion of the ileum. No colonic dilatation is noted. Vascular/Lymphatic: Aortic atherosclerosis. No enlarged abdominal or pelvic lymph nodes. Reproductive: Uterus and bilateral adnexa are unremarkable. Other: No abdominal wall hernia or abnormality. No abdominopelvic ascites. Musculoskeletal: No acute or significant osseous findings. IMPRESSION: Moderate small bowel dilatation is noted with transition zone seen in the right lower quadrant involving the distal ileum, concerning for partial small bowel obstruction. It is uncertain if this is due to adhesion or possibly mild inflammatory changes involving this portion of the ileum. Moderate size sliding-type hiatal hernia is noted. Aortic Atherosclerosis (ICD10-I70.0). Electronically Signed   By: JMarijo ConceptionM.D.   On: 03/31/2021 09:38    EKG:   ASSESSMENT AND PLAN:   1.  Paroxysmal atrial fibrillation, on Eliquis for stroke prevention, and Tikosyn for rhythm control.  Patient admitted with partial small bowel obstruction, experiencing episodes of fibrillation with rapid ventricular rate, mildly symptomatic. 2.  Sick sinus syndrome, status post dual-chamber pacemaker 3.  Partial small bowel obstruction, secondary to Crohn's exacerbation, clinically improving 4.  Mild aortic stenosis  Recommendations  1.  Agree with current therapy 2.  Continue Eliquis for stroke prevention 3.  Continue Tikosyn for rhythm control 4.  Add diltiazem 60 mg every 8 hours for rate control  Signed: AIsaias CowmanMD,PhD, FPipeline [facility]12/02/2021, 9:18 AM

## 2021-04-04 DIAGNOSIS — K50012 Crohn's disease of small intestine with intestinal obstruction: Secondary | ICD-10-CM | POA: Diagnosis not present

## 2021-04-04 LAB — CBC
HCT: 39.4 % (ref 36.0–46.0)
Hemoglobin: 12.6 g/dL (ref 12.0–15.0)
MCH: 28.8 pg (ref 26.0–34.0)
MCHC: 32 g/dL (ref 30.0–36.0)
MCV: 90 fL (ref 80.0–100.0)
Platelets: 306 10*3/uL (ref 150–400)
RBC: 4.38 MIL/uL (ref 3.87–5.11)
RDW: 14.1 % (ref 11.5–15.5)
WBC: 8.1 10*3/uL (ref 4.0–10.5)
nRBC: 0 % (ref 0.0–0.2)

## 2021-04-04 LAB — BASIC METABOLIC PANEL
Anion gap: 6 (ref 5–15)
BUN: 16 mg/dL (ref 8–23)
CO2: 25 mmol/L (ref 22–32)
Calcium: 8.8 mg/dL — ABNORMAL LOW (ref 8.9–10.3)
Chloride: 109 mmol/L (ref 98–111)
Creatinine, Ser: 0.72 mg/dL (ref 0.44–1.00)
GFR, Estimated: >60 mL/min (ref >60)
Glucose, Bld: 103 mg/dL — ABNORMAL HIGH (ref 70–99)
Potassium: 3.8 mmol/L (ref 3.5–5.1)
Sodium: 140 mmol/L (ref 135–145)

## 2021-04-04 MED ORDER — LOPERAMIDE HCL 2 MG PO CAPS
2.0000 mg | ORAL_CAPSULE | ORAL | Status: DC | PRN
  Administered 2021-04-04: 2 mg via ORAL
  Filled 2021-04-04: qty 1

## 2021-04-04 MED ORDER — RIFAXIMIN 550 MG PO TABS: 550.0000 mg | ORAL_TABLET | Freq: Two times a day (BID) | ORAL | 0 refills | Status: AC

## 2021-04-04 MED ORDER — CYANOCOBALAMIN 1000 MCG PO TABS: 1000.0000 ug | ORAL_TABLET | Freq: Every day | ORAL | 1 refills | Status: AC

## 2021-04-04 MED ORDER — DILTIAZEM HCL ER COATED BEADS 180 MG PO CP24: 180.0000 mg | ORAL_CAPSULE | Freq: Every day | ORAL | 1 refills | Status: DC

## 2021-04-04 MED ORDER — DILTIAZEM HCL ER COATED BEADS 180 MG PO CP24
180.0000 mg | ORAL_CAPSULE | Freq: Every day | ORAL | Status: DC
  Administered 2021-04-04: 180 mg via ORAL
  Filled 2021-04-04: qty 1

## 2021-04-04 MED ORDER — LOPERAMIDE HCL 2 MG PO CAPS: 2.0000 mg | ORAL_CAPSULE | ORAL | Status: DC | PRN

## 2021-04-04 MED ORDER — POTASSIUM CHLORIDE CRYS ER 20 MEQ PO TBCR
40.0000 meq | EXTENDED_RELEASE_TABLET | Freq: Once | ORAL | Status: AC
  Administered 2021-04-04: 40 meq via ORAL
  Filled 2021-04-04: qty 2

## 2021-04-04 MED ORDER — BUDESONIDE 3 MG PO CPEP: 9.0000 mg | ORAL_CAPSULE | Freq: Every day | ORAL | 1 refills | Status: DC

## 2021-04-04 NOTE — Progress Notes (Signed)
Cephas Darby, MD [address]  [place]  [place],  [address]  Main: [phone]  Fax: [phone] Pager: [phone]   Subjective: Patient slept well last night.  She reports that she is not confused anymore.  She is sitting up in chair, had good breakfast and lunch.  She had 1 episode of immediate postprandial diarrhea after breakfast which was watery and nonbloody.  Patient tells me that she has been experiencing diarrhea for a long time even before she was diagnosed with Crohn's and after her husband passed away her diarrhea persisted.  She thought her diarrhea is secondary to nervous stomach.  Objective: Vital signs in last 24 hours: Vitals:   04/03/21 1832 04/03/21 2125 04/04/21 0408 04/04/21 0821  BP: (!) 130/95 118/80 122/79 (!) 157/75  Pulse: (!) 107 70 79 73  Resp: 14 18  20   Temp: 98.5 F (36.9 C) 98.4 F (36.9 C) 98.3 F (36.8 C) 98.7 F (37.1 C)  TempSrc: Oral   Oral  SpO2: 96% 95% 95% 95%  Weight:      Height:       Weight change:   Intake/Output Summary (Last 24 hours) at 04/04/2021 1454 Last data filed at 04/04/2021 1011 Gross per 24 hour  Intake 322.94 ml  Output 801 ml  Net -478.06 ml     Exam: Heart:: Regular rate and rhythm, S1S2 present, or without murmur or extra heart sounds Lungs: normal and clear to auscultation Abdomen: soft, mild epigastric and right lower quadrant tenderness, normal bowel sounds   Lab Results: CBC Latest Ref Rng & Units 04/04/2021 04/02/2021 04/01/2021  WBC 4.0 - 10.5 K/uL 8.1 9.8 8.8  Hemoglobin 12.0 - 15.0 g/dL 12.6 11.3(L) 11.5(L)  Hematocrit 36.0 - 46.0 % 39.4 35.4(L) 35.1(L)  Platelets 150 - 400 K/uL 306 292 270   CMP Latest Ref Rng & Units 04/04/2021 04/03/2021 04/02/2021  Glucose 70 - 99 mg/dL 103(H) 110(H) 136(H)  BUN 8 - 23 mg/dL 16 14 17   Creatinine 0.44 - 1.00 mg/dL 0.72 0.56 0.49  Sodium 135 - 145 mmol/L 140 138 140  Potassium 3.5 - 5.1 mmol/L 3.8 3.5 3.8  Chloride 98 - 111 mmol/L  109 103 108  CO2 22 - 32 mmol/L 25 25 25   Calcium 8.9 - 10.3 mg/dL 8.8(L) 9.0 8.4(L)  Total Protein 6.5 - 8.1 g/dL - - -  Total Bilirubin 0.3 - 1.2 mg/dL - - -  Alkaline Phos 38 - 126 U/L - - -  AST 15 - 41 U/L - - -  ALT 0 - 44 U/L - - -    Micro Results: Recent Results (from the past 240 hour(s))  Resp Panel by RT-PCR (Flu A&B, Covid) Nasopharyngeal Swab     Status: None   Collection Time: 03/31/21  8:28 AM   Specimen: Nasopharyngeal Swab; Nasopharyngeal(NP) swabs in vial transport medium  Result Value Ref Range Status   SARS Coronavirus 2 by RT PCR NEGATIVE NEGATIVE Final    Comment: (NOTE) SARS-CoV-2 target nucleic acids are NOT DETECTED.  The SARS-CoV-2 RNA is generally detectable in upper respiratory specimens during the acute phase of infection. The lowest concentration of SARS-CoV-2 viral copies this assay can detect is 138 copies/mL. A negative result does not preclude SARS-Cov-2 infection and should not be used as the sole basis for treatment or other patient management decisions. A negative result may occur with  improper specimen collection/handling, submission of specimen other than nasopharyngeal swab, presence of viral mutation(s) within the  areas targeted by this assay, and inadequate number of viral copies(<138 copies/mL). A negative result must be combined with clinical observations, patient history, and epidemiological information. The expected result is Negative.  Fact Sheet for Patients:  EntrepreneurPulse.com.au  Fact Sheet for Healthcare Providers:  IncredibleEmployment.be  This test is no t yet approved or cleared by the [place] FDA and  has been authorized for detection and/or diagnosis of SARS-CoV-2 by FDA under an Emergency Use Authorization (EUA). This EUA will remain  in effect (meaning this test can be used) for the duration of the COVID-19 declaration under Section 564(b)(1) of the Act,  21 U.S.C.section 360bbb-3(b)(1), unless the authorization is terminated  or revoked sooner.       Influenza A by PCR NEGATIVE NEGATIVE Final   Influenza B by PCR NEGATIVE NEGATIVE Final    Comment: (NOTE) The Xpert Xpress SARS-CoV-2/FLU/RSV plus assay is intended as an aid in the diagnosis of influenza from Nasopharyngeal swab specimens and should not be used as a sole basis for treatment. Nasal washings and aspirates are unacceptable for Xpert Xpress SARS-CoV-2/FLU/RSV testing.  Fact Sheet for Patients: EntrepreneurPulse.com.au  Fact Sheet for Healthcare Providers: IncredibleEmployment.be  This test is not yet approved or cleared by the [place] FDA and has been authorized for detection and/or diagnosis of SARS-CoV-2 by FDA under an Emergency Use Authorization (EUA). This EUA will remain in effect (meaning this test can be used) for the duration of the COVID-19 declaration under Section 564(b)(1) of the Act, 21 U.S.C. section 360bbb-3(b)(1), unless the authorization is terminated or revoked.  Performed at [facility], [address] [place], [place] [address]    Studies/Results: No results found. Medications: I have reviewed the patient's current medications. Prior to Admission:  Medications Prior to Admission  Medication Sig Dispense Refill Last Dose   Aflibercept (EYLEA) 2 MG/0.05ML SOLN Apply to eye.   Past Month   apixaban (ELIQUIS) 5 MG TABS tablet Take 1 tablet (5 mg total) by mouth 2 (two) times daily. 90 tablet 3 03/30/2021 at 2100   calcium carbonate (TUMS - DOSED IN MG ELEMENTAL CALCIUM) 500 MG chewable tablet Chew 1 tablet by mouth every other day.   03/30/2021   Calcium Carbonate-Vitamin D (CALCIUM-VITAMIN D3 PO) Take 1 each by mouth daily.   03/30/2021 at AM   dofetilide (TIKOSYN) 250 MCG capsule Take 250 mcg by mouth 2 (two) times daily.   03/30/2021 at 2100   ferrous sulfate 325 (65 FE) MG tablet Take 1 tablet  by mouth daily with breakfast.   03/30/2021   Hyoscyamine Sulfate SL 0.125 MG SUBL Place 0.125 mg under the tongue every 4 (four) hours as needed.   03/30/2021   Lecithin 1200 MG CAPS Take by mouth.   03/30/2021   olopatadine (PATANOL) 0.1 % ophthalmic solution Place 1 drop into the right eye 2 (two) times daily.   0 Past Week at PRN   omeprazole (PRILOSEC) 20 MG capsule Take 20 mg by mouth daily as needed.    03/30/2021 at NIGHT   potassium chloride SA (KLOR-CON M) 20 MEQ tablet Take 20 mEq by mouth daily.   03/30/2021   Specialty Vitamins Products (MAGNESIUM, AMINO ACID CHELATE,) 133 MG tablet Take 2 tablets by mouth daily.   03/30/2021   acetaminophen (TYLENOL) 325 MG tablet Take 325 mg by mouth every 4 (four) hours as needed.   PRN at PRN   albuterol (PROVENTIL HFA;VENTOLIN HFA) 108 (90 Base) MCG/ACT inhaler Inhale 2 puffs into the  lungs every 6 (six) hours as needed for wheezing or shortness of breath. 3 each 3    cholestyramine (QUESTRAN) 4 g packet Take by mouth 2 (two) times daily.   0    ipratropium (ATROVENT HFA) 17 MCG/ACT inhaler Inhale 2 puffs into the lungs 4 (four) times daily.      lisinopril (PRINIVIL,ZESTRIL) 40 MG tablet Take 1 tablet (40 mg) by mouth once daily as directed   PRN at PRN   loperamide (IMODIUM) 2 MG capsule Take 2 mg by mouth as needed for diarrhea or loose stools.   PRN at PRN   psyllium (METAMUCIL) 58.6 % packet Take 1 packet by mouth daily as needed.   PRN at PRN   Scheduled:  apixaban  5 mg Oral BID   budesonide  9 mg Oral Daily   diltiazem  180 mg Oral Daily   dofetilide  250 mcg Oral BID   pantoprazole  40 mg Oral Daily   rifaximin  550 mg Oral BID   vitamin B-12  1,000 mcg Oral Daily   Continuous:   IEP:PIRJJOACZYS, ipratropium, loperamide, metoprolol tartrate, ondansetron **OR** ondansetron (ZOFRAN) IV Anti-infectives (From admission, onward)    Start     Dose/Rate Route Frequency Ordered Stop   04/02/21 1145  rifaximin (XIFAXAN) tablet 550 mg         550 mg Oral 2 times daily 04/02/21 1046        Scheduled Meds:  apixaban  5 mg Oral BID   budesonide  9 mg Oral Daily   diltiazem  180 mg Oral Daily   dofetilide  250 mcg Oral BID   pantoprazole  40 mg Oral Daily   rifaximin  550 mg Oral BID   vitamin B-12  1,000 mcg Oral Daily   Continuous Infusions:   PRN Meds:.hydrALAZINE, ipratropium, loperamide, metoprolol tartrate, ondansetron **OR** ondansetron (ZOFRAN) IV   Assessment: Principal Problem:   Partial small bowel obstruction (HCC) Active Problems:   Coronary artery disease, non-occlusive   Episodic atrial fibrillation (HCC)   Bilateral hearing loss   Hiatal hernia with gastroesophageal reflux   Crohn's disease ([place])   B12 deficiency  Plan: Exacerbation of small bowel Crohn's presenting with partial small bowel obstruction: Non stricturing, nonpenetrating phenotype Resolved, patient has history of chronic diarrhea, recommend to check fecal calprotectin levels and pancreatic fecal elastase levels.  Okay to take Imodium as needed, discussed with patient about trial of lactose-free diet Switched from Solu-Medrol to budesonide 3 mg 3 pills daily due to patient and her son's s concern for steroid-induced psychosis.  She was experiencing hallucinations on 12/10.  Given her age, it is quite possible that she also has hospital induced delirium.  This has currently resolved.  Therefore, continue budesonide 3 mg 3 pills daily and follow-up with her primary GI Continue Xifaxan 550 mg p.o. twice daily for 2 weeks Continue B12 1000MCG daily Discussed with patient regarding importance of a soft diet long-term Follow-up with [facility] upon discharge  Tachycardia secondary to A. fib with RVR: Currently resolved Patient is seen by cardiology Continue Eliquis Patient is on beta-blocker and calcium channel blocker  Patient can be discharged home later today or tomorrow    LOS: 4 days   Kamille Toomey 04/04/2021, 2:54 PM

## 2021-04-04 NOTE — Discharge Summary (Signed)
Chelsey Williams SMO:[id number] DOB: 1927/06/21 DOA: 03/31/2021  PCP: Clarisse Gouge, MD  Admit date: 03/31/2021 Discharge date: 04/04/2021  Time spent: 45 minutes  Recommendations for Outpatient Follow-up:  PCP, Gastroenterology, and cardiology f/u    Discharge Diagnoses:  Principal Problem:   Partial small bowel obstruction ([place]) Active Problems:   Coronary artery disease, non-occlusive   Episodic atrial fibrillation ([place])   Bilateral hearing loss   Hiatal hernia with gastroesophageal reflux   Crohn's disease ([place])   B12 deficiency   Discharge Condition: stable  Diet recommendation: heart healthy  Filed Weights   03/31/21 0712  Weight: 72.1 kg    History of present illness:  Chelsey Williams is a 87 y.o. female with medical history significant for ??  Crohn's disease, macular degeneration, GERD, atrial fibrillation, coronary artery disease, status post pacemaker insertion for sinus node dysfunction who presents to the emergency room via EMS for evaluation of 1 day history of nausea, vomiting and periumbilical abdominal pain which she rated 10 x 10 in intensity at its worst. Abdominal pain is mostly in the periumbilical area and is described as a burning sensation and is nonradiating and has been associated with nausea and multiple episodes of bilious emesis.  Last bowel movement was 1 day prior to admission and she states that she had loose stools.  She takes Imodium almost on a daily basis for diarrhea. She has had cholecystectomy done in the past as well as polypectomy. She denies having any fever, no chills, no cough, no headache, no dizziness, no lightheadedness, no palpitations, no diaphoresis, no urinary frequency, no nocturia, no dysuria, no lower extremity swelling. Chart review shows patient had an EGD and colonoscopy in March 2022 which showed multiple ulcers in the distal ileum with relative sparing of the terminal ileum and also a potential area of narrowing and stenosis in  the distal ileum.  She was offered budesonide therapy which she declined due to concern for side effects.  She has a history of chronic diarrhea and takes Imodium daily as needed.  Hospital Course:  # Partial small bowel obstruction Likely 2/2 Crohn's flare. Abdominal pain much improved, no nausea/vomiting since PM of 12/8. Passing stool and flatus. Gen surg has signed off. GI is following. Tolerating diet. Is off IVF   # Crohn's disease Recent diagnosis - GI following - hx steroid-induced psychosis and did begin seeing things (snakes on ceiling) AM of 12/10. Refused further methylpred. GI is trying budesonide now as well as rifaximin w/ plan for 2 weeks of that. Delirium is resolved. Will need outpatient GI f/u.   # A-fib # History SA node dysfunction Has pacemaker, hx of cardioversion. Asymptomatic. Qtc 460 on admit. Runs of tachycardia to 150s and higher here - cont home tikosyn - cont eliquis - cards consult for tachycardia, advised starting dilt which has been started. Most recent hr wnl. Apparently history of swelling w/ CCB, will need to monitor for that.   # COPD Quiescent - home inhaler prn  # HTN Here bp wnl with initiation of diltiazem - hold home lisinopril - pcp f/u   # B12 deficiency Likely 2/2 crohn's - started oral tx  Procedures: none   Consultations: GI, cardiology  Discharge Exam: Vitals:   04/04/21 0821 04/04/21 1522  BP: (!) 157/75 124/82  Pulse: 73 91  Resp: 20 18  Temp: 98.7 F (37.1 C) 98.5 F (36.9 C)  SpO2: 95% 98%    General exam: Appears calm and comfortable  Respiratory system: Clear  to auscultation. Respiratory effort normal. Cardiovascular system: mod systolic murmur Gastrointestinal system: Abdomen is nondistended, soft and non-tender. No organomegaly or masses felt. Decreased bowel sounds throughout Central nervous system: Alert and oriented. No focal neurological deficits. Extremities: Symmetric 5 x 5 power. Skin: No rashes,  lesions or ulcers Psychiatry: Judgement and insight appear normal. Mood & affect appropriate.   Discharge Instructions   Discharge Instructions     Diet - low sodium heart healthy   Complete by: As directed    Increase activity slowly   Complete by: As directed       Allergies as of 04/04/2021       Reactions   Cephalexin Anaphylaxis   Shock / Unconsciousness   Diltiazem Hcl Swelling   Hydrochlorothiazide Other (See Comments)   dizziness dizziness   Povidone-iodine Itching   Prednisone Shortness Of Breath   SOB   Felodipine Nausea Only   Gastrointestinal problems, e.g., nausea, vomiting, diarrheaShock / Unconsciousnessmuscle tenderness   Magnesium-containing Compounds    Diarrhea   Other Other (See Comments)   A blood pressure med STEROIDS   Quinolones    Aortic aneurysm    Statins Other (See Comments)   Amlodipine Other (See Comments), Palpitations   Atenolol Palpitations   heart palpitations        Medication List     STOP taking these medications    lisinopril 40 MG tablet Commonly known as: ZESTRIL       TAKE these medications    acetaminophen 325 MG tablet Commonly known as: TYLENOL Take 325 mg by mouth every 4 (four) hours as needed.   albuterol 108 (90 Base) MCG/ACT inhaler Commonly known as: VENTOLIN HFA Inhale 2 puffs into the lungs every 6 (six) hours as needed for wheezing or shortness of breath.   apixaban 5 MG Tabs tablet Commonly known as: Eliquis Take 1 tablet (5 mg total) by mouth 2 (two) times daily.   budesonide 3 MG 24 hr capsule Commonly known as: ENTOCORT EC Take 3 capsules (9 mg total) by mouth daily. Start taking on: April 05, 2021   calcium carbonate 500 MG chewable tablet Commonly known as: TUMS - dosed in mg elemental calcium Chew 1 tablet by mouth every other day.   CALCIUM-VITAMIN D3 PO Take 1 each by mouth daily.   cholestyramine 4 g packet Commonly known as: QUESTRAN Take by mouth 2 (two) times  daily.   cyanocobalamin 1000 MCG tablet Take 1 tablet (1,000 mcg total) by mouth daily. Start taking on: April 05, 2021   diltiazem 180 MG 24 hr capsule Commonly known as: CARDIZEM CD Take 1 capsule (180 mg total) by mouth daily. Start taking on: April 05, 2021   dofetilide 250 MCG capsule Commonly known as: TIKOSYN Take 250 mcg by mouth 2 (two) times daily.   Eylea 2 MG/0.05ML Soln Generic drug: Aflibercept Apply to eye.   ferrous sulfate 325 (65 FE) MG tablet Take 1 tablet by mouth daily with breakfast.   Hyoscyamine Sulfate SL 0.125 MG Subl Place 0.125 mg under the tongue every 4 (four) hours as needed.   ipratropium 17 MCG/ACT inhaler Commonly known as: ATROVENT HFA Inhale 2 puffs into the lungs 4 (four) times daily.   Lecithin 1200 MG Caps Take by mouth.   loperamide 2 MG capsule Commonly known as: IMODIUM Take 2 mg by mouth as needed for diarrhea or loose stools.   magnesium (amino acid chelate) 133 MG tablet Take 2 tablets by mouth daily.   olopatadine  0.1 % ophthalmic solution Commonly known as: PATANOL Place 1 drop into the right eye 2 (two) times daily.   omeprazole 20 MG capsule Commonly known as: PRILOSEC Take 20 mg by mouth daily as needed.   potassium chloride SA 20 MEQ tablet Commonly known as: KLOR-CON M Take 20 mEq by mouth daily.   psyllium 58.6 % packet Commonly known as: METAMUCIL Take 1 packet by mouth daily as needed.   rifaximin 550 MG Tabs tablet Commonly known as: XIFAXAN Take 1 tablet (550 mg total) by mouth 2 (two) times daily for 12 days.       Allergies  Allergen Reactions   Cephalexin Anaphylaxis    Shock / Unconsciousness   Diltiazem Hcl Swelling   Hydrochlorothiazide Other (See Comments)    dizziness dizziness   Povidone-Iodine Itching   Prednisone Shortness Of Breath    SOB   Felodipine Nausea Only    Gastrointestinal problems, e.g., nausea, vomiting, diarrheaShock / Unconsciousnessmuscle tenderness    Magnesium-Containing Compounds     Diarrhea   Other Other (See Comments)    A blood pressure med STEROIDS   Quinolones     Aortic aneurysm    Statins Other (See Comments)   Amlodipine Other (See Comments) and Palpitations   Atenolol Palpitations    heart palpitations    Follow-up Information     Clarisse Gouge, MD Follow up.   Specialty: Family Medicine Contact information: [place] [address] [phone]         Deboraha Sprang, MD .   Specialty: Cardiology Contact information: [phone] [place] [address] [phone]         Your [facility] gastroenterologist Follow up.          Lin Landsman, MD Follow up.   Specialty: Gastroenterology Why: This is the contact information for the gastroenterologist who cared for you during this hospitalization, should you prefer to follow-up with her. Contact information: [place] [address] [phone]                  The results of significant diagnostics from this hospitalization (including imaging, microbiology, ancillary and laboratory) are listed below for reference.    Significant Diagnostic Studies: CT ABDOMEN PELVIS W CONTRAST  Result Date: 03/31/2021 CLINICAL DATA:  Acute generalized abdominal pain, nausea, vomiting. EXAM: CT ABDOMEN AND PELVIS WITH CONTRAST TECHNIQUE: Multidetector CT imaging of the abdomen and pelvis was performed using the standard protocol following bolus administration of intravenous contrast. CONTRAST:  163m OMNIPAQUE IOHEXOL 300 MG/ML  SOLN COMPARISON:  January 26, 2019. FINDINGS: Lower chest: Visualized lung bases are unremarkable. Moderate size sliding-type hiatal hernia is noted. Hepatobiliary: Status post cholecystectomy. Intrahepatic and extrahepatic biliary dilatation is noted which may be due to post cholecystectomy status. No focal liver abnormality is noted. Pancreas: Unremarkable.  No pancreatic ductal dilatation or surrounding inflammatory changes. Spleen: Normal in size without focal abnormality. Adrenals/Urinary Tract: Adrenal glands appear normal. Bilateral renal cysts. No hydronephrosis or renal obstruction is noted. Urinary bladder is unremarkable. Stomach/Bowel: The stomach and appendix are unremarkable. There is noted moderate small bowel dilatation with transition zone seen in the distal ileum in the right lower quadrant of the pelvis, best seen on image number 58 of series 2. Potentially this may be due to adhesion or possible focal inflammation involving this portion of the ileum. No colonic dilatation is noted. Vascular/Lymphatic: Aortic atherosclerosis. No enlarged abdominal or pelvic lymph  nodes. Reproductive: Uterus and bilateral adnexa are unremarkable. Other: No abdominal wall hernia or abnormality. No abdominopelvic ascites. Musculoskeletal: No acute or significant osseous findings. IMPRESSION: Moderate small bowel dilatation is noted with transition zone seen in the right lower quadrant involving the distal ileum, concerning for partial small bowel obstruction. It is uncertain if this is due to adhesion or possibly mild inflammatory changes involving this portion of the ileum. Moderate size sliding-type hiatal hernia is noted. Aortic Atherosclerosis (ICD10-I70.0). Electronically Signed   By: Marijo Conception M.D.   On: 03/31/2021 09:38    Microbiology: Recent Results (from the past 240 hour(s))  Resp Panel by RT-PCR (Flu A&B, Covid) Nasopharyngeal Swab     Status: None   Collection Time: 03/31/21  8:28 AM   Specimen: Nasopharyngeal Swab; Nasopharyngeal(NP) swabs in vial transport medium  Result Value Ref Range Status   SARS Coronavirus 2 by RT PCR NEGATIVE NEGATIVE Final    Comment: (NOTE) SARS-CoV-2 target nucleic acids are NOT DETECTED.  The SARS-CoV-2 RNA is generally detectable in upper respiratory specimens during the acute phase of infection. The  lowest concentration of SARS-CoV-2 viral copies this assay can detect is 138 copies/mL. A negative result does not preclude SARS-Cov-2 infection and should not be used as the sole basis for treatment or other patient management decisions. A negative result may occur with  improper specimen collection/handling, submission of specimen other than nasopharyngeal swab, presence of viral mutation(s) within the areas targeted by this assay, and inadequate number of viral copies(<138 copies/mL). A negative result must be combined with clinical observations, patient history, and epidemiological information. The expected result is Negative.  Fact Sheet for Patients:  EntrepreneurPulse.com.au  Fact Sheet for Healthcare Providers:  IncredibleEmployment.be  This test is no t yet approved or cleared by the [place] FDA and  has been authorized for detection and/or diagnosis of SARS-CoV-2 by FDA under an Emergency Use Authorization (EUA). This EUA will remain  in effect (meaning this test can be used) for the duration of the COVID-19 declaration under Section 564(b)(1) of the Act, 21 U.S.C.section 360bbb-3(b)(1), unless the authorization is terminated  or revoked sooner.       Influenza A by PCR NEGATIVE NEGATIVE Final   Influenza B by PCR NEGATIVE NEGATIVE Final    Comment: (NOTE) The Xpert Xpress SARS-CoV-2/FLU/RSV plus assay is intended as an aid in the diagnosis of influenza from Nasopharyngeal swab specimens and should not be used as a sole basis for treatment. Nasal washings and aspirates are unacceptable for Xpert Xpress SARS-CoV-2/FLU/RSV testing.  Fact Sheet for Patients: EntrepreneurPulse.com.au  Fact Sheet for Healthcare Providers: IncredibleEmployment.be  This test is not yet approved or cleared by the [place] FDA and has been authorized for detection and/or diagnosis of SARS-CoV-2 by FDA under  an Emergency Use Authorization (EUA). This EUA will remain in effect (meaning this test can be used) for the duration of the COVID-19 declaration under Section 564(b)(1) of the Act, 21 U.S.C. section 360bbb-3(b)(1), unless the authorization is terminated or revoked.  Performed at [facility], [address] [place], [place] [address]      Labs: Basic Metabolic Panel: Recent Labs  Lab 03/31/21 0715 04/01/21 1058 04/02/21 0637 04/03/21 0552 04/04/21 0228  NA 134* 135 140 138 140  K 3.7 3.8 3.8 3.5 3.8  CL 100 105 108 103 109  CO2 26 24 25 25 25   GLUCOSE 169* 112* 136* 110* 103*  BUN 20 22 17 14 16   CREATININE 0.54  0.66 0.49 0.56 0.72  CALCIUM 9.1 8.1* 8.4* 9.0 8.8*  MG 2.0  --  2.2  --   --    Liver Function Tests: Recent Labs  Lab 03/31/21 0715  AST 31  ALT 17  ALKPHOS 64  BILITOT 0.6  PROT 7.2  ALBUMIN 3.8   Recent Labs  Lab 03/31/21 0715  LIPASE 35   No results for input(s): AMMONIA in the last 168 hours. CBC: Recent Labs  Lab 03/31/21 0715 04/01/21 1058 04/02/21 0637 04/04/21 0228  WBC 14.6* 8.8 9.8 8.1  HGB 14.0 11.5* 11.3* 12.6  HCT 42.6 35.1* 35.4* 39.4  MCV 89.5 90.0 89.2 90.0  PLT 320 270 292 306   Cardiac Enzymes: No results for input(s): CKTOTAL, CKMB, CKMBINDEX, TROPONINI in the last 168 hours. BNP: BNP (last 3 results) No results for input(s): BNP in the last 8760 hours.  ProBNP (last 3 results) No results for input(s): PROBNP in the last 8760 hours.  CBG: No results for input(s): GLUCAP in the last 168 hours.     Signed:  Desma Maxim MD.  [facility] 04/04/2021, 4:31 PM

## 2021-04-04 NOTE — Progress Notes (Signed)
PROGRESS NOTE    Chelsey Williams  UDJ:[id number] DOB: 01-04-1928 DOA: 03/31/2021 PCP: Clarisse Gouge, MD  Outpatient Specialists: GI    Brief Narrative:   Chelsey Williams is a 87 y.o. female with medical history significant for ??  Crohn's disease, macular degeneration, GERD, atrial fibrillation, coronary artery disease, status post pacemaker insertion for sinus node dysfunction who presents to the emergency room via EMS for evaluation of 1 day history of nausea, vomiting and periumbilical abdominal pain which she rated 10 x 10 in intensity at its worst. Abdominal pain is mostly in the periumbilical area and is described as a burning sensation and is nonradiating and has been associated with nausea and multiple episodes of bilious emesis.  Last bowel movement was 1 day prior to admission and she states that she had loose stools.  She takes Imodium almost on a daily basis for diarrhea. She has had cholecystectomy done in the past as well as polypectomy. She denies having any fever, no chills, no cough, no headache, no dizziness, no lightheadedness, no palpitations, no diaphoresis, no urinary frequency, no nocturia, no dysuria, no lower extremity swelling. Chart review shows patient had an EGD and colonoscopy in March 2022 which showed multiple ulcers in the distal ileum with relative sparing of the terminal ileum and also a potential area of narrowing and stenosis in the distal ileum.  She was offered budesonide therapy which she declined due to concern for side effects.  She has a history of chronic diarrhea and takes Imodium daily as needed.   Assessment & Plan:   Principal Problem:   Partial small bowel obstruction (HCC) Active Problems:   Coronary artery disease, non-occlusive   Episodic atrial fibrillation (HCC)   Bilateral hearing loss   Hiatal hernia with gastroesophageal reflux   Crohn's disease ([place])   B12 deficiency  # Partial small bowel obstruction Likely 2/2 Crohn's flare.  Abdominal pain much improved, no nausea/vomiting since PM of 12/8. Passing stool and flatus. Gen surg has signed off. GI is following. Tolerating diet. Is off IVF - cont soft diet - treating crohn's flare as below - PT says no HH need  # Crohn's disease Recent diagnosis - GI following - hx steroid-induced psychosis and did begin seeing things (snakes on ceiling) AM of 12/10. Refused further methylpred. GI is trying budesonide now as well as rifaximin w/ plan for 2 weeks of that. Delirium is resolved.  # A-fib # History SA node dysfunction Has pacemaker, hx of cardioversion. Asymptomatic. Qtc 460 on admit. Runs of tachycardia to 150s and higher - pharmacy dosing potassium to maintain around 4 - cont home tikosyn - cont eliquis - cards consult for tachycardia, advised starting dilt which has been started. Most recent hr wnl  # COPD Quiescent - home inhaler prn  # B12 deficiency - started oral tx  DVT prophylaxis: eliquis Code Status: full Family Communication: son updated telephonically 12/11  Level of care: Telemetry Medical Status is: Inpatient  Remains inpatient appropriate because: severity of illness      Consultants:  Gi, gen surg  Procedures: none  Antimicrobials:  none    Subjective: Episode of abdominal pain and loose stool today, abd pain resolved.  Objective: Vitals:   04/03/21 1832 04/03/21 2125 04/04/21 0408 04/04/21 0821  BP: (!) 130/95 118/80 122/79 (!) 157/75  Pulse: (!) 107 70 79 73  Resp: 14 18  20   Temp: 98.5 F (36.9 C) 98.4 F (36.9 C) 98.3 F (36.8 C) 98.7 F (37.1 C)  TempSrc: Oral   Oral  SpO2: 96% 95% 95% 95%  Weight:      Height:        Intake/Output Summary (Last 24 hours) at 04/04/2021 1034 Last data filed at 04/04/2021 1011 Gross per 24 hour  Intake 322.94 ml  Output 801 ml  Net -478.06 ml   Filed Weights   03/31/21 0712  Weight: 72.1 kg    Examination:  General exam: Appears calm and comfortable   Respiratory system: Clear to auscultation. Respiratory effort normal. Cardiovascular system: mod systolic murmur Gastrointestinal system: Abdomen is nondistended, soft and non-tender. No organomegaly or masses felt. Decreased bowel sounds throughout Central nervous system: Alert and oriented. No focal neurological deficits. Extremities: Symmetric 5 x 5 power. Skin: No rashes, lesions or ulcers Psychiatry: Judgement and insight appear normal. Mood & affect appropriate.     Data Reviewed: I have personally reviewed following labs and imaging studies  CBC: Recent Labs  Lab 03/31/21 0715 04/01/21 1058 04/02/21 0637 04/04/21 0228  WBC 14.6* 8.8 9.8 8.1  HGB 14.0 11.5* 11.3* 12.6  HCT 42.6 35.1* 35.4* 39.4  MCV 89.5 90.0 89.2 90.0  PLT 320 270 292 915   Basic Metabolic Panel: Recent Labs  Lab 03/31/21 0715 04/01/21 1058 04/02/21 0637 04/03/21 0552 04/04/21 0228  NA 134* 135 140 138 140  K 3.7 3.8 3.8 3.5 3.8  CL 100 105 108 103 109  CO2 26 24 25 25 25   GLUCOSE 169* 112* 136* 110* 103*  BUN 20 22 17 14 16   CREATININE 0.54 0.66 0.49 0.56 0.72  CALCIUM 9.1 8.1* 8.4* 9.0 8.8*  MG 2.0  --  2.2  --   --    GFR: Estimated Creatinine Clearance: 43.6 mL/min (by C-G formula based on SCr of 0.72 mg/dL). Liver Function Tests: Recent Labs  Lab 03/31/21 0715  AST 31  ALT 17  ALKPHOS 64  BILITOT 0.6  PROT 7.2  ALBUMIN 3.8   Recent Labs  Lab 03/31/21 0715  LIPASE 35   No results for input(s): AMMONIA in the last 168 hours. Coagulation Profile: No results for input(s): INR, PROTIME in the last 168 hours. Cardiac Enzymes: No results for input(s): CKTOTAL, CKMB, CKMBINDEX, TROPONINI in the last 168 hours. BNP (last 3 results) No results for input(s): PROBNP in the last 8760 hours. HbA1C: No results for input(s): HGBA1C in the last 72 hours. CBG: No results for input(s): GLUCAP in the last 168 hours. Lipid Profile: No results for input(s): CHOL, HDL, LDLCALC, TRIG,  CHOLHDL, LDLDIRECT in the last 72 hours. Thyroid Function Tests: No results for input(s): TSH, T4TOTAL, FREET4, T3FREE, THYROIDAB in the last 72 hours. Anemia Panel: Recent Labs    04/01/21 1340 04/03/21 0552  VITAMINB12 152*  --   FOLATE  --  38.0  FERRITIN  --  62  TIBC  --  396  IRON  --  42   Urine analysis:    Component Value Date/Time   COLORURINE YELLOW (A) 06/12/2018 1233   APPEARANCEUR CLEAR (A) 06/12/2018 1233   APPEARANCEUR Clear 04/03/2012 1431   LABSPEC >1.046 (H) 06/12/2018 1233   LABSPEC 1.005 04/03/2012 1431   PHURINE 5.0 06/12/2018 1233   GLUCOSEU NEGATIVE 06/12/2018 1233   GLUCOSEU Negative 04/03/2012 1431   HGBUR SMALL (A) 06/12/2018 1233   BILIRUBINUR NEGATIVE 06/12/2018 1233   BILIRUBINUR Negative 04/03/2012 1431   KETONESUR 20 (A) 06/12/2018 1233   PROTEINUR NEGATIVE 06/12/2018 1233   NITRITE NEGATIVE 06/12/2018 1233   LEUKOCYTESUR NEGATIVE  06/12/2018 1233   LEUKOCYTESUR Negative 04/03/2012 1431   Sepsis Labs: @LABRCNTIP (procalcitonin:4,lacticidven:4)  ) Recent Results (from the past 240 hour(s))  Resp Panel by RT-PCR (Flu A&B, Covid) Nasopharyngeal Swab     Status: None   Collection Time: 03/31/21  8:28 AM   Specimen: Nasopharyngeal Swab; Nasopharyngeal(NP) swabs in vial transport medium  Result Value Ref Range Status   SARS Coronavirus 2 by RT PCR NEGATIVE NEGATIVE Final    Comment: (NOTE) SARS-CoV-2 target nucleic acids are NOT DETECTED.  The SARS-CoV-2 RNA is generally detectable in upper respiratory specimens during the acute phase of infection. The lowest concentration of SARS-CoV-2 viral copies this assay can detect is 138 copies/mL. A negative result does not preclude SARS-Cov-2 infection and should not be used as the sole basis for treatment or other patient management decisions. A negative result may occur with  improper specimen collection/handling, submission of specimen other than nasopharyngeal swab, presence of viral  mutation(s) within the areas targeted by this assay, and inadequate number of viral copies(<138 copies/mL). A negative result must be combined with clinical observations, patient history, and epidemiological information. The expected result is Negative.  Fact Sheet for Patients:  EntrepreneurPulse.com.au  Fact Sheet for Healthcare Providers:  IncredibleEmployment.be  This test is no t yet approved or cleared by the [place] FDA and  has been authorized for detection and/or diagnosis of SARS-CoV-2 by FDA under an Emergency Use Authorization (EUA). This EUA will remain  in effect (meaning this test can be used) for the duration of the COVID-19 declaration under Section 564(b)(1) of the Act, 21 U.S.C.section 360bbb-3(b)(1), unless the authorization is terminated  or revoked sooner.       Influenza A by PCR NEGATIVE NEGATIVE Final   Influenza B by PCR NEGATIVE NEGATIVE Final    Comment: (NOTE) The Xpert Xpress SARS-CoV-2/FLU/RSV plus assay is intended as an aid in the diagnosis of influenza from Nasopharyngeal swab specimens and should not be used as a sole basis for treatment. Nasal washings and aspirates are unacceptable for Xpert Xpress SARS-CoV-2/FLU/RSV testing.  Fact Sheet for Patients: EntrepreneurPulse.com.au  Fact Sheet for Healthcare Providers: IncredibleEmployment.be  This test is not yet approved or cleared by the [place] FDA and has been authorized for detection and/or diagnosis of SARS-CoV-2 by FDA under an Emergency Use Authorization (EUA). This EUA will remain in effect (meaning this test can be used) for the duration of the COVID-19 declaration under Section 564(b)(1) of the Act, 21 U.S.C. section 360bbb-3(b)(1), unless the authorization is terminated or revoked.  Performed at [facility], [address], [place], [place] [address]          Radiology  Studies: No results found.      Scheduled Meds:  apixaban  5 mg Oral BID   budesonide  9 mg Oral Daily   diltiazem  180 mg Oral Daily   dofetilide  250 mcg Oral BID   pantoprazole  40 mg Oral Daily   rifaximin  550 mg Oral BID   vitamin B-12  1,000 mcg Oral Daily   Continuous Infusions:     LOS: 4 days    Time spent: 25 min    Desma Maxim, MD [facility]   If 7PM-7AM, please contact night-coverage [email or web address] Password [facility] 04/04/2021, 10:34 AM

## 2021-04-04 NOTE — Progress Notes (Signed)
Pharmacy: Dofetilide (Tikosyn) - Follow Up Assessment and Electrolyte Replacement  Pharmacy consulted to assist in monitoring and replacing electrolytes in this 87 y.o. female admitted on 03/31/2021 undergoing dofetilide re-initiation. First dofetilide dose: 04/02/21 2147  Labs:    Component Value Date/Time   K 3.8 04/04/2021 0228   MG 2.2 04/02/2021 8549     Plan:   Potassium: K 3.5:  Give Kcl 32mq PO x 1 dose   Magnesium: Mg > 2: No additional supplementation needed   Thank you for allowing pharmacy to participate in this patient's care   RDallie Piles12/03/2021  7:43 AM

## 2021-04-04 NOTE — TOC CM/SW Note (Signed)
Transition of Care (TOC) Screening Note   Patient Details  Name: Chelsey Williams Date of Birth: [birth year]   Transition of Care [facility]) CM/SW Contact:    Candie Chroman, LCSW Phone Number: 04/04/2021, 8:50 AM    Transition of Care Department [facility]) has reviewed patient and no TOC needs have been identified at this time. We will continue to monitor patient advancement through interdisciplinary progression rounds. If new patient transition needs arise, please place a TOC consult.

## 2021-04-04 NOTE — Progress Notes (Signed)
[facility] Cardiology  SUBJECTIVE: Patient laying in bed, denies palpitations or heart racing   Vitals:   04/03/21 0737 04/03/21 1832 04/03/21 2125 04/04/21 0408  BP: 116/85 (!) 130/95 118/80 122/79  Pulse: 86 (!) 107 70 79  Resp: 14 14 18    Temp: 97.7 F (36.5 C) 98.5 F (36.9 C) 98.4 F (36.9 C) 98.3 F (36.8 C)  TempSrc: Oral Oral    SpO2: 95% 96% 95% 95%  Weight:      Height:         Intake/Output Summary (Last 24 hours) at 04/04/2021 1638 Last data filed at 04/03/2021 1558 Gross per 24 hour  Intake 82.94 ml  Output 801 ml  Net -718.06 ml      PHYSICAL EXAM  General: Well developed, well nourished, in no acute distress HEENT:  Normocephalic and atramatic Neck:  No JVD.  Lungs: Clear bilaterally to auscultation and percussion. Heart: HRRR . Normal S1 and S2 without gallops or murmurs.  Abdomen: Bowel sounds are positive, abdomen soft and non-tender  Msk:  Back normal, normal gait. Normal strength and tone for age. Extremities: No clubbing, cyanosis or edema.   Neuro: Alert and oriented X 3. Psych:  Good affect, responds appropriately   LABS: Basic Metabolic Panel: Recent Labs    04/02/21 0637 04/03/21 0552 04/04/21 0228  NA 140 138 140  K 3.8 3.5 3.8  CL 108 103 109  CO2 25 25 25   GLUCOSE 136* 110* 103*  BUN 17 14 16   CREATININE 0.49 0.56 0.72  CALCIUM 8.4* 9.0 8.8*  MG 2.2  --   --    Liver Function Tests: No results for input(s): AST, ALT, ALKPHOS, BILITOT, PROT, ALBUMIN in the last 72 hours. No results for input(s): LIPASE, AMYLASE in the last 72 hours. CBC: Recent Labs    04/02/21 0637 04/04/21 0228  WBC 9.8 8.1  HGB 11.3* 12.6  HCT 35.4* 39.4  MCV 89.2 90.0  PLT 292 306   Cardiac Enzymes: No results for input(s): CKTOTAL, CKMB, CKMBINDEX, TROPONINI in the last 72 hours. BNP: Invalid input(s): POCBNP D-Dimer: No results for input(s): DDIMER in the last 72 hours. Hemoglobin A1C: No results for input(s): HGBA1C in the last 72  hours. Fasting Lipid Panel: No results for input(s): CHOL, HDL, LDLCALC, TRIG, CHOLHDL, LDLDIRECT in the last 72 hours. Thyroid Function Tests: No results for input(s): TSH, T4TOTAL, T3FREE, THYROIDAB in the last 72 hours.  Invalid input(s): FREET3 Anemia Panel: Recent Labs    04/01/21 1340 04/03/21 0552  VITAMINB12 152*  --   FOLATE  --  38.0  FERRITIN  --  62  TIBC  --  396  IRON  --  42    No results found.   Echo   TELEMETRY: Atrial pacing with ventricular sensing 77 bpm:  ASSESSMENT AND PLAN:  Principal Problem:   Partial small bowel obstruction (HCC) Active Problems:   Coronary artery disease, non-occlusive   Episodic atrial fibrillation (HCC)   Bilateral hearing loss   Hiatal hernia with gastroesophageal reflux   Crohn's disease ([place])   B12 deficiency    1. Paroxysmal atrial fibrillation, on Eliquis for stroke prevention, and Tikosyn for rhythm control.  Patient admitted with partial small bowel obstruction, experiencing episodes of fibrillation with rapid ventricular rate, mildly symptomatic, improved on diltiazem 60 mg every 8 hours 2.  Sick sinus syndrome, status post dual-chamber pacemaker 3.  Partial small bowel obstruction, secondary to Crohn's exacerbation, clinically improving 4.  Mild aortic stenosis  Recommendations   1.  Agree with current therapy 2.  Continue Eliquis for stroke prevention 3.  Continue Tikosyn for rhythm control 4.  Transition diltiazem to Cardizem CD 180 mg daily   Isaias Cowman, MD, PhD, [facility] 04/04/2021 8:14 AM

## 2021-04-04 NOTE — Progress Notes (Signed)
Physical Therapy Treatment Patient Details Name: Chelsey Williams MRN: [id number] DOB: May 01, 1927 Today's Date: 04/04/2021   History of Present Illness Chelsey Williams is a 29yoF who comes to [facility] 12/8 c 1d N/V and periumbilical pain. PMH: Crohn's disease, macular degeneration, GERD, atrial fibrillation, coronary artery disease, status post pacemaker insertion for sinus node dysfunction. Pt admitted with partial SBO.    PT Comments    Patient was up to chair on arrival to room. Patient agreeable to LE exercises for strengthening and was able to perform with cues. She declined walking in the hallway today due to bouts of diarrhea earlier. She is eager to be discharged home soon with the assistance from her son. Recommend to continue PT to maximize independence in preparation for discharge home with family support.    Recommendations for follow up therapy are one component of a multi-disciplinary discharge planning process, led by the attending physician.  Recommendations may be updated based on patient status, additional functional criteria and insurance authorization.  Follow Up Recommendations  No PT follow up     Assistance Recommended at Discharge PRN  Equipment Recommendations  None recommended by PT    Recommendations for Other Services       Precautions / Restrictions Precautions Precautions: Fall Restrictions Weight Bearing Restrictions: No     Mobility  Bed Mobility               General bed mobility comments: not addressed this session as patient sitting up on arrival to room and post session    Transfers                   General transfer comment: patient declined    Ambulation/Gait               General Gait Details: patient reports she wants to walk but does not feel that she can due to diarrhea   Stairs             Wheelchair Mobility    Modified Rankin (Stroke Patients Only)       Balance                                             Cognition Arousal/Alertness: Awake/alert Behavior During Therapy: Restless;WFL for tasks assessed/performed Overall Cognitive Status: Within Functional Limits for tasks assessed                                 General Comments: easily distracted and needs cues for attention to task.        Exercises General Exercises - Lower Extremity Ankle Circles/Pumps: AROM;Strengthening;5 reps;Seated Long Arc Quad: AROM;Strengthening;Both;5 reps;Seated (intermittent AAROM for RLE due to chronic knee issues) Hip Flexion/Marching: AROM;Strengthening;Both;5 reps;Seated Toe Raises: AROM;Strengthening;Both;5 reps;Seated Heel Raises: AROM;Strengthening;Both;Seated Other Exercises Other Exercises: patient easily distracted and needs cues for attention to task with exercises. verbal cues for technique for strengthening    General Comments        Pertinent Vitals/Pain Pain Assessment: No/denies pain    Home Living                          Prior Function            PT Goals (current goals can now be found in the care  plan section) Acute Rehab PT Goals Patient Stated Goal: to walk, go home PT Goal Formulation: With patient Time For Goal Achievement: 04/15/21 Potential to Achieve Goals: Good Progress towards PT goals: Progressing toward goals    Frequency    Min 2X/week      PT Plan Current plan remains appropriate    Co-evaluation              AM-PAC PT "6 Clicks" Mobility   Outcome Measure  Help needed turning from your back to your side while in a flat bed without using bedrails?: None Help needed moving from lying on your back to sitting on the side of a flat bed without using bedrails?: None Help needed moving to and from a bed to a chair (including a wheelchair)?: A Little Help needed standing up from a chair using your arms (e.g., wheelchair or bedside chair)?: A Little Help needed to walk in hospital room?: A  Little Help needed climbing 3-5 steps with a railing? : A Little 6 Click Score: 20    End of Session   Activity Tolerance: Patient tolerated treatment well Patient left: in chair;with call bell/phone within reach;with chair alarm set   PT Visit Diagnosis: Unsteadiness on feet (R26.81);Difficulty in walking, not elsewhere classified (R26.2);Muscle weakness (generalized) (M62.81)     Time: 9357-0177 PT Time Calculation (min) (ACUTE ONLY): 18 min  Charges:  $Therapeutic Exercise: 8-22 mins                     Minna Merritts, PT, MPT    Percell Locus 04/04/2021, 2:06 PM

## 2021-04-06 ENCOUNTER — Telehealth: Payer: Self-pay | Admitting: Internal Medicine

## 2021-04-06 NOTE — Telephone Encounter (Signed)
Patient changed providers deleted recall.

## 2021-04-07 ENCOUNTER — Encounter: Payer: Medicare Other | Admitting: Internal Medicine

## 2021-04-22 ENCOUNTER — Telehealth: Payer: Self-pay

## 2021-04-22 NOTE — Telephone Encounter (Signed)
Called pt's son Aaron Edelman and LVM for him to call back

## 2021-05-12 ENCOUNTER — Other Ambulatory Visit: Payer: Self-pay | Admitting: Obstetrics and Gynecology

## 2021-05-13 DIAGNOSIS — M316 Other giant cell arteritis: Secondary | ICD-10-CM | POA: Insufficient documentation

## 2021-06-10 ENCOUNTER — Other Ambulatory Visit: Payer: Self-pay

## 2021-06-10 ENCOUNTER — Inpatient Hospital Stay
Admission: EM | Admit: 2021-06-10 | Discharge: 2021-06-12 | DRG: 291 | Disposition: A | Discharge status: Home Health | Payer: Medicare Other | Attending: Internal Medicine | Admitting: Internal Medicine

## 2021-06-10 ENCOUNTER — Emergency Department: Payer: Medicare Other

## 2021-06-10 ENCOUNTER — Encounter: Payer: Self-pay | Admitting: Emergency Medicine

## 2021-06-10 DIAGNOSIS — Z8249 Family history of ischemic heart disease and other diseases of the circulatory system: Secondary | ICD-10-CM

## 2021-06-10 DIAGNOSIS — Z9049 Acquired absence of other specified parts of digestive tract: Secondary | ICD-10-CM

## 2021-06-10 DIAGNOSIS — S29001A Unspecified injury of muscle and tendon of front wall of thorax, initial encounter: Secondary | ICD-10-CM | POA: Diagnosis present

## 2021-06-10 DIAGNOSIS — K50012 Crohn's disease of small intestine with intestinal obstruction: Secondary | ICD-10-CM | POA: Diagnosis not present

## 2021-06-10 DIAGNOSIS — J449 Chronic obstructive pulmonary disease, unspecified: Secondary | ICD-10-CM | POA: Diagnosis present

## 2021-06-10 DIAGNOSIS — D509 Iron deficiency anemia, unspecified: Secondary | ICD-10-CM | POA: Diagnosis present

## 2021-06-10 DIAGNOSIS — I495 Sick sinus syndrome: Secondary | ICD-10-CM | POA: Diagnosis present

## 2021-06-10 DIAGNOSIS — T462X5A Adverse effect of other antidysrhythmic drugs, initial encounter: Secondary | ICD-10-CM | POA: Diagnosis not present

## 2021-06-10 DIAGNOSIS — Z87891 Personal history of nicotine dependence: Secondary | ICD-10-CM

## 2021-06-10 DIAGNOSIS — E785 Hyperlipidemia, unspecified: Secondary | ICD-10-CM | POA: Diagnosis present

## 2021-06-10 DIAGNOSIS — I480 Paroxysmal atrial fibrillation: Secondary | ICD-10-CM | POA: Diagnosis present

## 2021-06-10 DIAGNOSIS — K219 Gastro-esophageal reflux disease without esophagitis: Secondary | ICD-10-CM | POA: Diagnosis present

## 2021-06-10 DIAGNOSIS — Z20822 Contact with and (suspected) exposure to covid-19: Secondary | ICD-10-CM | POA: Diagnosis present

## 2021-06-10 DIAGNOSIS — Z888 Allergy status to other drugs, medicaments and biological substances status: Secondary | ICD-10-CM

## 2021-06-10 DIAGNOSIS — D519 Vitamin B12 deficiency anemia, unspecified: Secondary | ICD-10-CM | POA: Diagnosis present

## 2021-06-10 DIAGNOSIS — I4819 Other persistent atrial fibrillation: Secondary | ICD-10-CM | POA: Diagnosis present

## 2021-06-10 DIAGNOSIS — Y92239 Unspecified place in hospital as the place of occurrence of the external cause: Secondary | ICD-10-CM | POA: Diagnosis not present

## 2021-06-10 DIAGNOSIS — Z950 Presence of cardiac pacemaker: Secondary | ICD-10-CM

## 2021-06-10 DIAGNOSIS — K5090 Crohn's disease, unspecified, without complications: Secondary | ICD-10-CM | POA: Diagnosis present

## 2021-06-10 DIAGNOSIS — H9193 Unspecified hearing loss, bilateral: Secondary | ICD-10-CM | POA: Diagnosis present

## 2021-06-10 DIAGNOSIS — I110 Hypertensive heart disease with heart failure: Secondary | ICD-10-CM | POA: Diagnosis present

## 2021-06-10 DIAGNOSIS — R609 Edema, unspecified: Secondary | ICD-10-CM | POA: Diagnosis not present

## 2021-06-10 DIAGNOSIS — R0602 Shortness of breath: Secondary | ICD-10-CM | POA: Diagnosis present

## 2021-06-10 DIAGNOSIS — G9389 Other specified disorders of brain: Secondary | ICD-10-CM | POA: Diagnosis not present

## 2021-06-10 DIAGNOSIS — I2510 Atherosclerotic heart disease of native coronary artery without angina pectoris: Secondary | ICD-10-CM | POA: Diagnosis present

## 2021-06-10 DIAGNOSIS — I248 Other forms of acute ischemic heart disease: Secondary | ICD-10-CM | POA: Diagnosis present

## 2021-06-10 DIAGNOSIS — Z79899 Other long term (current) drug therapy: Secondary | ICD-10-CM | POA: Diagnosis not present

## 2021-06-10 DIAGNOSIS — R200 Anesthesia of skin: Secondary | ICD-10-CM | POA: Diagnosis not present

## 2021-06-10 DIAGNOSIS — Z823 Family history of stroke: Secondary | ICD-10-CM | POA: Diagnosis not present

## 2021-06-10 DIAGNOSIS — Z885 Allergy status to narcotic agent status: Secondary | ICD-10-CM | POA: Diagnosis not present

## 2021-06-10 DIAGNOSIS — W01198A Fall on same level from slipping, tripping and stumbling with subsequent striking against other object, initial encounter: Secondary | ICD-10-CM | POA: Diagnosis not present

## 2021-06-10 DIAGNOSIS — R52 Pain, unspecified: Secondary | ICD-10-CM

## 2021-06-10 DIAGNOSIS — I5033 Acute on chronic diastolic (congestive) heart failure: Secondary | ICD-10-CM | POA: Diagnosis present

## 2021-06-10 DIAGNOSIS — Z7901 Long term (current) use of anticoagulants: Secondary | ICD-10-CM

## 2021-06-10 DIAGNOSIS — I509 Heart failure, unspecified: Secondary | ICD-10-CM

## 2021-06-10 LAB — CBC
HCT: 36.5 % (ref 36.0–46.0)
Hemoglobin: 11.6 g/dL — ABNORMAL LOW (ref 12.0–15.0)
MCH: 28.9 pg (ref 26.0–34.0)
MCHC: 31.8 g/dL (ref 30.0–36.0)
MCV: 90.8 fL (ref 80.0–100.0)
Platelets: 301 10*3/uL (ref 150–400)
RBC: 4.02 MIL/uL (ref 3.87–5.11)
RDW: 15 % (ref 11.5–15.5)
WBC: 6.5 10*3/uL (ref 4.0–10.5)
nRBC: 0 % (ref 0.0–0.2)

## 2021-06-10 LAB — BASIC METABOLIC PANEL
Anion gap: 5 (ref 5–15)
BUN: 17 mg/dL (ref 8–23)
CO2: 28 mmol/L (ref 22–32)
Calcium: 8.6 mg/dL — ABNORMAL LOW (ref 8.9–10.3)
Chloride: 102 mmol/L (ref 98–111)
Creatinine, Ser: 0.72 mg/dL (ref 0.44–1.00)
GFR, Estimated: >60 mL/min (ref >60)
Glucose, Bld: 132 mg/dL — ABNORMAL HIGH (ref 70–99)
Potassium: 3.5 mmol/L (ref 3.5–5.1)
Sodium: 135 mmol/L (ref 135–145)

## 2021-06-10 LAB — BRAIN NATRIURETIC PEPTIDE: B Natriuretic Peptide: 107.7 pg/mL — ABNORMAL HIGH (ref 0.0–100.0)

## 2021-06-10 LAB — TSH: TSH: 2.373 u[IU]/mL (ref 0.350–4.500)

## 2021-06-10 LAB — CK: Total CK: 127 U/L (ref 38–234)

## 2021-06-10 LAB — RESP PANEL BY RT-PCR (FLU A&B, COVID) ARPGX2
Influenza A by PCR: NEGATIVE
Influenza B by PCR: NEGATIVE
SARS Coronavirus 2 by RT PCR: NEGATIVE

## 2021-06-10 LAB — TROPONIN I (HIGH SENSITIVITY)
Troponin I (High Sensitivity): 37 ng/L — ABNORMAL HIGH (ref <18)
Troponin I (High Sensitivity): 38 ng/L — ABNORMAL HIGH (ref <18)

## 2021-06-10 MED ORDER — SODIUM CHLORIDE 0.9% FLUSH
3.0000 mL | INTRAVENOUS | Status: DC | PRN
Start: 2021-06-10 — End: 2021-06-12

## 2021-06-10 MED ORDER — FUROSEMIDE 10 MG/ML IJ SOLN
80.0000 mg | Freq: Once | INTRAMUSCULAR | Status: AC
  Administered 2021-06-10: 80 mg via INTRAVENOUS
  Filled 2021-06-10: qty 8

## 2021-06-10 MED ORDER — ACETAMINOPHEN 325 MG PO TABS: 325.0000 mg | ORAL_TABLET | ORAL | Status: DC | PRN

## 2021-06-10 MED ORDER — MAGNESIUM OXIDE -MG SUPPLEMENT 400 (240 MG) MG PO TABS
400.0000 mg | ORAL_TABLET | Freq: Every day | ORAL | Status: DC
  Filled 2021-06-10: qty 1

## 2021-06-10 MED ORDER — ACETAMINOPHEN 325 MG PO TABS
650.0000 mg | ORAL_TABLET | ORAL | Status: DC | PRN
  Administered 2021-06-10 (×2): 650 mg via ORAL
  Filled 2021-06-10 (×2): qty 2

## 2021-06-10 MED ORDER — DIPHENHYDRAMINE HCL 50 MG/ML IJ SOLN
25.0000 mg | Freq: Once | INTRAMUSCULAR | Status: AC
  Administered 2021-06-10: 25 mg via INTRAVENOUS

## 2021-06-10 MED ORDER — PSYLLIUM 95 % PO PACK
1.0000 | PACK | Freq: Every day | ORAL | Status: DC | PRN
  Filled 2021-06-10: qty 1

## 2021-06-10 MED ORDER — PANTOPRAZOLE SODIUM 40 MG PO TBEC
40.0000 mg | DELAYED_RELEASE_TABLET | Freq: Every day | ORAL | Status: DC
  Administered 2021-06-10 – 2021-06-12 (×3): 40 mg via ORAL
  Filled 2021-06-10 (×3): qty 1

## 2021-06-10 MED ORDER — LOPERAMIDE HCL 2 MG PO CAPS: 2.0000 mg | ORAL_CAPSULE | ORAL | Status: DC | PRN

## 2021-06-10 MED ORDER — FUROSEMIDE 10 MG/ML IJ SOLN
40.0000 mg | Freq: Every day | INTRAMUSCULAR | Status: DC
  Administered 2021-06-11: 40 mg via INTRAVENOUS
  Filled 2021-06-10 (×2): qty 4

## 2021-06-10 MED ORDER — FERROUS SULFATE 325 (65 FE) MG PO TABS
325.0000 mg | ORAL_TABLET | Freq: Every day | ORAL | Status: DC
  Filled 2021-06-10: qty 1

## 2021-06-10 MED ORDER — DOFETILIDE 250 MCG PO CAPS
250.0000 ug | ORAL_CAPSULE | Freq: Two times a day (BID) | ORAL | Status: DC
  Administered 2021-06-10: 250 ug via ORAL
  Filled 2021-06-10 (×2): qty 1

## 2021-06-10 MED ORDER — DIPHENHYDRAMINE HCL 50 MG/ML IJ SOLN
INTRAMUSCULAR | Status: AC
  Filled 2021-06-10: qty 1

## 2021-06-10 MED ORDER — OLOPATADINE HCL 0.1 % OP SOLN
1.0000 [drp] | Freq: Two times a day (BID) | OPHTHALMIC | Status: DC
  Administered 2021-06-11: 1 [drp] via OPHTHALMIC
  Filled 2021-06-10: qty 5

## 2021-06-10 MED ORDER — HYOSCYAMINE SULFATE 0.125 MG SL SUBL
0.1250 mg | SUBLINGUAL_TABLET | SUBLINGUAL | Status: DC | PRN
  Filled 2021-06-10: qty 1

## 2021-06-10 MED ORDER — SODIUM CHLORIDE 0.9 % IV SOLN: 250.0000 mL | INTRAVENOUS | Status: DC | PRN

## 2021-06-10 MED ORDER — POTASSIUM CHLORIDE CRYS ER 20 MEQ PO TBCR
20.0000 meq | EXTENDED_RELEASE_TABLET | Freq: Every day | ORAL | Status: DC
  Administered 2021-06-10: 20 meq via ORAL
  Filled 2021-06-10: qty 1

## 2021-06-10 MED ORDER — ENSURE ENLIVE PO LIQD
237.0000 mL | Freq: Two times a day (BID) | ORAL | Status: DC
  Administered 2021-06-11: 237 mL via ORAL

## 2021-06-10 MED ORDER — VITAMIN B-12 1000 MCG PO TABS
1000.0000 ug | ORAL_TABLET | Freq: Every day | ORAL | Status: DC
  Administered 2021-06-11: 1000 ug via ORAL
  Filled 2021-06-10 (×2): qty 1

## 2021-06-10 MED ORDER — METOPROLOL TARTRATE 5 MG/5ML IV SOLN: 2.5000 mg | Freq: Four times a day (QID) | INTRAVENOUS | Status: DC | PRN

## 2021-06-10 MED ORDER — SODIUM CHLORIDE 0.9% FLUSH
3.0000 mL | Freq: Two times a day (BID) | INTRAVENOUS | Status: DC
  Administered 2021-06-10 – 2021-06-11 (×4): 3 mL via INTRAVENOUS

## 2021-06-10 MED ORDER — ONDANSETRON HCL 4 MG/2ML IJ SOLN: 4.0000 mg | Freq: Four times a day (QID) | INTRAMUSCULAR | Status: DC | PRN

## 2021-06-10 MED ORDER — OLOPATADINE HCL 0.1 % OP SOLN: 1.0000 [drp] | Freq: Two times a day (BID) | OPHTHALMIC | Status: DC

## 2021-06-10 MED ORDER — MG-PLUS PROTEIN 133 MG PO TABS: 2.0000 | ORAL_TABLET | Freq: Every day | ORAL | Status: DC

## 2021-06-10 MED ORDER — TRAMADOL HCL 50 MG PO TABS
50.0000 mg | ORAL_TABLET | Freq: Once | ORAL | Status: AC
Start: 2021-06-10 — End: 2021-06-10
  Administered 2021-06-10: 50 mg via ORAL
  Filled 2021-06-10: qty 1

## 2021-06-10 MED ORDER — APIXABAN 5 MG PO TABS
5.0000 mg | ORAL_TABLET | Freq: Two times a day (BID) | ORAL | Status: DC
  Administered 2021-06-10 – 2021-06-12 (×4): 5 mg via ORAL
  Filled 2021-06-10 (×4): qty 1

## 2021-06-10 MED ORDER — DILTIAZEM HCL 30 MG PO TABS
60.0000 mg | ORAL_TABLET | Freq: Three times a day (TID) | ORAL | Status: DC
  Administered 2021-06-10 – 2021-06-12 (×6): 60 mg via ORAL
  Filled 2021-06-10: qty 1
  Filled 2021-06-10 (×5): qty 2

## 2021-06-10 MED ORDER — IPRATROPIUM BROMIDE HFA 17 MCG/ACT IN AERS
2.0000 | INHALATION_SPRAY | Freq: Four times a day (QID) | RESPIRATORY_TRACT | Status: DC
  Filled 2021-06-10: qty 12.9

## 2021-06-10 MED ORDER — IPRATROPIUM BROMIDE 0.02 % IN SOLN
0.5000 mg | Freq: Four times a day (QID) | RESPIRATORY_TRACT | Status: DC
  Administered 2021-06-10 (×2): 0.5 mg via RESPIRATORY_TRACT
  Filled 2021-06-10 (×3): qty 2.5

## 2021-06-10 NOTE — H&P (Signed)
History and Physical    Chelsey Williams FFM:[id number] DOB: March 24, 1928 DOA: 06/10/2021  PCP: Briant Sites, DO (Confirm with patient/family/NH records and if not entered, this has to be entered at [facility] point of entry) Patient coming from: Home  I have personally briefly reviewed patient's old medical records in [place]  Chief Complaint: SOB, leg swelling  HPI: Chelsey Williams is a 88 y.o. female with medical history significant of PAF on Eliquis, PPM, nonobstructive CAD, Crohn's disease, COPD, HTN, HLD, came with increasing shortness of breath and leg swelling.  Symptoms started 5-6 days ago after patient stopped taking her Entocort.  Two months ago, patient had a flareup of Crohn's disease, when she had bowel obstruction.  After SBO resolved, patient was discharged home with Entocort 9 mg daily.  Initially patient tolerated well however about 10 to 14 days ago, patient started to have " lip swollen, epigastric pain, then she started to reduce the dose to 3 mg, however her symptoms persisted and then she stopped taking Entocort altogether 5 days ago.  And since then she started to have frequent palpitations and night sweats and increasingly she developed shortness of breath and leg swelling.  She also complains about frequent pressure-like chest pains associated with shortness of breath and started to have active cough with clear phlegm for last 2 days, but no fever or chills.  ED Course: Blood pressure elevated, borderline tachycardia, no hypoxia. Chest x-ray showed mild pulmonary edema.  Telemetry monitor showing patient rhythm between A-fib and paced.  Creatinine 1.1 glucose 178  1 dose of 80 mg IV Lasix given in the ED.  Review of Systems: As per HPI otherwise 14 point review of systems negative.    Past Medical History:  Diagnosis Date   Anxiety    Atypical chest pain 11/07/2016   Bilateral hearing loss    Coronary artery disease, non-occlusive    a. LHC 11/2014: R dom, LM  nl, mLAD 30-40, dLAD minor irregs, LCx minor irregs, RCA minor irregs w/ associated ectasia   Depression    GERD (gastroesophageal reflux disease)    Glaucoma    Hyperlipemia    Hypertension    Macular degeneration    OA (osteoarthritis)    Pacemaker    a. Medtronic Adapta ADDRL1 dual-chamber pacemaker (serial number [id number]) with Medtronic 5701 atrial lead (serial number [id number]) and Medtronic 5076 ventricular lead (serial number [id number]), all implanted 12/08/2014 by Dr. Jacklynn Barnacle at [facility] [phone].     Palpitations 08/16/2016   Persistent atrial fibrillation ([place])    a. s/p DCCV 02/2016; b. sotalol and Eliquis; c. CHADS2VASc --> 4 (HTN, age x 2, female)   Sinus node dysfunction ([place])     Past Surgical History:  Procedure Laterality Date   CHOLECYSTECTOMY     knee replacmen     PACEMAKER IMPLANT  2014   thyroid nodule       reports that she quit smoking about 21 years ago. She has never used smokeless tobacco. She reports that she does not drink alcohol and does not use drugs.  Allergies  Allergen Reactions   Cephalexin Anaphylaxis    Shock / Unconsciousness   Hydrochlorothiazide Other (See Comments)    dizziness dizziness   Morphine And Related Shortness Of Breath   Povidone-Iodine Itching   Prednisone Shortness Of Breath    SOB   Felodipine Nausea Only    Gastrointestinal problems, e.g., nausea, vomiting, diarrheaShock / Unconsciousnessmuscle tenderness   Magnesium-Containing Compounds  Diarrhea   Other Other (See Comments)    A blood pressure med STEROIDS   Quinolones     Aortic aneurysm    Statins Other (See Comments)   Amlodipine Other (See Comments) and Palpitations   Atenolol Palpitations    heart palpitations    Family History  Problem Relation Age of Onset   Hypertension Mother    Stroke Mother    Hypertension Father      Prior to Admission medications   Medication Sig Start Date End Date Taking? Authorizing Provider   apixaban (ELIQUIS) 5 MG TABS tablet Take 1 tablet (5 mg total) by mouth 2 (two) times daily. 07/25/17  Yes Deboraha Sprang, MD  calcium carbonate (TUMS - DOSED IN MG ELEMENTAL CALCIUM) 500 MG chewable tablet Chew 1 tablet by mouth every other day.   Yes [provider]  Cholecalciferol 50 MCG (2000 UT) TABS Take 2,000 Units by mouth daily at 12 noon.   Yes [provider]  cyanocobalamin (,VITAMIN B-12,) 1000 MCG/ML injection Inject 1,000 mcg into the muscle every 30 (thirty) days. 05/13/21 07/13/21 Yes [provider]  diltiazem (TIAZAC) 180 MG 24 hr capsule Take 180 mg by mouth 2 (two) times daily. 05/03/21  Yes [provider]  dofetilide (TIKOSYN) 250 MCG capsule Take 250 mcg by mouth 2 (two) times daily. 02/16/21  Yes [provider]  ipratropium (ATROVENT HFA) 17 MCG/ACT inhaler Inhale 2 puffs into the lungs 4 (four) times daily. 02/11/18 06/10/21 Yes [provider]  Lecithin 1200 MG CAPS Take 1,200 mg by mouth daily at 12 noon.   Yes [provider]  potassium chloride SA (KLOR-CON M) 20 MEQ tablet Take 20 mEq by mouth daily. 12/24/20  Yes [provider]  psyllium (METAMUCIL) 58.6 % packet Take 1 packet by mouth daily as needed.   Yes [provider]  senna-docusate (SENOKOT-S) 8.6-50 MG tablet Take 2 tablets by mouth 2 (two) times daily as needed for constipation. 05/13/21 06/12/21 Yes [provider]  Specialty Vitamins Products (MAGNESIUM, AMINO ACID CHELATE,) 133 MG tablet Take 2 tablets by mouth daily. 05/11/20  Yes [provider]  vitamin B-12 1000 MCG tablet Take 1 tablet (1,000 mcg total) by mouth daily. 04/05/21  Yes Wouk, Ailene Rud, MD  acetaminophen (TYLENOL) 325 MG tablet Take 325 mg by mouth every 4 (four) hours as needed.    [provider]  Aflibercept (EYLEA) 2 MG/0.05ML SOLN Apply to eye. 02/21/13   [provider]  albuterol (PROVENTIL HFA;VENTOLIN HFA) 108 (90  Base) MCG/ACT inhaler Inhale 2 puffs into the lungs every 6 (six) hours as needed for wheezing or shortness of breath. 10/17/17 06/13/19  Glean Hess, MD  budesonide (ENTOCORT EC) 3 MG 24 hr capsule Take 3 capsules (9 mg total) by mouth daily. Patient not taking: Reported on 06/10/2021 04/05/21   Gwynne Edinger, MD  Calcium Carbonate-Vitamin D (CALCIUM-VITAMIN D3 PO) Take 1 each by mouth daily. Patient not taking: Reported on 06/10/2021    [provider]  cholestyramine (QUESTRAN) 4 g packet Take by mouth 2 (two) times daily.  11/13/16   [provider]  ferrous sulfate 325 (65 FE) MG tablet Take 1 tablet by mouth daily with breakfast. Patient not taking: Reported on 06/10/2021 11/11/20 11/11/21  [provider]  Hyoscyamine Sulfate SL 0.125 MG SUBL Place 0.125 mg under the tongue every 4 (four) hours as needed. Patient not taking: Reported on 06/10/2021 11/11/20   [provider]  loperamide (IMODIUM) 2 MG capsule Take 2 mg by mouth as needed for diarrhea or loose stools.    [provider]  olopatadine (PATANOL) 0.1 % ophthalmic solution Place 1 drop into the right eye 2 (two) times daily.  11/07/16   [provider]  omeprazole (PRILOSEC) 20 MG capsule Take 20 mg by mouth daily as needed.     [provider]    Physical Exam: Vitals:   06/10/21 1545 06/10/21 1600 06/10/21 1615 06/10/21 1639  BP: (!) 149/97 (!) 154/97 (!) 148/95 (!) 148/82  Pulse: 77 93 (!) 104 (!) 106  Resp: 20 (!) 23 (!) 24 17  Temp:    98.6 F (37 C)  TempSrc:    Oral  SpO2: 99% 96% 94% 95%  Weight:      Height:        Constitutional: NAD, calm, comfortable Vitals:   06/10/21 1545 06/10/21 1600 06/10/21 1615 06/10/21 1639  BP: (!) 149/97 (!) 154/97 (!) 148/95 (!) 148/82  Pulse: 77 93 (!) 104 (!) 106  Resp: 20 (!) 23 (!) 24 17  Temp:    98.6 F (37 C)  TempSrc:    Oral  SpO2: 99% 96% 94% 95%  Weight:      Height:       Eyes: PERRL, lids and  conjunctivae normal ENMT: Mucous membranes are moist. Posterior pharynx clear of any exudate or lesions.Normal dentition.  Neck: normal, supple, no masses, no thyromegaly Respiratory: clear to auscultation bilaterally, no wheezing, fine crackles on bilateral lower bases, increased breathing effort.  No accessory muscle use.  Cardiovascular: Irregular rhythm, no murmurs / rubs / gallops.  2+ extremity edema. 2+ pedal pulses. No carotid bruits.  Abdomen: no tenderness, no masses palpated. No hepatosplenomegaly. Bowel sounds positive.  Musculoskeletal: no clubbing / cyanosis. No joint deformity upper and lower extremities. Good ROM, no contractures. Normal muscle tone.  Skin: no rashes, lesions, ulcers. No induration Neurologic: CN 2-12 grossly intact. Sensation intact, DTR normal. Strength 5/5 in all 4.  Psychiatric: Normal judgment and insight. Alert and oriented x 3. Normal mood.   Labs on Admission: I have personally reviewed following labs and imaging studies  CBC: Recent Labs  Lab 06/10/21 1243  WBC 6.5  HGB 11.6*  HCT 36.5  MCV 90.8  PLT 267   Basic Metabolic Panel: Recent Labs  Lab 06/10/21 1243  NA 135  K 3.5  CL 102  CO2 28  GLUCOSE 132*  BUN 17  CREATININE 0.72  CALCIUM 8.6*   GFR: Estimated Creatinine Clearance: 43.6 mL/min (by C-G formula based on SCr of 0.72 mg/dL). Liver Function Tests: No results for input(s): AST, ALT, ALKPHOS, BILITOT, PROT, ALBUMIN in the last 168 hours. No results for input(s): LIPASE, AMYLASE in the last 168 hours. No results for input(s): AMMONIA in the last 168 hours. Coagulation Profile: No results for input(s): INR, PROTIME in the last 168 hours. Cardiac Enzymes: No results for input(s): CKTOTAL, CKMB, CKMBINDEX, TROPONINI in the last 168 hours. BNP (last 3 results) No results for input(s): PROBNP in the last 8760 hours. HbA1C: No results for input(s): HGBA1C in the last 72 hours. CBG: No results for input(s): GLUCAP in the  last 168 hours. Lipid Profile: No results for input(s): CHOL, HDL, LDLCALC, TRIG, CHOLHDL, LDLDIRECT in the last 72 hours. Thyroid Function Tests: Recent Labs    06/10/21 1544  TSH 2.373   Anemia Panel: No results for input(s): VITAMINB12, FOLATE, FERRITIN, TIBC, IRON, RETICCTPCT in the last  72 hours. Urine analysis:    Component Value Date/Time   COLORURINE YELLOW (A) 06/12/2018 1233   APPEARANCEUR CLEAR (A) 06/12/2018 1233   APPEARANCEUR Clear 04/03/2012 1431   LABSPEC >1.046 (H) 06/12/2018 1233   LABSPEC 1.005 04/03/2012 1431   PHURINE 5.0 06/12/2018 1233   GLUCOSEU NEGATIVE 06/12/2018 1233   GLUCOSEU Negative 04/03/2012 1431   HGBUR SMALL (A) 06/12/2018 1233   BILIRUBINUR NEGATIVE 06/12/2018 1233   BILIRUBINUR Negative 04/03/2012 1431   KETONESUR 20 (A) 06/12/2018 1233   PROTEINUR NEGATIVE 06/12/2018 1233   NITRITE NEGATIVE 06/12/2018 1233   LEUKOCYTESUR NEGATIVE 06/12/2018 1233   LEUKOCYTESUR Negative 04/03/2012 1431    Radiological Exams on Admission: DG Chest 2 View  Result Date: 06/10/2021 CLINICAL DATA:  Chest pain. Patient reports stopping steroid budesonide due to reaction. Swollen lips. Stopped medicine 10 days ago. EXAM: CHEST - 2 VIEW COMPARISON:  06/15/2018 FINDINGS:: FINDINGS: Left chest wall cardiac pacer leads again overlie the right atrium and right ventricle. Moderate calcification within the aortic arch. Surgical clips overlie the superior, slightly right hemithorax. Cardiac silhouette is again mildly enlarged. Mediastinal contours are within normal limits with moderate calcification within the aortic arch. Mildly increased interstitial thickening. No definite pleural effusion. No pneumothorax. Moderate multilevel degenerative disc changes of the midthoracic spine. IMPRESSION: Increased mild bilateral interstitial thickening diffusely. This is compatible with mild interstitial pulmonary edema. Electronically Signed   By: Yvonne Kendall M.D.   On: 06/10/2021  12:46    EKG: Independently reviewed.  Paced rhythm  Assessment/Plan Principal Problem:   CHF (congestive heart failure) (HCC) Active Problems:   Acute on chronic diastolic CHF (congestive heart failure) ([place])  (please populate well all problems here in Problem List. (For example, if patient is on BP meds at home and you resume or decide to hold them, it is a problem that needs to be her. Same for CAD, COPD, HLD and so on)  Acute on chronic diastolic CHF compensation -Suspect underlying uncontrolled A-fib which happened after patient abruptly stopped steroid for her Crohn's disease. -Telemetry monitoring, as needed metoprolol for breakthrough A-fib -New IV Lasix 40 mg daily -TSH normal -Cussed with case with on-call cardiology Dr. Jenkins Rouge  Elevated troponins -Had occasional chest pain at home suspected to uncontrolled A-fib -Pattern is flat, likely demand ischemia from CHF compensation, will discuss with cardiology regarding further work-up.  PAF -Continue Tikosyn, and Cardizem, add as needed metoprolol for rate control -Continue Eliquis.  Crohn's disease -No abdominal pain, no constipation or diarrhea.  Encouraged her to discuss about maintenance therapy with GI as outpatient.  COPD -No symptoms or signs of acute exacerbation.  Chronic iron deficiency and B12 deficiency anemia -Continue iron supplement and B12 supplement.  DVT prophylaxis: Eliquis Code Status: Full code Family Communication: Son at bedside Disposition Plan: Patient is sick with probably underlying uncontrolled A-fib and CHF decompensation, expect more than 2 midnight hospital stay for IV diuresis. Consults called: Cardiology Admission status: Telemetry admission   Lequita Halt MD [facility] Pager [phone]  06/10/2021, 7:08 PM

## 2021-06-10 NOTE — ED Notes (Signed)
EDP at BS 

## 2021-06-10 NOTE — ED Provider Notes (Signed)
[facility] Provider Note    Event Date/Time   First MD Initiated Contact with Patient 06/10/21 1349     (approximate)   History   Chest Pain   HPI  Chelsey Williams is a 88 y.o. female with a history of Crohn's disease, macular degeneration, GERD, atrial fibrillation, coronary artery disease, status post pacemaker insertion for sinus node dysfunction who presents with multiple complaints, but mainly with increased shortness of breath, generalized weakness, and bilateral leg swelling over the last week.  She states that she previously was on budesonide for Crohn's but states she is allergic to steroids so was on a low dose.  She stopped the medication about 10 days ago and is concerned she is withdrawing from it.    Physical Exam   Triage Vital Signs: ED Triage Vitals [06/10/21 1208]  Enc Vitals Group     BP (!) 144/69     Pulse Rate 75     Resp 16     Temp 98.2 F (36.8 C)     Temp Source Oral     SpO2 97 %     Weight 158 lb 15.2 oz (72.1 kg)     Height 5' 3"  (1.6 m)     Head Circumference      Peak Flow      Pain Score 0     Pain Loc      Pain Edu?      Excl. in [place]?     Most recent vital signs: Vitals:   06/10/21 1500 06/10/21 1515  BP: 135/83 (!) 153/79  Pulse: 97 85  Resp: (!) 28 (!) 26  Temp:    SpO2: 96% 96%     General: Awake, no distress.  CV:  Good peripheral perfusion.  Resp:  Increased effort. Diminished breath sounds bilaterally.  Abd:  No distention.  Other:  2+ bilateral lower extremity edema.    ED Results / Procedures / Treatments   Labs (all labs ordered are listed, but only abnormal results are displayed) Labs Reviewed  BASIC METABOLIC PANEL - Abnormal; Notable for the following components:      Result Value   Glucose, Bld 132 (*)    Calcium 8.6 (*)    All other components within normal limits  CBC - Abnormal; Notable for the following components:   Hemoglobin 11.6 (*)    All other components within normal  limits  BRAIN NATRIURETIC PEPTIDE - Abnormal; Notable for the following components:   B Natriuretic Peptide 107.7 (*)    All other components within normal limits  TROPONIN I (HIGH SENSITIVITY) - Abnormal; Notable for the following components:   Troponin I (High Sensitivity) 37 (*)    All other components within normal limits  TROPONIN I (HIGH SENSITIVITY) - Abnormal; Notable for the following components:   Troponin I (High Sensitivity) 38 (*)    All other components within normal limits  RESP PANEL BY RT-PCR (FLU A&B, COVID) ARPGX2  TSH     EKG  ED ECG REPORT I, Arta Silence, the attending physician, personally viewed and interpreted this ECG.  Date: 06/10/2021 EKG Time: 1210 Rate: 73 Rhythm: Paced rhythm Intervals: normal ST/T Wave abnormalities: normal Narrative Interpretation: no evidence of acute ischemia    RADIOLOGY  I independently viewed and interpreted the chest x-ray, which shows bilateral interstitial edema   PROCEDURES:  Critical Care performed: No  Procedures   MEDICATIONS ORDERED IN ED: Medications  acetaminophen (TYLENOL) tablet 325 mg (has no  administration in time range)  diltiazem (CARDIZEM) tablet 60 mg (has no administration in time range)  dofetilide (TIKOSYN) capsule 250 mcg (has no administration in time range)  Hyoscyamine Sulfate SL SUBL 0.125 mg (has no administration in time range)  loperamide (IMODIUM) capsule 2 mg (has no administration in time range)  pantoprazole (PROTONIX) EC tablet 40 mg (has no administration in time range)  psyllium (METAMUCIL) 58.6 % packet 1 packet (has no administration in time range)  apixaban (ELIQUIS) tablet 5 mg (has no administration in time range)  ferrous sulfate tablet 325 mg (has no administration in time range)  vitamin B-12 (CYANOCOBALAMIN) tablet 1,000 mcg (has no administration in time range)  potassium chloride SA (KLOR-CON M) CR tablet 20 mEq (has no administration in time range)   magnesium (amino acid chelate) tablet 2 tablet (has no administration in time range)  ipratropium (ATROVENT HFA) inhaler 2 puff (has no administration in time range)  olopatadine (PATANOL) 0.1 % ophthalmic solution 1 drop (has no administration in time range)  sodium chloride flush (NS) 0.9 % injection 3 mL (has no administration in time range)  sodium chloride flush (NS) 0.9 % injection 3 mL (has no administration in time range)  0.9 %  sodium chloride infusion (has no administration in time range)  acetaminophen (TYLENOL) tablet 650 mg (has no administration in time range)  ondansetron (ZOFRAN) injection 4 mg (has no administration in time range)  metoprolol tartrate (LOPRESSOR) injection 2.5 mg (has no administration in time range)  furosemide (LASIX) injection 80 mg (80 mg Intravenous Given 06/10/21 1448)  traMADol (ULTRAM) tablet 50 mg (50 mg Oral Given 06/10/21 1527)     IMPRESSION / MDM / ASSESSMENT AND PLAN / ED COURSE  I reviewed the triage vital signs and the nursing notes.  88 year old female with PMH of  Crohn's disease, macular degeneration, GERD, atrial fibrillation, coronary artery disease, status post pacemaker insertion for sinus node dysfunction presents with increased shortness of breath, generalized weakness, and lower extremity edema over approximately the last week.  She was recently treated with steroids for Crohn's disease but stopped about 10 days ago.  On exam the vital signs are normal except for mild hypertension and some tachypnea.  O2 saturation is in the mid to high 90s on room air.  However the patient does have increased work of breathing and diminished breath sounds bilaterally.  She has relatively significant peripheral edema.  Differential diagnosis includes, but is not limited to, new onset CHF, decompensated atrial fibrillation, ACS, pneumonia, acute bronchitis, COVID-19 or other viral etiology.  Initial lab work-up reveals mildly elevated troponin.  Chest  x-ray shows mild interstitial edema.  I have ordered IV Lasix and we will obtain additional labs.  I anticipate likely admission.  The patient is on the cardiac monitor to evaluate for evidence of arrhythmia and/or significant heart rate changes.  ----------------------------------------- 3:18 PM on 06/10/2021 -----------------------------------------  Repeat troponin has not risen significantly.  BNP is minimally elevated.  Respiratory panel is negative.  I consulted the hospitalist Dr. Roosevelt Locks; based on our discussion he agrees to admit the patient.  FINAL CLINICAL IMPRESSION(S) / ED DIAGNOSES   Final diagnoses:  Peripheral edema  Shortness of breath     Rx / DC Orders   ED Discharge Orders     None        Note:  This document was prepared using Dragon voice recognition software and may include unintentional dictation errors.    Arta Silence, MD 06/10/21 [id number]

## 2021-06-10 NOTE — ED Notes (Signed)
States, "Can breathe better, feel better"

## 2021-06-10 NOTE — ED Notes (Signed)
Pt alert, NAD, calm, interactive, resps e/u, speaking in shortened phrases, HOH, son at [facility], EDP at [facility].

## 2021-06-10 NOTE — ED Triage Notes (Addendum)
Patient states she took herself off of steroids budesonide, due to having a reaction to meds.  STates lips had been swollen.  States stopped meds 10 days ago, c/o stomach burning, chest pain, fatigue.  Patient has multiple complaints.  AAOx3.  Skin warm and dry. NAD

## 2021-06-10 NOTE — ED Notes (Signed)
RN aware of bed assigned 

## 2021-06-10 NOTE — ED Notes (Signed)
Admitting MD, and son at [facility]

## 2021-06-11 ENCOUNTER — Inpatient Hospital Stay
Admit: 2021-06-11 | Discharge: 2021-06-11 | Disposition: A | Discharge status: Home / Self Care | Payer: Medicare Other | Attending: Internal Medicine | Admitting: Internal Medicine

## 2021-06-11 ENCOUNTER — Inpatient Hospital Stay: Payer: Medicare Other

## 2021-06-11 DIAGNOSIS — J449 Chronic obstructive pulmonary disease, unspecified: Secondary | ICD-10-CM

## 2021-06-11 DIAGNOSIS — K50012 Crohn's disease of small intestine with intestinal obstruction: Secondary | ICD-10-CM

## 2021-06-11 DIAGNOSIS — I480 Paroxysmal atrial fibrillation: Secondary | ICD-10-CM

## 2021-06-11 LAB — BASIC METABOLIC PANEL
Anion gap: 7 (ref 5–15)
BUN: 14 mg/dL (ref 8–23)
CO2: 30 mmol/L (ref 22–32)
Calcium: 8.4 mg/dL — ABNORMAL LOW (ref 8.9–10.3)
Chloride: 102 mmol/L (ref 98–111)
Creatinine, Ser: 0.69 mg/dL (ref 0.44–1.00)
GFR, Estimated: >60 mL/min (ref >60)
Glucose, Bld: 98 mg/dL (ref 70–99)
Potassium: 3.4 mmol/L — ABNORMAL LOW (ref 3.5–5.1)
Sodium: 139 mmol/L (ref 135–145)

## 2021-06-11 LAB — MAGNESIUM: Magnesium: 1.8 mg/dL (ref 1.7–2.4)

## 2021-06-11 MED ORDER — MAGNESIUM CHLORIDE 64 MG PO TBEC
1.0000 | DELAYED_RELEASE_TABLET | Freq: Every day | ORAL | Status: DC
  Filled 2021-06-11 (×2): qty 1

## 2021-06-11 MED ORDER — DIPHENHYDRAMINE HCL 50 MG/ML IJ SOLN
INTRAMUSCULAR | Status: AC
  Filled 2021-06-11: qty 1

## 2021-06-11 MED ORDER — METOPROLOL TARTRATE 50 MG PO TABS
50.0000 mg | ORAL_TABLET | Freq: Two times a day (BID) | ORAL | Status: DC
Start: 2021-06-11 — End: 2021-06-12
  Administered 2021-06-11 – 2021-06-12 (×3): 50 mg via ORAL
  Filled 2021-06-11 (×3): qty 1

## 2021-06-11 MED ORDER — TRAMADOL HCL 50 MG PO TABS
50.0000 mg | ORAL_TABLET | Freq: Two times a day (BID) | ORAL | Status: DC | PRN
  Administered 2021-06-11 – 2021-06-12 (×2): 50 mg via ORAL
  Filled 2021-06-11 (×2): qty 1

## 2021-06-11 MED ORDER — MAGNESIUM SULFATE 2 GM/50ML IV SOLN: 2.0000 g | Freq: Once | INTRAVENOUS | Status: DC

## 2021-06-11 MED ORDER — MAGNESIUM SULFATE 2 GM/50ML IV SOLN
2.0000 g | Freq: Once | INTRAVENOUS | Status: DC
  Filled 2021-06-11: qty 50

## 2021-06-11 MED ORDER — POTASSIUM CHLORIDE CRYS ER 20 MEQ PO TBCR
40.0000 meq | EXTENDED_RELEASE_TABLET | Freq: Every day | ORAL | Status: DC
  Administered 2021-06-11: 40 meq via ORAL
  Filled 2021-06-11 (×2): qty 2

## 2021-06-11 MED ORDER — POTASSIUM CHLORIDE CRYS ER 20 MEQ PO TBCR
40.0000 meq | EXTENDED_RELEASE_TABLET | Freq: Once | ORAL | Status: AC
  Administered 2021-06-11: 40 meq via ORAL
  Filled 2021-06-11 (×2): qty 2

## 2021-06-11 NOTE — Consult Note (Signed)
[facility] Consultation Note  Patient ID: Chelsey Williams, MRN: [id number], DOB/AGE: 1927-12-28 88 y.o. Admit date: 06/10/2021   Date of Consult: 06/11/2021 Primary Physician: Briant Sites, DO Primary Cardiologist: Omelia Blackwater  Chief Complaint:  Chief Complaint  Patient presents with   Chest Pain   Reason for Consult:  Atrial fibrillation  HPI: 88 y.o. female with known paroxysmal nonvalvular atrial fibrillation status post previous medication management including sotalol anticus and for which the patient also has had sick sinus syndrome status post pacemaker placement.  The patient recently has had issues with infection and has used steroids for which she had significant irregular heartbeats and weakness and fatigue and other tingling.  Due to this issue the patient was seen in the emergency room at which time she has an EKG showing atrial fibrillation with ventricular pacing.  Troponin was 38/37.  Chest x-ray showed pulmonary edema.  Patient subsequently has had continued episodes of irregularity of her heartbeat and shortness of breath with lower extremity edema and congestive heart failure.  Echocardiogram in December 2022 showed normal LV systolic function with ejection fraction of 55% and no evidence of significant valvular heart disease.  This morning she has had taking medication including Tikosyn for possible heart rate control and maintenance of normal rhythm and says she has had multiple episodes where she has had side effects of this medication including tingling in her mouth and throat.  Currently the patient does not have any swelling of her mouth and/or throat and is breathing easily at this time without evidence of respiratory distress.  She does have coronary artery disease and hypertension which have been controlled with current.  Currently she is hemodynamically stable  Past Medical History:  Diagnosis Date   Anxiety    Atypical chest pain 11/07/2016   Bilateral  hearing loss    Coronary artery disease, non-occlusive    a. LHC 11/2014: R dom, LM nl, mLAD 30-40, dLAD minor irregs, LCx minor irregs, RCA minor irregs w/ associated ectasia   Depression    GERD (gastroesophageal reflux disease)    Glaucoma    Hyperlipemia    Hypertension    Macular degeneration    OA (osteoarthritis)    Pacemaker    a. Medtronic Adapta ADDRL1 dual-chamber pacemaker (serial number [id number]) with Medtronic 7517 atrial lead (serial number [id number]) and Medtronic 5076 ventricular lead (serial number [id number]), all implanted 12/08/2014 by Dr. Jacklynn Barnacle at [facility] [phone].     Palpitations 08/16/2016   Persistent atrial fibrillation ([place])    a. s/p DCCV 02/2016; b. sotalol and Eliquis; c. CHADS2VASc --> 4 (HTN, age x 2, female)   Sinus node dysfunction ([place])       Surgical History:  Past Surgical History:  Procedure Laterality Date   CHOLECYSTECTOMY     knee replacmen     PACEMAKER IMPLANT  2014   thyroid nodule       Home Meds: Prior to Admission medications   Medication Sig Start Date End Date Taking? Authorizing Provider  apixaban (ELIQUIS) 5 MG TABS tablet Take 1 tablet (5 mg total) by mouth 2 (two) times daily. 07/25/17  Yes Deboraha Sprang, MD  calcium carbonate (TUMS - DOSED IN MG ELEMENTAL CALCIUM) 500 MG chewable tablet Chew 1 tablet by mouth every other day.   Yes [provider]  Cholecalciferol 50 MCG (2000 UT) TABS Take 2,000 Units by mouth daily at 12 noon.   Yes [provider]  cyanocobalamin (,VITAMIN B-12,) 1000 MCG/ML injection  Inject 1,000 mcg into the muscle every 30 (thirty) days. 05/13/21 07/13/21 Yes [provider]  diltiazem (TIAZAC) 180 MG 24 hr capsule Take 180 mg by mouth 2 (two) times daily. 05/03/21  Yes [provider]  dofetilide (TIKOSYN) 250 MCG capsule Take 250 mcg by mouth 2 (two) times daily. 02/16/21  Yes [provider]  ipratropium (ATROVENT HFA) 17 MCG/ACT inhaler Inhale 2  puffs into the lungs 4 (four) times daily. 02/11/18 06/10/21 Yes [provider]  Lecithin 1200 MG CAPS Take 1,200 mg by mouth daily at 12 noon.   Yes [provider]  potassium chloride SA (KLOR-CON M) 20 MEQ tablet Take 20 mEq by mouth daily. 12/24/20  Yes [provider]  psyllium (METAMUCIL) 58.6 % packet Take 1 packet by mouth daily as needed.   Yes [provider]  senna-docusate (SENOKOT-S) 8.6-50 MG tablet Take 2 tablets by mouth 2 (two) times daily as needed for constipation. 05/13/21 06/12/21 Yes [provider]  Specialty Vitamins Products (MAGNESIUM, AMINO ACID CHELATE,) 133 MG tablet Take 2 tablets by mouth daily. 05/11/20  Yes [provider]  vitamin B-12 1000 MCG tablet Take 1 tablet (1,000 mcg total) by mouth daily. 04/05/21  Yes Wouk, Ailene Rud, MD  acetaminophen (TYLENOL) 325 MG tablet Take 325 mg by mouth every 4 (four) hours as needed.    [provider]  Aflibercept (EYLEA) 2 MG/0.05ML SOLN Apply to eye. 02/21/13   [provider]  albuterol (PROVENTIL HFA;VENTOLIN HFA) 108 (90 Base) MCG/ACT inhaler Inhale 2 puffs into the lungs every 6 (six) hours as needed for wheezing or shortness of breath. 10/17/17 06/13/19  Glean Hess, MD  budesonide (ENTOCORT EC) 3 MG 24 hr capsule Take 3 capsules (9 mg total) by mouth daily. Patient not taking: Reported on 06/10/2021 04/05/21   Gwynne Edinger, MD  Calcium Carbonate-Vitamin D (CALCIUM-VITAMIN D3 PO) Take 1 each by mouth daily. Patient not taking: Reported on 06/10/2021    [provider]  cholestyramine (QUESTRAN) 4 g packet Take by mouth 2 (two) times daily.  11/13/16   [provider]  ferrous sulfate 325 (65 FE) MG tablet Take 1 tablet by mouth daily with breakfast. Patient not taking: Reported on 06/10/2021 11/11/20 11/11/21  [provider]  Hyoscyamine Sulfate SL 0.125 MG SUBL Place 0.125 mg under the tongue every 4 (four) hours as  needed. Patient not taking: Reported on 06/10/2021 11/11/20   [provider]  loperamide (IMODIUM) 2 MG capsule Take 2 mg by mouth as needed for diarrhea or loose stools.    [provider]  olopatadine (PATANOL) 0.1 % ophthalmic solution Place 1 drop into the right eye 2 (two) times daily.  11/07/16   [provider]  omeprazole (PRILOSEC) 20 MG capsule Take 20 mg by mouth daily as needed.     [provider]    Inpatient Medications:   apixaban  5 mg Oral BID   diltiazem  60 mg Oral Q8H   feeding supplement  237 mL Oral BID BM   ferrous sulfate  325 mg Oral Q breakfast   furosemide  40 mg Intravenous Daily   magnesium oxide  400 mg Oral Daily   metoprolol tartrate  50 mg Oral BID   olopatadine  1 drop Right Eye BID   pantoprazole  40 mg Oral Daily   potassium chloride SA  40 mEq Oral Daily   potassium chloride  40 mEq Oral Once   sodium  chloride flush  3 mL Intravenous Q12H   cyanocobalamin  1,000 mcg Oral Daily    sodium chloride      Allergies:  Allergies  Allergen Reactions   Cephalexin Anaphylaxis    Shock / Unconsciousness   Hydrochlorothiazide Other (See Comments)    dizziness dizziness   Morphine And Related Shortness Of Breath   Povidone-Iodine Itching   Prednisone Shortness Of Breath    SOB   Felodipine Nausea Only    Gastrointestinal problems, e.g., nausea, vomiting, diarrheaShock / Unconsciousnessmuscle tenderness   Magnesium-Containing Compounds     Diarrhea   Other Other (See Comments)    A blood pressure med STEROIDS   Quinolones     Aortic aneurysm    Statins Other (See Comments)   Amlodipine Other (See Comments) and Palpitations   Atenolol Palpitations    heart palpitations    Social History   Socioeconomic History   Marital status: Widowed    Spouse name: Not on file   Number of children: Not on file   Years of education: Not on file   Highest education level: Not on file  Occupational History   Not on  file  Tobacco Use   Smoking status: Former    Types: Cigarettes    Quit date: 11/15/1999    Years since quitting: 21.5   Smokeless tobacco: Never  Vaping Use   Vaping Use: Never used  Substance and Sexual Activity   Alcohol use: No   Drug use: No   Sexual activity: Not Currently  Other Topics Concern   Not on file  Social History Narrative   Not on file   Social Determinants of Health   Financial Resource Strain: Not on file  Food Insecurity: Not on file  Transportation Needs: Not on file  Physical Activity: Not on file  Stress: Not on file  Social Connections: Not on file  Intimate Partner Violence: Not on file     Family History  Problem Relation Age of Onset   Hypertension Mother    Stroke Mother    Hypertension Father      Review of Systems Positive for mouth tingling edema shortness of breath Negative for: General:  chills, fever, night sweats or weight changes.  Cardiovascular: PND orthopnea syncope dizziness  Dermatological skin lesions rashes Respiratory: Cough congestion Urologic: Frequent urination urination at night and hematuria Abdominal: negative for nausea, vomiting, diarrhea, bright red blood per rectum, melena, or hematemesis Neurologic: negative for visual changes, and/or hearing changes  All other systems reviewed and are otherwise negative except as noted above.  Labs: Recent Labs    06/10/21 1428  CKTOTAL 127   Lab Results  Component Value Date   WBC 6.5 06/10/2021   HGB 11.6 (L) 06/10/2021   HCT 36.5 06/10/2021   MCV 90.8 06/10/2021   PLT 301 06/10/2021    Recent Labs  Lab 06/11/21 0451  NA 139  K 3.4*  CL 102  CO2 30  BUN 14  CREATININE 0.69  CALCIUM 8.4*  GLUCOSE 98   No results found for: CHOL, HDL, LDLCALC, TRIG No results found for: DDIMER  Radiology/Studies:  DG Chest 2 View  Result Date: 06/10/2021 CLINICAL DATA:  Chest pain. Patient reports stopping steroid budesonide due to reaction. Swollen lips. Stopped  medicine 10 days ago. EXAM: CHEST - 2 VIEW COMPARISON:  06/15/2018 FINDINGS:: FINDINGS: Left chest wall cardiac pacer leads again overlie the right atrium and right ventricle. Moderate calcification within the aortic arch. Surgical clips overlie  the superior, slightly right hemithorax. Cardiac silhouette is again mildly enlarged. Mediastinal contours are within normal limits with moderate calcification within the aortic arch. Mildly increased interstitial thickening. No definite pleural effusion. No pneumothorax. Moderate multilevel degenerative disc changes of the midthoracic spine. IMPRESSION: Increased mild bilateral interstitial thickening diffusely. This is compatible with mild interstitial pulmonary edema. Electronically Signed   By: Yvonne Kendall M.D.   On: 06/10/2021 12:46    EKG: Atrial fibrillation with rapid ventricular rate and ventricular pacing  Weights: Filed Weights   06/10/21 1208 06/11/21 0500  Weight: 72.1 kg 72 kg     Physical Exam: Blood pressure 139/86, pulse (!) 102, temperature 98.2 F (36.8 C), temperature source Oral, resp. rate 19, height 5' 3"  (1.6 m), weight 72 kg, SpO2 94 %. Body mass index is 28.12 kg/m. General: Well developed, well nourished, in no acute distress. Head eyes ears nose throat: Normocephalic, atraumatic, sclera non-icteric, no xanthomas, nares are without discharge. No apparent thyromegaly and/or mass  Lungs: Normal respiratory effort.  no wheezes, basilar rales, no rhonchi.  Heart: Irregular with normal S1 S2. no murmur gallop, no rub, PMI is normal size and placement, carotid upstroke normal without bruit, jugular venous pressure is normal Abdomen: Soft, non-tender, non-distended with normoactive bowel sounds. No hepatomegaly. No rebound/guarding. No obvious abdominal masses. Abdominal aorta is normal size without bruit Extremities: 1-2+ edema. no cyanosis, no clubbing, no ulcers  Peripheral : 2+ bilateral upper extremity pulses, 2+ bilateral  femoral pulses, 2+ bilateral dorsal pedal pulse Neuro: Alert and oriented. No facial asymmetry. No focal deficit. Moves all extremities spontaneously. Musculoskeletal: Normal muscle tone without kyphosis Psych:  Responds to questions appropriately with a normal affect.    Assessment: 88 year old female with paroxysmal nonvalvular atrial fibrillation now with atrial fibrillation with rapid ventricular rate possibly exacerbating acute on chronic diastolic dysfunction congestive heart failure without evidence of acute coronary syndrome with demand ischemia from above having tingling of her mouth possibly secondary to side effects of medication  Plan: 1.  Discontinuation of Tikosyn due to concerns of side effects of that medication causing her tingling in her mouth without current evidence of respiratory distress 2.  Benadryl for treatment of potential side effects of the medication 3.  Continue anticoagulation for further risk reduction and stroke with atrial fibrillation 4.  Heart rate control of atrial fibrillation with metoprolol 50 mg twice per day as well as Cardizem as she is previously taken 5.  Diuresis of pulmonary edema and lower extremity edema from diastolic dysfunction congestive heart failure with intravenous Lasix 6.  Consideration of echocardiogram for further evaluation and treatment options of diastolic dysfunction  Signed, Corey Skains M.D. [facility] 06/11/2021, 7:57 AM

## 2021-06-11 NOTE — Consult Note (Signed)
Chelsey Williams for Electrolyte Monitoring and Replacement   Recent Labs: Potassium (mmol/L)  Date Value  06/11/2021 3.4 (L)   Magnesium (mg/dL)  Date Value  06/11/2021 1.8   Calcium (mg/dL)  Date Value  06/11/2021 8.4 (L)   Albumin (g/dL)  Date Value  03/31/2021 3.8   Sodium (mmol/L)  Date Value  06/11/2021 139  05/22/2017 142   Assessment: Patient is a 88 y/o F with medical history including PAF on Eliquis, PPM, non-obstructive CAD, Crohn's disease, COPD, HTN, HLD who is admitted with CHF exacerbation. Patient is on Tikosyn. Pharmacy consulted to assist with electrolyte monitoring and replacement as indicated.   Diuretics: IV Lasix 40 mg daily  Goal of Therapy:  K >= 4 Mg >= 2 All other electrolytes within normal limits  Plan:  --Increase daily potassium supplement to 40 mEq. Add additional dose of Kcl 40 mEq this evening --Magnesium sulfate 2 g IV x 1 --Follow-up electrolytes with AM labs tomorrow  Chelsey Williams 06/11/2021 7:59 AM

## 2021-06-11 NOTE — Progress Notes (Signed)
Patient took her routine night medications which are the same ones she takes at home. After administering them to her, she complained of "burning" in her mouth and a metallic taste which she said would not go away. She complained about her "throat hurting", her voice becoming hoarse and her tongue getting sore.  A STAT order for Benadryl was administered and she got some relief.  She also complained of being in pain and I administered Tylenol 650 mg. She wanted something stronger, but on assessment, she stated that the pain was subsiding.  Patient is currently sound asleep.

## 2021-06-11 NOTE — Assessment & Plan Note (Signed)
Continue Eliquis for anticoagulation.  Cardizem and metoprolol for rate control

## 2021-06-11 NOTE — TOC Initial Note (Signed)
Transition of Care [facility]) - Initial/Assessment Note    Patient Details  Name: Chelsey Williams MRN: [id number] Date of Birth: 29-Nov-1927  Transition of Care [facility]) CM/SW Contact:    Harriet Masson, RN Phone [phone] 06/11/2021, 4:20 PM  Clinical Narrative:                 Spoke with son Aaron Edelman) concerning [facility] recommendations for PT/OT/RN in the home. Son agreed to any agency. [facility]) agreed to service this pt for services however the son wishes to further discuss with pt for tomorrow and if there are any changes will notify TOC concerning any changes. RN also spoke with pt with this update however pt leaning toward discharging home with no [facility]Health services.  Pt lives in a house with her son Aaron Edelman) with a dog. Pt able to afford her medications and able to get to her medical appointments. Possible discharged on Monday or Tuesday. Pt is A&O however HOH and states she is unable to speak on the phone.  Pt already has a rolling wheel for ambulation.   TOC will continue to monitor pt for discharge needs.  Expected Discharge Plan: [place] Barriers to Discharge: Continued Medical Work up   Patient Goals and CMS Choice     Choice offered to / list presented to : Adult Children  Expected Discharge Plan and Services Expected Discharge Plan: [place]   Discharge Planning Services: CM Consult                               HH Arranged: RN, PT, OT [facility] Agency: [place] ([place]) Date HH Agency Contacted: 06/11/21 Time [place]: 24 Representative spoke with at [place]: [place] Arrangements/Services   Lives with:: Adult Children Patient language and need for interpreter reviewed:: Yes        Need for Family Participation in Patient Care: Yes (Comment) Care giver support system in place?: Yes (comment)   Criminal Activity/Legal Involvement Pertinent to Current  Situation/Hospitalization: No - Comment as needed  Activities of Daily Living Home Assistive Devices/Equipment: Walker (specify type) ADL Screening (condition at time of admission) Patient's cognitive ability adequate to safely complete daily activities?: Yes Is the patient deaf or have difficulty hearing?: Yes Does the patient have difficulty seeing, even when wearing glasses/contacts?: No Does the patient have difficulty concentrating, remembering, or making decisions?: No Patient able to express need for assistance with ADLs?: Yes Does the patient have difficulty dressing or bathing?: No Independently performs ADLs?: Yes (appropriate for developmental age) Does the patient have difficulty walking or climbing stairs?: Yes Weakness of Legs: None Weakness of Arms/Hands: None  Permission Sought/Granted   Permission granted to share information with : Yes, Verbal Permission Granted              Emotional Assessment Appearance:: Appears older than stated age Attitude/Demeanor/Rapport: Engaged Affect (typically observed): Accepting Orientation: : Oriented to Self, Oriented to Place, Oriented to  Time, Oriented to Situation   Psych Involvement: No (comment)  Admission diagnosis:  Shortness of breath [R06.02] CHF (congestive heart failure) (HCC) [I50.9] Peripheral edema [R60.9] Patient Active Problem List   Diagnosis Date Noted   Acute on chronic diastolic CHF (congestive heart failure) ([place]) 06/10/2021   CHF (congestive heart failure) ([place]) 06/10/2021   B12 deficiency 04/02/2021   Crohn's disease ([place]) 04/01/2021   Partial small bowel obstruction ([place])  03/31/2021   Hiatal hernia with gastroesophageal reflux    Acute abdominal pain 06/12/2018   COPD (chronic obstructive pulmonary disease) ([place]) 10/17/2017   Irritable bowel syndrome with diarrhea 10/17/2017   Scalp lesion 10/17/2017   Vision loss of left eye 08/21/2017   Labile hypertension 11/07/2016   Anxiety 11/07/2016    Coronary artery disease, non-occlusive 08/16/2016   Episodic atrial fibrillation ([place]) 08/16/2016   Pacemaker 08/16/2016   Essential hypertension 08/16/2016   Dilatation of thoracic aorta ([place]) 08/16/2016   HLD (hyperlipidemia) 08/16/2016   Macular degeneration 08/16/2016   Anticoagulant long-term use 03/24/2016   AV block 03/24/2016   Bilateral hearing loss 11/06/2014   History of thyroid nodule 11/06/2014   Nonexudative senile macular degeneration of retina 03/31/2013   Pseudophakia 03/31/2013   PVD (posterior vitreous detachment), both eyes 03/31/2013   Generalized osteoarthritis of multiple sites 02/05/2004   PCP:  Briant Sites, DO Pharmacy:   [facility], [address] [place] [address] Phone: [phone] Fax: [place], [address] [place] [address] Phone: [phone] Fax: [phone]     Social Determinants of Health (SDOH) Interventions    Readmission Risk Interventions No flowsheet data found.

## 2021-06-11 NOTE — Progress Notes (Signed)
°  Patient's vitals taken at end of shift.   06/11/21 0646  Vitals  Temp 98.2 F (36.8 C)  Temp Source Oral  BP 139/86  MAP (mmHg) 93  BP Location Right Arm  BP Method Automatic  Patient Position (if appropriate) Sitting  Pulse Rate (!) 102  Pulse Rate Source Monitor  ECG Heart Rate 95  Resp 19  MEWS COLOR  MEWS Score Color Green  Oxygen Therapy  SpO2 94 %  MEWS Score  MEWS Temp 0  MEWS Systolic 0  MEWS Pulse 0  MEWS RR 0  MEWS LOC 0  MEWS Score 0

## 2021-06-11 NOTE — Assessment & Plan Note (Signed)
She has an outpatient follow-up with Dr. Marius Ditch next Wednesday

## 2021-06-11 NOTE — Evaluation (Signed)
Physical Therapy Evaluation Patient Details Name: Chelsey Williams MRN: [id number] DOB: 03-18-1928 Today's Date: 06/11/2021  History of Present Illness  Patient is a 88 year old female who presented to ED with shortness of breath, generalized weakness, and bilateral LE swelling. States she was on budesonide for Crohn's but states she is allergic. PMH includes Crohn's Disease, macular degeneration, GERD, A fib, CAD, status post pacemaker insertion for sinus node dysfunction, and anxiety.   Clinical Impression  Patient is a pleasant 88 year old female who presents with generalized weakness and instability. Prior to hospital admission, pt was ambulatory with AD and lives with son who helps with the housework, shopping, and iADLs.  Patient is in bed upon PT arrival and agreeable to participate in therapy session. Patient needs to use restroom immediately and is transferred to [facility] with CGA/supervision with RW. Patient is able to self clean, stand and requires min A to pull underwear up. Patient ambulates with RW with CGA for 3 laps around the nursing station. Upon returning into room she performs seated strengthening interventions and sit to stands with close CGA. HR monitored with rest breaks applied to remain within therapeutic range. Patient's needs met with nursing notified.   Pt would benefit from skilled PT to address noted impairments and functional limitations (see below for any additional details).  Upon hospital discharge, pt would benefit from HHPT to improve strength and reduce risk of falls.        Recommendations for follow up therapy are one component of a multi-disciplinary discharge planning process, led by the attending physician.  Recommendations may be updated based on patient status, additional functional criteria and insurance authorization.  Follow Up Recommendations Home health PT    Assistance Recommended at Discharge Intermittent Supervision/Assistance  Patient can return home with  the following  A little help with walking and/or transfers;A little help with bathing/dressing/bathroom;Assistance with cooking/housework;Direct supervision/assist for medications management;Assist for transportation;Help with stairs or ramp for entrance    Equipment Recommendations None recommended by PT  Recommendations for Other Services       Functional Status Assessment Patient has had a recent decline in their functional status and demonstrates the ability to make significant improvements in function in a reasonable and predictable amount of time.     Precautions / Restrictions Precautions Precautions: Fall Precaution Comments: requires RW and has incontinance Restrictions Weight Bearing Restrictions: No Other Position/Activity Restrictions: has pain in L shoulder      Mobility  Bed Mobility Overal bed mobility: Needs Assistance Bed Mobility: Supine to Sit     Supine to sit: Min guard     General bed mobility comments: Patient transitions to EOB with supervision/guard.    Transfers Overall transfer level: Needs assistance Equipment used: Rolling walker (2 wheels) Transfers: Sit to/from Stand Sit to Stand: Supervision, Min guard           General transfer comment: CGA cues for sequencing and safety    Ambulation/Gait Ambulation/Gait assistance: Supervision, Min guard Gait Distance (Feet): 480 Feet Assistive device: Rolling walker (2 wheels) Gait Pattern/deviations: Step-through pattern, Trunk flexed, Narrow base of support Gait velocity: decreased     General Gait Details: Patient relies on RW for ambulation  Stairs            Wheelchair Mobility    Modified Rankin (Stroke Patients Only)       Balance Overall balance assessment: Needs assistance Sitting-balance support: No upper extremity supported, Feet supported Sitting balance-Leahy Scale: Fair  Standing balance support: Bilateral upper extremity supported, Reliant on assistive  device for balance, During functional activity Standing balance-Leahy Scale: Fair Standing balance comment: Requires use of RW for ambulation                             Pertinent Vitals/Pain Pain Assessment Pain Assessment: 0-10 Pain Score: 3  Pain Location: L shoulder Pain Descriptors / Indicators: Sore Pain Intervention(s): Monitored during session    Home Living Family/patient expects to be discharged to:: Private residence Living Arrangements: Children (lives with son) Available Help at Discharge: Family Type of Home: House Home Access: Stairs to enter   Technical brewer of Steps: 11   Home Layout: One level Home Equipment: Conservation officer, nature (2 wheels);Rollator (4 wheels);Shower seat;BSC/3in1      Prior Function Prior Level of Function : Needs assist             Mobility Comments: uses AD at baseline, walks with his son for shopping ADLs Comments: Can do laundry, son does the majority of the housework.     Hand Dominance   Dominant Hand: Right    Extremity/Trunk Assessment   Upper Extremity Assessment Upper Extremity Assessment: Defer to OT evaluation    Lower Extremity Assessment Lower Extremity Assessment: Generalized weakness (Grossly 4-/5 hips, 3+/5 ankles)       Communication   Communication: No difficulties  Cognition Arousal/Alertness: Awake/alert Behavior During Therapy: WFL for tasks assessed/performed Overall Cognitive Status: Within Functional Limits for tasks assessed                                 General Comments: A and O x 4; HOH        General Comments General comments (skin integrity, edema, etc.): Patient appears well nourished and well groomed    Exercises Other Exercises Other Exercises: Patient educated on role of PT in acute care setting, safe mobility and transfers, toileting, ambulation.   Assessment/Plan    PT Assessment Patient needs continued PT services  PT Problem List Decreased  strength;Decreased activity tolerance;Decreased balance;Decreased knowledge of use of DME;Decreased mobility;Pain       PT Treatment Interventions DME instruction;Gait training;Stair training;Functional mobility training;Therapeutic activities;Patient/family education;Neuromuscular re-education;Balance training;Therapeutic exercise;Manual techniques    PT Goals (Current goals can be found in the Care Plan section)  Acute Rehab PT Goals Patient Stated Goal: to return home PT Goal Formulation: With patient Time For Goal Achievement: 06/25/21 Potential to Achieve Goals: Fair    Frequency Min 2X/week     Co-evaluation               AM-PAC PT "6 Clicks" Mobility  Outcome Measure Help needed turning from your back to your side while in a flat bed without using bedrails?: A Little Help needed moving from lying on your back to sitting on the side of a flat bed without using bedrails?: A Little Help needed moving to and from a bed to a chair (including a wheelchair)?: A Little Help needed standing up from a chair using your arms (e.g., wheelchair or bedside chair)?: A Little Help needed to walk in hospital room?: A Little Help needed climbing 3-5 steps with a railing? : A Lot 6 Click Score: 17    End of Session Equipment Utilized During Treatment: Gait belt Activity Tolerance: Patient tolerated treatment well Patient left: with call bell/phone within reach;in chair;with chair alarm  set Nurse Communication: Mobility status PT Visit Diagnosis: Unsteadiness on feet (R26.81);Difficulty in walking, not elsewhere classified (R26.2);Other abnormalities of gait and mobility (R26.89);Muscle weakness (generalized) (M62.81);Pain Pain - Right/Left: Left Pain - part of body: Shoulder    Time: 7989-2119 PT Time Calculation (min) (ACUTE ONLY): 41 min   Charges:   PT Evaluation $PT Eval Moderate Complexity: 1 Mod PT Treatments $Therapeutic Exercise: 8-22 mins $Therapeutic Activity: 23-37  mins        Janna Arch, PT, DPT  06/11/2021, 1:13 PM

## 2021-06-11 NOTE — Progress Notes (Signed)
°  Progress Note   Patient: Chelsey Williams EKC:[id number] DOB: 1927-06-10 DOA: 06/10/2021     1 DOS: the patient was seen and examined on 06/11/2021   Brief hospital course: 88 y.o. female with medical history significant of PAF on Eliquis, PPM, nonobstructive CAD, Crohn's disease, COPD, HTN, HLD, came with increasing shortness of breath and leg swelling  2/18: Episode of tingling and numbness around the mouth and metallic taste/burning in her mouth with some throat hurting earlier this morning thought to be from Tikosyn.  Symptoms improving with Benadryl and stopping Tikosyn   Assessment and Plan: * Acute on chronic diastolic CHF (congestive heart failure) ([place])- (present on admission) Continue Lasix 40 mg IV daily Net IO Since Admission: -2,760 mL [06/11/21 1216]  Crohn's disease ([place])- (present on admission) She has an outpatient follow-up with Dr. Marius Ditch next Wednesday  COPD (chronic obstructive pulmonary disease) ([place])- (present on admission) Stable  Episodic atrial fibrillation ([place])- (present on admission) Continue Eliquis for anticoagulation.  Cardizem and metoprolol for rate control        Subjective: Seems somewhat scared may be anxious due to early morning incident of possible anaphylactic reaction to Tikosyn.  Symptoms now almost resolved after Benadryl and stopping Tikosyn.  Physical Exam: Vitals:   06/11/21 0500 06/11/21 0646 06/11/21 0809 06/11/21 1133  BP:  139/86 122/78 123/67  Pulse:  (!) 102 (!) 109 96  Resp:  19 18 18   Temp:  98.2 F (36.8 C) 99.2 F (37.3 C) 98.2 F (36.8 C)  TempSrc:  Oral    SpO2:  94% 96% 94%  Weight: 72 kg     Height: 5' 3"  (1.6 m)      Eyes: PERRL, lids and conjunctivae normal ENMT: Mucous membranes are moist. Posterior pharynx clear of any exudate or lesions.Normal dentition.  Neck: normal, supple, no masses, no thyromegaly Respiratory: clear to auscultation bilaterally, no wheezing, No accessory muscle use.  Cardiovascular:  Irregular rhythm, no murmurs / rubs / gallops.  2+ extremity edema. 2+ pedal pulses. No carotid bruits.  Abdomen: no tenderness, no masses palpated. No hepatosplenomegaly. Bowel sounds positive.  Musculoskeletal: no clubbing / cyanosis. No joint deformity upper and lower extremities. Good ROM, no contractures. Normal muscle tone.  Skin: no rashes, lesions, ulcers. No induration Neurologic: CN 2-12 grossly intact. Sensation intact, DTR normal. Strength 5/5 in all 4.  Psychiatric: Normal judgment and insight. Alert and oriented x 3. Normal mood.   Data Reviewed:  Potassium 3.4  Family Communication: Updated son over phone  Disposition: Status is: Inpatient Remains inpatient appropriate because: Getting diuresed      DVT prophylaxis-Eliquis     Planned Discharge Destination: Home with Home Health     Time spent: 55 Minutes  Author: Max Sane, MD 06/11/2021 12:18 PM  For on call review [email or web address].

## 2021-06-11 NOTE — Progress Notes (Signed)
Vital signs at end of shift  Patient is alert and oriented and appears physically stable. Heart rates in the upper 100's   06/11/21 0646  Vitals  Temp 98.2 F (36.8 C)  Temp Source Oral  BP 139/86  MAP (mmHg) 93  BP Location Right Arm  BP Method Automatic  Patient Position (if appropriate) Sitting  Pulse Rate (!) 102  Pulse Rate Source Monitor  ECG Heart Rate 95  Resp 19  MEWS COLOR  MEWS Score Color Nyoka Cowden

## 2021-06-11 NOTE — Progress Notes (Signed)
*  PRELIMINARY RESULTS* Echocardiogram 2D Echocardiogram has been performed.  Chelsey Williams 06/11/2021, 3:16 PM

## 2021-06-11 NOTE — Evaluation (Signed)
Occupational Therapy Evaluation Patient Details Name: Chelsey Williams MRN: [id number] DOB: August 10, 1927 Today's Date: 06/11/2021   History of Present Illness Patient is a 88 year old female who presented to ED with shortness of breath, generalized weakness, and bilateral LE swelling. States she was on budesonide for Crohn's but states she is allergic. PMH includes Crohn's Disease, macular degeneration, GERD, A fib, CAD, status post pacemaker insertion for sinus node dysfunction, and anxiety.   Clinical Impression   Pt seen for OT evaluation this date. She is alert and oriented and pleasant throughout. She lives with son and is MOD I at baseline, She requires SUPV with ADLs/Mobility today for energy conservation cueing when she is noted to get SOB. She is otherwise close to fxl baseline. OT engages pt in LB dressing with MOD I for increased time and modified technique and fxl mobility around nursing station with cues to initiate rest. Will continue to follow acutely, but do not anticipate need for OT f/u.      Recommendations for follow up therapy are one component of a multi-disciplinary discharge planning process, led by the attending physician.  Recommendations may be updated based on patient status, additional functional criteria and insurance authorization.   Follow Up Recommendations  No OT follow up    Assistance Recommended at Discharge Frequent or constant Supervision/Assistance  Patient can return home with the following Assistance with cooking/housework;Help with stairs or ramp for entrance;Assist for transportation    Functional Status Assessment  Patient has had a recent decline in their functional status and demonstrates the ability to make significant improvements in function in a reasonable and predictable amount of time.  Equipment Recommendations  Tub/shower seat    Recommendations for Other Services       Precautions / Restrictions Precautions Precautions: Fall Precaution  Comments: requires RW and has incontinance Restrictions Weight Bearing Restrictions: No Other Position/Activity Restrictions: has pain in L shoulder      Mobility Bed Mobility               General bed mobility comments: seated EOB pre/post    Transfers Overall transfer level: Needs assistance Equipment used: Rolling walker (2 wheels) Transfers: Sit to/from Stand Sit to Stand: Supervision           General transfer comment: good control, no LOB or sway, safe use of RW      Balance Overall balance assessment: Needs assistance Sitting-balance support: Feet supported, No upper extremity supported Sitting balance-Leahy Scale: Good     Standing balance support: Bilateral upper extremity supported, Reliant on assistive device for balance, During functional activity Standing balance-Leahy Scale: Fair Standing balance comment: RW for fxl mobility                           ADL either performed or assessed with clinical judgement   ADL Overall ADL's : Modified independent                                             Vision Patient Visual Report: No change from baseline       Perception     Praxis      Pertinent Vitals/Pain Pain Assessment Pain Assessment: 0-10 Pain Score: 8  Pain Location: L shoulder Pain Descriptors / Indicators: Sore Pain Intervention(s): Monitored during session     Hand Dominance Right  Extremity/Trunk Assessment Upper Extremity Assessment Upper Extremity Assessment: RUE deficits/detail;LUE deficits/detail RUE Deficits / Details: grossly WFL LUE Deficits / Details: limited to 1/3 range shld flexion and abd d/t pain, MD aware from PT   Lower Extremity Assessment Lower Extremity Assessment: Defer to PT evaluation;Generalized weakness       Communication Communication Communication: No difficulties   Cognition Arousal/Alertness: Awake/alert Behavior During Therapy: WFL for tasks  assessed/performed Overall Cognitive Status: Within Functional Limits for tasks assessed                                 General Comments: A and O x 4; HOH     General Comments  Patient appears well nourished and well groomed    Exercises Other Exercises Other Exercises: OT ed re: role   Shoulder Instructions      Home Living Family/patient expects to be discharged to:: Private residence Living Arrangements: Children (lives with son) Available Help at Discharge: Family Type of Home: House Home Access: Stairs to enter Technical brewer of Steps: 11   Home Layout: One level     Bathroom Shower/Tub: Walk-in shower         Home Equipment: Conservation officer, nature (2 wheels);Rollator (4 wheels);Shower seat;BSC/3in1          Prior Functioning/Environment Prior Level of Function : Needs assist             Mobility Comments: uses AD at baseline, walks with his son for shopping ADLs Comments: Can do laundry, son does the majority of the housework.        OT Problem List: Decreased activity tolerance;Cardiopulmonary status limiting activity      OT Treatment/Interventions: Self-care/ADL training;Energy conservation    OT Goals(Current goals can be found in the care plan section) Acute Rehab OT Goals Patient Stated Goal: to go home OT Goal Formulation: With patient Time For Goal Achievement: 06/25/21 Potential to Achieve Goals: Good ADL Goals Additional ADL Goal #1: 3 EC strategies with ADL routines with no VC  OT Frequency: Min 2X/week    Co-evaluation              AM-PAC OT "6 Clicks" Daily Activity     Outcome Measure Help from another person eating meals?: None Help from another person taking care of personal grooming?: None Help from another person toileting, which includes using toliet, bedpan, or urinal?: A Little Help from another person bathing (including washing, rinsing, drying)?: A Little Help from another person to put on and  taking off regular upper body clothing?: None Help from another person to put on and taking off regular lower body clothing?: A Little 6 Click Score: 21   End of Session Equipment Utilized During Treatment: Rolling walker (2 wheels) Nurse Communication: Mobility status  Activity Tolerance: Patient tolerated treatment well Patient left: in bed;with call bell/phone within reach  OT Visit Diagnosis: Muscle weakness (generalized) (M62.81)                Time: 7867-5449 OT Time Calculation (min): 22 min Charges:  OT General Charges $OT Visit: 1 Visit OT Evaluation $OT Eval Low Complexity: 1 Low OT Treatments $Self Care/Home Management : 8-22 mins  Gerrianne Scale, [place], OTR/L ascom [phone] 06/11/21, 4:34 PM

## 2021-06-11 NOTE — Assessment & Plan Note (Signed)
Continue Lasix 40 mg IV daily Net IO Since Admission: -2,760 mL [06/11/21 1216]

## 2021-06-11 NOTE — Hospital Course (Signed)
88 y.o. female with medical history significant of PAF on Eliquis, PPM, nonobstructive CAD, Crohn's disease, COPD, HTN, HLD, came with increasing shortness of breath and leg swelling  2/18: Episode of tingling and numbness around the mouth and metallic taste/burning in her mouth with some throat hurting earlier this morning thought to be from Tikosyn.  Symptoms improving with Benadryl and stopping Tikosyn

## 2021-06-11 NOTE — Assessment & Plan Note (Signed)
Stable

## 2021-06-11 NOTE — Progress Notes (Signed)
20 G IV inserted in Left Wrist.

## 2021-06-12 ENCOUNTER — Emergency Department
Admission: EM | Admit: 2021-06-12 | Discharge: 2021-06-12 | Disposition: A | Discharge status: Home / Self Care | Payer: Medicare Other | Attending: Emergency Medicine | Admitting: Emergency Medicine

## 2021-06-12 ENCOUNTER — Emergency Department: Payer: Medicare Other

## 2021-06-12 ENCOUNTER — Other Ambulatory Visit: Payer: Self-pay

## 2021-06-12 DIAGNOSIS — S29001A Unspecified injury of muscle and tendon of front wall of thorax, initial encounter: Secondary | ICD-10-CM | POA: Insufficient documentation

## 2021-06-12 DIAGNOSIS — Z7901 Long term (current) use of anticoagulants: Secondary | ICD-10-CM | POA: Insufficient documentation

## 2021-06-12 DIAGNOSIS — S20212A Contusion of left front wall of thorax, initial encounter: Secondary | ICD-10-CM

## 2021-06-12 DIAGNOSIS — W19XXXA Unspecified fall, initial encounter: Secondary | ICD-10-CM

## 2021-06-12 DIAGNOSIS — R52 Pain, unspecified: Secondary | ICD-10-CM

## 2021-06-12 DIAGNOSIS — R609 Edema, unspecified: Principal | ICD-10-CM

## 2021-06-12 DIAGNOSIS — R0602 Shortness of breath: Secondary | ICD-10-CM

## 2021-06-12 DIAGNOSIS — G9389 Other specified disorders of brain: Secondary | ICD-10-CM | POA: Insufficient documentation

## 2021-06-12 DIAGNOSIS — W01198A Fall on same level from slipping, tripping and stumbling with subsequent striking against other object, initial encounter: Secondary | ICD-10-CM | POA: Insufficient documentation

## 2021-06-12 DIAGNOSIS — I2510 Atherosclerotic heart disease of native coronary artery without angina pectoris: Secondary | ICD-10-CM | POA: Insufficient documentation

## 2021-06-12 LAB — ECHOCARDIOGRAM COMPLETE
AR max vel: 1.2 cm2
AV Area VTI: 1.22 cm2
AV Area mean vel: 1.27 cm2
AV Mean grad: 11 mmHg
AV Peak grad: 24.6 mmHg
Ao pk vel: 2.48 m/s
Area-P 1/2: 3.36 cm2
Height: 63 in
P 1/2 time: 491 msec
S' Lateral: 2.99 cm
Weight: 2539.7 oz

## 2021-06-12 LAB — CBC
HCT: 39 % (ref 36.0–46.0)
Hemoglobin: 12.4 g/dL (ref 12.0–15.0)
MCH: 28.5 pg (ref 26.0–34.0)
MCHC: 31.8 g/dL (ref 30.0–36.0)
MCV: 89.7 fL (ref 80.0–100.0)
Platelets: 390 10*3/uL (ref 150–400)
RBC: 4.35 MIL/uL (ref 3.87–5.11)
RDW: 15.3 % (ref 11.5–15.5)
WBC: 8.3 10*3/uL (ref 4.0–10.5)
nRBC: 0 % (ref 0.0–0.2)

## 2021-06-12 LAB — BASIC METABOLIC PANEL
Anion gap: 8 (ref 5–15)
BUN: 18 mg/dL (ref 8–23)
CO2: 27 mmol/L (ref 22–32)
Calcium: 8.9 mg/dL (ref 8.9–10.3)
Chloride: 102 mmol/L (ref 98–111)
Creatinine, Ser: 0.82 mg/dL (ref 0.44–1.00)
GFR, Estimated: >60 mL/min (ref >60)
Glucose, Bld: 109 mg/dL — ABNORMAL HIGH (ref 70–99)
Potassium: 4.3 mmol/L (ref 3.5–5.1)
Sodium: 137 mmol/L (ref 135–145)

## 2021-06-12 LAB — MAGNESIUM: Magnesium: 1.9 mg/dL (ref 1.7–2.4)

## 2021-06-12 MED ORDER — METOPROLOL TARTRATE 50 MG PO TABS
50.0000 mg | ORAL_TABLET | Freq: Two times a day (BID) | ORAL | 0 refills | Status: AC
Start: 2021-06-12 — End: 2021-07-12

## 2021-06-12 MED ORDER — ENSURE ENLIVE PO LIQD: 237.0000 mL | Freq: Two times a day (BID) | ORAL | 12 refills | Status: AC

## 2021-06-12 MED ORDER — TRAMADOL HCL 50 MG PO TABS: 50.0000 mg | ORAL_TABLET | Freq: Four times a day (QID) | ORAL | 0 refills | Status: AC | PRN

## 2021-06-12 MED ORDER — FUROSEMIDE 20 MG PO TABS: 20.0000 mg | ORAL_TABLET | Freq: Two times a day (BID) | ORAL | 0 refills | Status: AC

## 2021-06-12 NOTE — ED Notes (Signed)
Pt discharge information reviewed. Pt understands need for follow up care and when to return if symptoms worsen. All questions answered. Pt is alert and oriented with even and regular respirations. Pt is brought out of department in wheelchair by family  °

## 2021-06-12 NOTE — Consult Note (Signed)
[place] for Electrolyte Monitoring and Replacement   Recent Labs: Potassium (mmol/L)  Date Value  06/12/2021 4.3   Magnesium (mg/dL)  Date Value  06/12/2021 1.9   Calcium (mg/dL)  Date Value  06/12/2021 8.9   Albumin (g/dL)  Date Value  03/31/2021 3.8   Sodium (mmol/L)  Date Value  06/12/2021 137  05/22/2017 142   Assessment: Patient is a 88 y/o F with medical history including PAF on Eliquis, PPM, non-obstructive CAD, Crohn's disease, COPD, HTN, HLD who is admitted with CHF exacerbation. Tikosyn discontinued. Pharmacy consulted to assist with electrolyte monitoring and replacement as indicated.   Diuretics: IV Lasix 40 mg daily  Goal of Therapy:  K >= 4 Mg >= 2 All other electrolytes within normal limits  Plan:  Continue Kcl 40 mEq daily.  --Follow-up electrolytes with AM labs tomorrow  Oswald Hillock 06/12/2021 7:45 AM

## 2021-06-12 NOTE — TOC Transition Note (Signed)
Transition of Care [facility]) - CM/SW Discharge Note   Patient Details  Name: Chelsey Williams MRN: [id number] Date of Birth: [birth year]  Transition of Care [facility]) CM/SW Contact:  Harriet Masson, RN Phone Number:[phone] 06/12/2021, 9:09 AM   Clinical Narrative:    Spoke with POA Son Chelsey Williams who has spoken with the pt concerning all recommendations for [facility]. In additional to services with Palliative. At this time pt and POA son Chelsey Williams are declining all services recommended. Team and [place] made aware.  No additional needs presented at this time.   Final next level of care: [place] Barriers to Discharge: Continued Medical Work up   Patient Goals and CMS Choice     Choice offered to / list presented to : Adult Children  Discharge Placement                       Discharge Plan and Services   Discharge Planning Services: CM Consult                      HH Arranged: RN, PT, OT [facility] Agency: [place] ([place]) Date [place]: 06/11/21 Time [place]: [place] Representative spoke with at [place]: [place] ([place]) Interventions     Readmission Risk Interventions No flowsheet data found.

## 2021-06-12 NOTE — ED Triage Notes (Addendum)
Report per EMS. [place] today and landed on left side.  C/o of left arm pain. States her arm was hurting prior but is now worse after fall. Did not hit head. No loss of consciousness. Takes eliquis. No deformity noted. Was just discharged from hospital today after being admitted. Pacemaker in place

## 2021-06-12 NOTE — Discharge Summary (Signed)
Physician Discharge Summary   Patient: Chelsey Williams MRN: [id number] DOB: 1927-12-01  Admit date:     06/10/2021  Discharge date: 06/12/21  Discharge Physician: Max Sane   PCP: Briant Sites, DO   Recommendations at discharge:   Follow-up with outpatient providers as requested  Discharge Diagnoses: Principal Problem:   Acute on chronic diastolic CHF (congestive heart failure) (HCC) Active Problems:   Episodic atrial fibrillation (HCC)   COPD (chronic obstructive pulmonary disease) (HCC)   Crohn's disease (HCC)   CHF (congestive heart failure) (HCC)   Peripheral edema   Shortness of breath   Pain   Hospital Course: 88 y.o. female with medical history significant of PAF on Eliquis, PPM, nonobstructive CAD, Crohn's disease, COPD, HTN, HLD, came with increasing shortness of breath and leg swelling  2/18: Episode of tingling and numbness around the mouth and metallic taste/burning in her mouth with some throat hurting earlier this morning thought to be from Tikosyn.  Symptoms improving with Benadryl and stopping Tikosyn  Assessment and Plan: * Acute on chronic diastolic CHF (congestive heart failure) ([place])- (present on admission) Diuresed well with Lasix Net IO Since Admission: -2,970 mL [06/12/21 1252]   Crohn's disease ([place])- (present on admission) She has an outpatient follow-up with Dr. Marius Ditch next Wednesday on 2/22  COPD (chronic obstructive pulmonary disease) ([place])- (present on admission) Stable  Episodic atrial fibrillation ([place])- (present on admission) Continue Eliquis for anticoagulation.  Cardizem and metoprolol for rate control           Consultants: Cardiology Disposition: Home Diet recommendation:  Discharge Diet Orders (From admission, onward)     Start     Ordered   06/12/21 0000  Diet - low sodium heart healthy        06/12/21 0850           Carb modified diet  DISCHARGE MEDICATION: Allergies as of 06/12/2021       Reactions    Cephalexin Anaphylaxis   Shock / Unconsciousness   Hydrochlorothiazide Other (See Comments)   dizziness dizziness   Morphine And Related Shortness Of Breath   Povidone-iodine Itching   Prednisone Shortness Of Breath   SOB   Felodipine Nausea Only   Gastrointestinal problems, e.g., nausea, vomiting, diarrheaShock / Unconsciousnessmuscle tenderness   Magnesium-containing Compounds    Diarrhea   Other Other (See Comments)   A blood pressure med STEROIDS   Quinolones    Aortic aneurysm    Statins Other (See Comments)   Amlodipine Other (See Comments), Palpitations   Atenolol Palpitations   heart palpitations        Medication List     STOP taking these medications    CALCIUM-VITAMIN D3 PO   dofetilide 250 MCG capsule Commonly known as: TIKOSYN   ferrous sulfate 325 (65 FE) MG tablet   Hyoscyamine Sulfate SL 0.125 MG Subl       TAKE these medications    acetaminophen 325 MG tablet Commonly known as: TYLENOL Take 325 mg by mouth every 4 (four) hours as needed.   albuterol 108 (90 Base) MCG/ACT inhaler Commonly known as: VENTOLIN HFA Inhale 2 puffs into the lungs every 6 (six) hours as needed for wheezing or shortness of breath.   apixaban 5 MG Tabs tablet Commonly known as: Eliquis Take 1 tablet (5 mg total) by mouth 2 (two) times daily.   budesonide 3 MG 24 hr capsule Commonly known as: ENTOCORT EC Take 3 capsules (9 mg total) by mouth daily.   calcium  carbonate 500 MG chewable tablet Commonly known as: TUMS - dosed in mg elemental calcium Chew 1 tablet by mouth every other day.   Cholecalciferol 50 MCG (2000 UT) Tabs Take 2,000 Units by mouth daily at 12 noon.   cholestyramine 4 g packet Commonly known as: QUESTRAN Take by mouth 2 (two) times daily.   cyanocobalamin 1000 MCG tablet Take 1 tablet (1,000 mcg total) by mouth daily.   cyanocobalamin 1000 MCG/ML injection Commonly known as: (VITAMIN B-12) Inject 1,000 mcg into the muscle every 30  (thirty) days.   diltiazem 180 MG 24 hr capsule Commonly known as: TIAZAC Take 180 mg by mouth 2 (two) times daily.   Eylea 2 MG/0.05ML Soln Generic drug: Aflibercept Apply to eye.   feeding supplement Liqd Take 237 mLs by mouth 2 (two) times daily between meals.   furosemide 20 MG tablet Commonly known as: Lasix Take 1 tablet (20 mg total) by mouth 2 (two) times daily.   ipratropium 17 MCG/ACT inhaler Commonly known as: ATROVENT HFA Inhale 2 puffs into the lungs 4 (four) times daily.   Lecithin 1200 MG Caps Take 1,200 mg by mouth daily at 12 noon.   loperamide 2 MG capsule Commonly known as: IMODIUM Take 2 mg by mouth as needed for diarrhea or loose stools.   magnesium (amino acid chelate) 133 MG tablet Take 2 tablets by mouth daily.   metoprolol tartrate 50 MG tablet Commonly known as: LOPRESSOR Take 1 tablet (50 mg total) by mouth 2 (two) times daily.   olopatadine 0.1 % ophthalmic solution Commonly known as: PATANOL Place 1 drop into the right eye 2 (two) times daily.   omeprazole 20 MG capsule Commonly known as: PRILOSEC Take 20 mg by mouth daily as needed.   potassium chloride SA 20 MEQ tablet Commonly known as: KLOR-CON M Take 20 mEq by mouth daily.   psyllium 58.6 % packet Commonly known as: METAMUCIL Take 1 packet by mouth daily as needed.   senna-docusate 8.6-50 MG tablet Commonly known as: Senokot-S Take 2 tablets by mouth 2 (two) times daily as needed for constipation.   traMADol 50 MG tablet Commonly known as: ULTRAM Take 1 tablet (50 mg total) by mouth every 6 (six) hours as needed for up to 3 days for severe pain or moderate pain.        Follow-up Information     Briant Sites, DO. Schedule an appointment as soon as possible for a visit in 3 day(s).   Specialty: Family Medicine Why: [facility] - Closed Discharge F/UP Contact information: [address] [place] [address] [phone]         Donovan Kail, NP. Schedule an appointment as soon as possible for a visit in 1 week(s).   Specialty: Cardiology Why: [facility] Discharge F/UP Contact information: [address] [place] [address] [place] [address] [phone]                 Discharge Exam: Filed Weights   06/10/21 1208 06/11/21 0500 06/12/21 0500  Weight: 72.1 kg 72 kg 71.4 kg   Eyes: PERRL, lids and conjunctivae normal ENMT: Mucous membranes are moist. Posterior pharynx clear of any exudate or lesions.Normal dentition.  Neck: normal, supple, no masses, no thyromegaly Respiratory: clear to auscultation bilaterally, no wheezing, No accessory muscle use.  Cardiovascular: S1-S2 normal, no murmurs / rubs / gallops.  1+ extremity edema.  Abdomen: no tenderness, no masses palpated. No hepatosplenomegaly. Bowel sounds positive.  Musculoskeletal:  no clubbing / cyanosis. No joint deformity upper and lower extremities. Good ROM, no contractures. Normal muscle tone.  Skin: no rashes, lesions, ulcers. No induration Neurologic: CN 2-12 grossly intact. Sensation intact, DTR normal. Strength 5/5 in all 4.  Psychiatric: Normal judgment and insight. Alert and oriented x 3. Normal mood.   Condition at discharge: fair  The results of significant diagnostics from this hospitalization (including imaging, microbiology, ancillary and laboratory) are listed below for reference.   Imaging Studies: DG Chest 2 View  Result Date: 06/10/2021 CLINICAL DATA:  Chest pain. Patient reports stopping steroid budesonide due to reaction. Swollen lips. Stopped medicine 10 days ago. EXAM: CHEST - 2 VIEW COMPARISON:  06/15/2018 FINDINGS:: FINDINGS: Left chest wall cardiac pacer leads again overlie the right atrium and right ventricle. Moderate calcification within the aortic arch. Surgical clips overlie the superior, slightly right hemithorax. Cardiac silhouette is again mildly enlarged. Mediastinal contours are within normal limits with moderate  calcification within the aortic arch. Mildly increased interstitial thickening. No definite pleural effusion. No pneumothorax. Moderate multilevel degenerative disc changes of the midthoracic spine. IMPRESSION: Increased mild bilateral interstitial thickening diffusely. This is compatible with mild interstitial pulmonary edema. Electronically Signed   By: Yvonne Kendall M.D.   On: 06/10/2021 12:46   DG Shoulder Left  Result Date: 06/11/2021 PACEMAKER.  CLINICAL DATA: Left shoulder pain, limited range of motion.  No known injury. EXAM: LEFT SHOULDER - 2+ VIEW COMPARISON:  None. FINDINGS: There is no evidence of fracture or dislocation. 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 Partially visualized left chest wall IMPRESSION: No acute osseous abnormality. 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 Electronically Signed   By: Ileana Roup M.D.   On: 06/11/2021 15:58   ECHOCARDIOGRAM COMPLETE  Result Date: 06/12/2021    ECHOCARDIOGRAM REPORT   Patient Name:   KABRINA CHRISTIANO Date of Exam: 06/11/2021 Medical Rec #:  [id number]    Height:       63.0 in Accession #:    [id number]   Weight:       158.7 lb Date of Birth:  [birth year]     BSA:          1.753 m Patient Age:    88 years     BP:           139/86 mmHg Patient Gender: F            HR:           102 bpm. Exam Location:  [facility] Procedure: 2D Echo, Cardiac Doppler and Color Doppler Indications:     CHF-Acute Diastolic I34.74  History:         Patient has no prior history of Echocardiogram examinations.                  CAD; Arrythmias:Atrial Fibrillation.  Sonographer:     Alyse Low Roar Referring Phys:  [place] Diagnosing Phys: Serafina Royals MD IMPRESSIONS  1. Left ventricular ejection fraction, by estimation, is 60 to 65%. The left ventricle has normal function. The left ventricle has no regional wall motion abnormalities. There is mild left ventricular hypertrophy. Left ventricular diastolic parameters  are consistent with Grade II diastolic dysfunction (pseudonormalization).  2. Right ventricular systolic function is normal. The right ventricular size is normal.  3. Left atrial size was moderately dilated.  4. The mitral valve is normal in structure. Moderate mitral valve regurgitation.  5. The aortic valve is calcified. Aortic valve regurgitation is  mild. Mild aortic valve stenosis. FINDINGS  Left Ventricle: Left ventricular ejection fraction, by estimation, is 60 to 65%. The left ventricle has normal function. The left ventricle has no regional wall motion abnormalities. The left ventricular internal cavity size was normal in size. There is  mild left ventricular hypertrophy. Left ventricular diastolic parameters are consistent with Grade II diastolic dysfunction (pseudonormalization). Right Ventricle: The right ventricular size is normal. No increase in right ventricular wall thickness. Right ventricular systolic function is normal. Left Atrium: Left atrial size was moderately dilated. Right Atrium: Right atrial size was normal in size. Pericardium: There is no evidence of pericardial effusion. Mitral Valve: The mitral valve is normal in structure. Moderate mitral valve regurgitation. Tricuspid Valve: The tricuspid valve is normal in structure. Tricuspid valve regurgitation is mild. Aortic Valve: The aortic valve is calcified. Aortic valve regurgitation is mild. Aortic regurgitation PHT measures 491 msec. Mild aortic stenosis is present. Aortic valve mean gradient measures 11.0 mmHg. Aortic valve peak gradient measures 24.6 mmHg. Aortic valve area, by VTI measures 1.22 cm. Pulmonic Valve: The pulmonic valve was normal in structure. Pulmonic valve regurgitation is not visualized. Aorta: The aortic root and ascending aorta are structurally normal, with no evidence of dilitation. IAS/Shunts: No atrial level shunt detected by color flow Doppler.  LEFT VENTRICLE PLAX 2D LVIDd:         4.22 cm   Diastology LVIDs:          2.99 cm   LV e' medial:    5.00 cm/s LV PW:         1.05 cm   LV E/e' medial:  26.0 LV IVS:        1.33 cm   LV e' lateral:   10.40 cm/s LVOT diam:     1.80 cm   LV E/e' lateral: 12.5 LV SV:         55 LV SV Index:   32 LVOT Area:     2.54 cm  RIGHT VENTRICLE RV Mid diam:    2.40 cm RV S prime:     11.90 cm/s TAPSE (M-mode): 1.8 cm LEFT ATRIUM             Index        RIGHT ATRIUM           Index LA diam:        4.30 cm 2.45 cm/m   RA Area:     14.40 cm LA Vol (A2C):   76.0 ml 43.36 ml/m  RA Volume:   33.00 ml  18.83 ml/m LA Vol (A4C):   68.7 ml 39.20 ml/m LA Biplane Vol: 72.6 ml 41.42 ml/m  AORTIC VALVE                     PULMONIC VALVE AV Area (Vmax):    1.20 cm      PV Vmax:          0.96 m/s AV Area (Vmean):   1.27 cm      PV Peak grad:     3.7 mmHg AV Area (VTI):     1.22 cm      PR End Diast Vel: 4.41 msec AV Vmax:           248.00 cm/s   RVOT Peak grad:   2 mmHg AV Vmean:          148.000 cm/s AV VTI:            0.454 m  AV Peak Grad:      24.6 mmHg AV Mean Grad:      11.0 mmHg LVOT Vmax:         117.00 cm/s LVOT Vmean:        73.600 cm/s LVOT VTI:          0.217 m LVOT/AV VTI ratio: 0.48 AI PHT:            491 msec  AORTA Ao Root diam: 2.90 cm Ao Asc diam:  3.30 cm MITRAL VALVE                TRICUSPID VALVE MV Area (PHT): 3.36 cm     TR Peak grad:   27.5 mmHg MV Decel Time: 226 msec     TR Vmax:        262.00 cm/s MV E velocity: 130.00 cm/s                             SHUNTS                             Systemic VTI:  0.22 m                             Systemic Diam: 1.80 cm Serafina Royals MD Electronically signed by Serafina Royals MD Signature Date/Time: 06/12/2021/8:52:17 AM    Final     Microbiology: Results for orders placed or performed during the hospital encounter of 06/10/21  Resp Panel by RT-PCR (Flu A&B, Covid) Nasopharyngeal Swab     Status: None   Collection Time: 06/10/21  2:32 PM   Specimen: Nasopharyngeal Swab; Nasopharyngeal(NP) swabs in vial transport medium  Result  Value Ref Range Status   SARS Coronavirus 2 by RT PCR NEGATIVE NEGATIVE Final    Comment: (NOTE) SARS-CoV-2 target nucleic acids are NOT DETECTED.  The SARS-CoV-2 RNA is generally detectable in upper respiratory specimens during the acute phase of infection. The lowest concentration of SARS-CoV-2 viral copies this assay can detect is 138 copies/mL. A negative result does not preclude SARS-Cov-2 infection and should not be used as the sole basis for treatment or other patient management decisions. A negative result may occur with  improper specimen collection/handling, submission of specimen other than nasopharyngeal swab, presence of viral mutation(s) within the areas targeted by this assay, and inadequate number of viral copies(<138 copies/mL). A negative result must be combined with clinical observations, patient history, and epidemiological information. The expected result is Negative.  Fact Sheet for Patients:  EntrepreneurPulse.com.au  Fact Sheet for Healthcare Providers:  IncredibleEmployment.be  This test is no t yet approved or cleared by the [place] FDA and  has been authorized for detection and/or diagnosis of SARS-CoV-2 by FDA under an Emergency Use Authorization (EUA). This EUA will remain  in effect (meaning this test can be used) for the duration of the COVID-19 declaration under Section 564(b)(1) of the Act, 21 U.S.C.section 360bbb-3(b)(1), unless the authorization is terminated  or revoked sooner.       Influenza A by PCR NEGATIVE NEGATIVE Final   Influenza B by PCR NEGATIVE NEGATIVE Final    Comment: (NOTE) The Xpert Xpress SARS-CoV-2/FLU/RSV plus assay is intended as an aid in the diagnosis of influenza from Nasopharyngeal swab specimens and should not be used as a sole basis for treatment. Nasal washings and aspirates are  unacceptable for Xpert Xpress SARS-CoV-2/FLU/RSV testing.  Fact Sheet for  Patients: EntrepreneurPulse.com.au  Fact Sheet for Healthcare Providers: IncredibleEmployment.be  This test is not yet approved or cleared by the [place] FDA and has been authorized for detection and/or diagnosis of SARS-CoV-2 by FDA under an Emergency Use Authorization (EUA). This EUA will remain in effect (meaning this test can be used) for the duration of the COVID-19 declaration under Section 564(b)(1) of the Act, 21 U.S.C. section 360bbb-3(b)(1), unless the authorization is terminated or revoked.  Performed at [facility], [address] [place], [place] [address]     Labs: CBC: Recent Labs  Lab 06/10/21 1243 06/12/21 0454  WBC 6.5 8.3  HGB 11.6* 12.4  HCT 36.5 39.0  MCV 90.8 89.7  PLT 301 761   Basic Metabolic Panel: Recent Labs  Lab 06/10/21 1243 06/11/21 0451 06/12/21 0454  NA 135 139 137  K 3.5 3.4* 4.3  CL 102 102 102  CO2 28 30 27   GLUCOSE 132* 98 109*  BUN 17 14 18   CREATININE 0.72 0.69 0.82  CALCIUM 8.6* 8.4* 8.9  MG  --  1.8 1.9   Liver Function Tests: No results for input(s): AST, ALT, ALKPHOS, BILITOT, PROT, ALBUMIN in the last 168 hours. CBG: No results for input(s): GLUCAP in the last 168 hours.  Discharge time spent: greater than 30 minutes.  Signed: Max Sane, MD [facility] 06/12/2021

## 2021-06-12 NOTE — Progress Notes (Signed)
[facility] Encounter Note  Patient: Chelsey Williams / Admit Date: 06/10/2021 / Date of Encounter: 06/12/2021, 9:55 AM   Subjective: Patient is much improved today with less shortness of breath and no evidence of concerns of poss side effects of medications after metabolism of this medication.  It does appear that the Tikosyn was the culprit for the side effect of medication and now has been discontinued.  The patient otherwise had pulmonary edema significantly improved after furosemide and diuresis of 4 L.  Atrial fibrillation with rapid ventricular rate now has been spontaneously converted back to normal sinus rhythm.  The patient has body aches but no evidence of angina at this time  Review of Systems: Positive for: Body aches and shortness of breath Negative for: Vision change, hearing change, syncope, dizziness, nausea, vomiting,diarrhea, bloody stool, stomach pain, cough, congestion, diaphoresis, urinary frequency, urinary pain,skin lesions, skin rashes Others previously listed  Objective: Telemetry: Normal sinus rhythm with ventricular pacing Physical Exam: Blood pressure 113/61, pulse 86, temperature 97.7 F (36.5 C), resp. rate 18, height 5' 3"  (1.6 m), weight 71.4 kg, SpO2 97 %. Body mass index is 27.88 kg/m. General: Well developed, well nourished, in no acute distress. Head: Normocephalic, atraumatic, sclera non-icteric, no xanthomas, nares are without discharge. Neck: No apparent masses Lungs: Normal respirations with no wheezes, no rhonchi, no rales , few crackles   Heart: Regular rate and rhythm, normal S1 S2, no murmur, no rub, no gallop, PMI is normal size and placement, carotid upstroke normal without bruit, jugular venous pressure normal Abdomen: Soft, non-tender, non-distended with normoactive bowel sounds. No hepatosplenomegaly. Abdominal aorta is normal size without bruit Extremities: Trace edema, no clubbing, no cyanosis, no ulcers,  Peripheral: 2+  radial, 2+ femoral, 2+ dorsal pedal pulses Neuro: Alert and oriented. Moves all extremities spontaneously. Psych:  Responds to questions appropriately with a normal affect.   Intake/Output Summary (Last 24 hours) at 06/12/2021 0955 Last data filed at 06/12/2021 0441 Gross per 24 hour  Intake 480 ml  Output 1850 ml  Net -1370 ml    Inpatient Medications:   apixaban  5 mg Oral BID   diltiazem  60 mg Oral Q8H   feeding supplement  237 mL Oral BID BM   ferrous sulfate  325 mg Oral Q breakfast   furosemide  40 mg Intravenous Daily   magnesium chloride  1 tablet Oral Daily   metoprolol tartrate  50 mg Oral BID   olopatadine  1 drop Right Eye BID   pantoprazole  40 mg Oral Daily   potassium chloride SA  40 mEq Oral Daily   sodium chloride flush  3 mL Intravenous Q12H   cyanocobalamin  1,000 mcg Oral Daily   Infusions:   sodium chloride      Labs: Recent Labs    06/11/21 0451 06/12/21 0454  NA 139 137  K 3.4* 4.3  CL 102 102  CO2 30 27  GLUCOSE 98 109*  BUN 14 18  CREATININE 0.69 0.82  CALCIUM 8.4* 8.9  MG 1.8 1.9   No results for input(s): AST, ALT, ALKPHOS, BILITOT, PROT, ALBUMIN in the last 72 hours. Recent Labs    06/10/21 1243 06/12/21 0454  WBC 6.5 8.3  HGB 11.6* 12.4  HCT 36.5 39.0  MCV 90.8 89.7  PLT 301 390   Recent Labs    06/10/21 1428  CKTOTAL 127   Invalid input(s): POCBNP No results for input(s): HGBA1C in the last 72 hours.   Weights: [employer]  Weights   06/10/21 1208 06/11/21 0500 06/12/21 0500  Weight: 72.1 kg 72 kg 71.4 kg     Radiology/Studies:  DG Chest 2 View  Result Date: 06/10/2021 CLINICAL DATA:  Chest pain. Patient reports stopping steroid budesonide due to reaction. Swollen lips. Stopped medicine 10 days ago. EXAM: CHEST - 2 VIEW COMPARISON:  06/15/2018 FINDINGS:: FINDINGS: Left chest wall cardiac pacer leads again overlie the right atrium and right ventricle. Moderate calcification within the aortic arch. Surgical clips overlie  the superior, slightly right hemithorax. Cardiac silhouette is again mildly enlarged. Mediastinal contours are within normal limits with moderate calcification within the aortic arch. Mildly increased interstitial thickening. No definite pleural effusion. No pneumothorax. Moderate multilevel degenerative disc changes of the midthoracic spine. IMPRESSION: Increased mild bilateral interstitial thickening diffusely. This is compatible with mild interstitial pulmonary edema. Electronically Signed   By: Yvonne Kendall M.D.   On: 06/10/2021 12:46   DG Shoulder Left  Result Date: 06/11/2021 PACEMAKER.  CLINICAL DATA: Left shoulder pain, limited range of motion.  No known injury. EXAM: LEFT SHOULDER - 2+ VIEW COMPARISON:  None. FINDINGS: There is no evidence of fracture or dislocation. 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 Partially visualized left chest wall IMPRESSION: No acute osseous abnormality. 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 Electronically Signed   By: Ileana Roup M.D.   On: 06/11/2021 15:58   ECHOCARDIOGRAM COMPLETE  Result Date: 06/12/2021    ECHOCARDIOGRAM REPORT   Patient Name:   Chelsey Williams Date of Exam: 06/11/2021 Medical Rec #:  [id number]    Height:       63.0 in Accession #:    [id number]   Weight:       158.7 lb Date of Birth:  October 23, 1927     BSA:          1.753 m Patient Age:    88 years     BP:           139/86 mmHg Patient Gender: F            HR:           102 bpm. Exam Location:  [facility] Procedure: 2D Echo, Cardiac Doppler and Color Doppler Indications:     CHF-Acute Diastolic G66.59  History:         Patient has no prior history of Echocardiogram examinations.                  CAD; Arrythmias:Atrial Fibrillation.  Sonographer:     Alyse Low Roar Referring Phys:  [place] Diagnosing Phys: Serafina Royals MD IMPRESSIONS  1. Left ventricular ejection fraction, by estimation, is 60 to 65%. The left ventricle has normal  function. The left ventricle has no regional wall motion abnormalities. There is mild left ventricular hypertrophy. Left ventricular diastolic parameters are consistent with Grade II diastolic dysfunction (pseudonormalization).  2. Right ventricular systolic function is normal. The right ventricular size is normal.  3. Left atrial size was moderately dilated.  4. The mitral valve is normal in structure. Moderate mitral valve regurgitation.  5. The aortic valve is calcified. Aortic valve regurgitation is mild. Mild aortic valve stenosis. FINDINGS  Left Ventricle: Left ventricular ejection fraction, by estimation, is 60 to 65%. The left ventricle has normal function. The left ventricle has no regional wall motion abnormalities. The left ventricular internal cavity size was normal in size. There is  mild left ventricular hypertrophy. Left ventricular diastolic parameters are consistent with Grade II  diastolic dysfunction (pseudonormalization). Right Ventricle: The right ventricular size is normal. No increase in right ventricular wall thickness. Right ventricular systolic function is normal. Left Atrium: Left atrial size was moderately dilated. Right Atrium: Right atrial size was normal in size. Pericardium: There is no evidence of pericardial effusion. Mitral Valve: The mitral valve is normal in structure. Moderate mitral valve regurgitation. Tricuspid Valve: The tricuspid valve is normal in structure. Tricuspid valve regurgitation is mild. Aortic Valve: The aortic valve is calcified. Aortic valve regurgitation is mild. Aortic regurgitation PHT measures 491 msec. Mild aortic stenosis is present. Aortic valve mean gradient measures 11.0 mmHg. Aortic valve peak gradient measures 24.6 mmHg. Aortic valve area, by VTI measures 1.22 cm. Pulmonic Valve: The pulmonic valve was normal in structure. Pulmonic valve regurgitation is not visualized. Aorta: The aortic root and ascending aorta are structurally normal, with no  evidence of dilitation. IAS/Shunts: No atrial level shunt detected by color flow Doppler.  LEFT VENTRICLE PLAX 2D LVIDd:         4.22 cm   Diastology LVIDs:         2.99 cm   LV e' medial:    5.00 cm/s LV PW:         1.05 cm   LV E/e' medial:  26.0 LV IVS:        1.33 cm   LV e' lateral:   10.40 cm/s LVOT diam:     1.80 cm   LV E/e' lateral: 12.5 LV SV:         55 LV SV Index:   32 LVOT Area:     2.54 cm  RIGHT VENTRICLE RV Mid diam:    2.40 cm RV S prime:     11.90 cm/s TAPSE (M-mode): 1.8 cm LEFT ATRIUM             Index        RIGHT ATRIUM           Index LA diam:        4.30 cm 2.45 cm/m   RA Area:     14.40 cm LA Vol (A2C):   76.0 ml 43.36 ml/m  RA Volume:   33.00 ml  18.83 ml/m LA Vol (A4C):   68.7 ml 39.20 ml/m LA Biplane Vol: 72.6 ml 41.42 ml/m  AORTIC VALVE                     PULMONIC VALVE AV Area (Vmax):    1.20 cm      PV Vmax:          0.96 m/s AV Area (Vmean):   1.27 cm      PV Peak grad:     3.7 mmHg AV Area (VTI):     1.22 cm      PR End Diast Vel: 4.41 msec AV Vmax:           248.00 cm/s   RVOT Peak grad:   2 mmHg AV Vmean:          148.000 cm/s AV VTI:            0.454 m AV Peak Grad:      24.6 mmHg AV Mean Grad:      11.0 mmHg LVOT Vmax:         117.00 cm/s LVOT Vmean:        73.600 cm/s LVOT VTI:          0.217 m LVOT/AV VTI ratio: 0.48  AI PHT:            491 msec  AORTA Ao Root diam: 2.90 cm Ao Asc diam:  3.30 cm MITRAL VALVE                TRICUSPID VALVE MV Area (PHT): 3.36 cm     TR Peak grad:   27.5 mmHg MV Decel Time: 226 msec     TR Vmax:        262.00 cm/s MV E velocity: 130.00 cm/s                             SHUNTS                             Systemic VTI:  0.22 m                             Systemic Diam: 1.80 cm Serafina Royals MD Electronically signed by Serafina Royals MD Signature Date/Time: 06/12/2021/8:52:17 AM    Final      Assessment and Recommendation  88 y.o. female with known coronary artery disease hypertension hyperlipidemia paroxysmal nonvalvular atrial  fibrillation with acute on chronic diastolic dysfunction congestive heart failure atrial fibrillation with rapid ventricular rate and side effects of medication now all improved without evidence of acute coronary syndrome 1.  Continuation of diltiazem metoprolol combination for heart rate control and maintenance of normal sinus rhythm 2.  Anticoagulation for further risk reduction of stroke with atrial fibrillation 3.  Abstain from Tikosyn at this time due to significant side effects of the medication including oral tingling throat closing sensations although no evidence of primary respiratory distress 4.  Hypertension control with current medical regimen 5.  Continue furosemide and switch to 40 mg p.o. daily for continued treatment of diastolic dysfunction heart failure and pulmonary edema 60.  Begin ambulation and follow-up for improvements of symptoms and possible discharge home with follow-up in 1 week  Signed, Serafina Royals M.D. FACC

## 2021-06-12 NOTE — ED Notes (Signed)
Pt returned from CT °

## 2021-06-12 NOTE — ED Notes (Signed)
Son arrives at bedside

## 2021-06-12 NOTE — ED Provider Notes (Signed)
[facility] Provider Note    Event Date/Time   First MD Initiated Contact with Patient 06/12/21 1926     (approximate)  History   Chief Complaint: Fall  HPI  Chelsey Williams is a 88 y.o. female with a past medical history of CAD, gastric reflux, pacemaker, atrial fibrillation on Eliquis who presents to the emergency department after a fall.  According to the patient patient was discharged from the hospital earlier today after an admission for fluid overload.  Patient states she slipped on a rug causing her to fall to her left side essentially hitting the wall and sliding down.  States she did not hit her head.  No LOC.  No headache.  Patient does states some pain under the left arm/left lateral chest but no pain in the left arm.  Denies any neck pain or back pain.  Able to move all extremities well.  Physical Exam   Triage Vital Signs: ED Triage Vitals  Enc Vitals Group     BP 06/12/21 1930 (!) 154/88     Pulse Rate 06/12/21 1929 (!) 111     Resp 06/12/21 1929 20     Temp 06/12/21 1931 98.2 F (36.8 C)     Temp Source 06/12/21 1931 Oral     SpO2 06/12/21 1930 96 %     Weight --      Height --      Head Circumference --      Peak Flow --      Pain Score 06/12/21 1926 8     Pain Loc --      Pain Edu? --      Excl. in [place]? --     Most recent vital signs: Vitals:   06/12/21 1930 06/12/21 1931  BP: (!) 154/88   Pulse:    Resp:    Temp:  98.2 F (36.8 C)  SpO2: 96%     General: Awake, no distress.  CV:  Good peripheral perfusion.  Regular rate and rhythm  Resp:  Normal effort.  Equal breath sounds bilaterally.  Abd:  No distention.  Soft, nontender.  No rebound or guarding. Other:  Great range of motion bilateral lower extremities.  No concern for hip fracture.  Good range of motion bilateral upper extremities but does have pain/tenderness to left lateral chest.   ED Results / Procedures / Treatments   EKG  EKG viewed and interpreted by  myself shows atrial fibrillation at 109 bpm with a narrow QRS, normal axis, normal intervals, nonspecific but no concerning ST changes.  RADIOLOGY  I personally reviewed the CT scan of the head, no acute abnormalities my evaluation besides larger appearing ventricles. CT scan read by radiologist as generalized cerebral atrophy without any acute intracranial abnormality. CT scan of the chest shows no concerning findings.   MEDICATIONS ORDERED IN ED: Medications - No data to display   IMPRESSION / MDM / [place] / ED COURSE  I reviewed the triage vital signs and the nursing notes.  Patient presents to the emergency department after mechanical fall slipping on a rug calling stating her to fall to the left essentially hitting the wall she states that sliding down.  Denies hitting her head.  Denies LOC.  No headache.  Patient is on Eliquis however given shear forces we will obtain CT head to rule out intracranial abnormality or hemorrhage.  Given the left lateral chest wall tenderness we will obtain a CT without contrast of the  chest to evaluate for rib fracture and rule out pneumothorax.  Overall patient appears well.  No concerning findings on physical exam.  Patient's work-up is reassuring.  CT scan of the head and chest are negative for acute abnormality.  As the patient otherwise appears well I believe she is safe for discharge home with Tylenol to be used as needed, as written on the box.  Patient agreeable to plan.  FINAL CLINICAL IMPRESSION(S) / ED DIAGNOSES   Fall Chest wall pain   Note:  This document was prepared using Dragon voice recognition software and may include unintentional dictation errors.   Harvest Dark, MD 06/12/21 2059

## 2021-06-12 NOTE — ED Notes (Signed)
Patient transported to CT 

## 2021-06-15 ENCOUNTER — Ambulatory Visit (INDEPENDENT_AMBULATORY_CARE_PROVIDER_SITE_OTHER): Payer: Medicare Other | Admitting: Gastroenterology

## 2021-06-15 ENCOUNTER — Other Ambulatory Visit: Payer: Self-pay

## 2021-06-15 ENCOUNTER — Encounter: Payer: Self-pay | Admitting: Gastroenterology

## 2021-06-15 ENCOUNTER — Telehealth: Payer: Self-pay | Admitting: Gastroenterology

## 2021-06-15 VITALS — BP 127/84 | HR 88 | Temp 98.1°F | Ht 64.0 in | Wt 156.4 lb

## 2021-06-15 DIAGNOSIS — K50012 Crohn's disease of small intestine with intestinal obstruction: Secondary | ICD-10-CM | POA: Diagnosis not present

## 2021-06-15 DIAGNOSIS — K449 Diaphragmatic hernia without obstruction or gangrene: Secondary | ICD-10-CM | POA: Diagnosis not present

## 2021-06-15 DIAGNOSIS — R1013 Epigastric pain: Secondary | ICD-10-CM

## 2021-06-15 DIAGNOSIS — I77810 Thoracic aortic ectasia: Secondary | ICD-10-CM | POA: Insufficient documentation

## 2021-06-15 MED ORDER — RIFAXIMIN 550 MG PO TABS: 550.0000 mg | ORAL_TABLET | Freq: Three times a day (TID) | ORAL | 0 refills | Status: DC

## 2021-06-15 MED ORDER — RIFAXIMIN 550 MG PO TABS: 550.0000 mg | ORAL_TABLET | Freq: Three times a day (TID) | ORAL | 0 refills | Status: AC

## 2021-06-15 MED ORDER — OMEPRAZOLE 40 MG PO CPDR: 40.0000 mg | DELAYED_RELEASE_CAPSULE | Freq: Every day | ORAL | 0 refills | Status: AC

## 2021-06-15 NOTE — Telephone Encounter (Signed)
Patient son said to sent medication to [facility] in [place]

## 2021-06-15 NOTE — Telephone Encounter (Signed)
Patient pharmacy [facility] states the medication comes in a pack of 60 and they can not open the bottle. They said a new prescription needed to be sent in for 60 tablets or sent to another pharmacy

## 2021-06-15 NOTE — Addendum Note (Signed)
Addended by: Ulyess Blossom L on: 06/15/2021 03:17 PM   Modules accepted: Orders

## 2021-06-15 NOTE — Progress Notes (Signed)
Chelsey Darby, MD [address]  [place]  [place], [place] [address]  Main: [phone]  Fax: [phone]    Gastroenterology Consultation  Referring Provider:     Clarisse Gouge, MD Primary Care Physician:  Briant Sites, DO Primary Gastroenterologist: [facility] gastroenterologist, Dr. Marijo File Reason for Consultation:     Crohn's disease        HPI:   Chelsey Williams is a 88 y.o. female referred by Dr. Briant Sites, DO  for consultation & management of small bowel Crohn's.  Patient has history of not suturing, nonpenetrating Crohn's disease of the small intestine diagnosed based on the colonoscopy and CT enterography by [facility] gastroenterologist in 2022.  Patient has severe reaction, intolerance to steroids including budesonide.  Patient was recently admitted to [facility] in December 2022 secondary to partial small bowel obstruction.  After prolonged discussion, patient agreed to try short course of budesonide.  Patient was discharged home on budesonide, unfortunately she started developing swelling of legs, blurred vision, weakness and she stopped budesonide.  She also developed intolerance to Tikosyn which had to be stopped recently.  Patient could not get rifaximin approved.  Her main concern today is burning in the stomach.  Her weight has been stable.  She denies any constipation or diarrhea or abdominal bloating.  She is taking omeprazole 20 mg as needed only.  Patient is accompanied by her son today.  He reports that they decided to switch care to [facility] for convenience  NSAIDs: None  Antiplts/Anticoagulants/Anti thrombotics: Eliquis for history of A-fib  GI Procedures:  EGD 07/13/2020 - 4 cm hiatal hernia. - Nodular mucosa in the gastric body. Biopsied. - Normal duodenal bulb, first portion of the duodenum,  second portion of the duodenum and third portion of  the duodenum. Biopsied. - Biopsies were taken with a cold forceps for  Helicobacter pylori  testing from the antrum.  Colonoscopy 07/13/2020 Impression: - Preparation of the colon was fair. - Multiple ulcers in the distal ileum. Biopsied. TI  appeared relatively spared. There was also a potential  area of narrowing and stenosis seen of the distal  ileum. - Prominent fold in the ascending colon. Biopsied. - Small lipoma at the hepatic flexure. - Diverticulosis in the recto-sigmoid colon and in the  sigmoid colon. - One 8 mm polyp in the rectum, removed with a hot  snare. Resected and retrieved. Clip was placed. - Non-bleeding internal hemorrhoids. - Biopsies were taken with a cold forceps for  histology in the rectum, in the sigmoid colon, in the  descending colon, in the transverse colon and in the  ascending colon.  DIAGNOSIS    A.  Duodenum, endoscopic biopsy:   Duodenal mucosa with no significant pathologic diagnosis.  No villous blunting or intraepithelial lymphocytosis is seen.   B.  Stomach, antrum, endoscopic biopsy:   Gastric antral and oxyntic mucosa with no significant pathologic diagnosis. Negative for evidence of Helicobacter pylori on routine H&E.    C.  Stomach, body, endoscopic biopsy:   Gastric oxyntic mucosa with no significant pathologic diagnosis. Negative for evidence of Helicobacter pylori on routine H&E.    D.  Terminal ileum, endoscopic biopsy:   Ileal mucosa with evidence of chronic injury and repair. No active enteritis is seen. No granulomas are seen.  See comment.   Comment:  The findings (epithelial reorganization and focal pseudopyloric metaplasia) suggest prior injury with repair as can be seen in inflammatory bowel disease (Crohn's disease, for  example). Other causes of injury with subsequent repair could also lead to these findings.  Clinical correlation is recommended.   E.  Ascending colon fold, endoscopic biopsy:   Colonic mucosa with no significant pathologic diagnosis. No active or chronic colitis is seen. No granulomas  are seen. Negative for dysplasia.    F.  Right colon, endoscopic biopsy:   Colonic mucosa with no significant pathologic diagnosis. No active or chronic colitis is seen. No granulomas are seen. Negative for dysplasia.    G.  Transverse colon, endoscopic biopsy:   Colonic mucosa with no significant pathologic diagnosis. No active or chronic colitis is seen. No granulomas are seen. Negative for dysplasia.    H.  Left colon, endoscopic biopsy:   Colonic mucosa with no significant pathologic diagnosis. No active or chronic colitis is seen. No granulomas are seen. Negative for dysplasia.    I.  Rectum, endoscopic biopsy:   Colonic mucosa with no significant pathologic diagnosis. No active or chronic colitis is seen. No granulomas are seen. Negative for dysplasia.    J.  Rectal polyp, endoscopic polypectomy:   Tubulovillous adenoma, multiple fragments. No high grade dysplasia or carcinoma is seen.    Past Medical History:  Diagnosis Date   Anxiety    Atypical chest pain 11/07/2016   Bilateral hearing loss    Coronary artery disease, non-occlusive    a. LHC 11/2014: R dom, LM nl, mLAD 30-40, dLAD minor irregs, LCx minor irregs, RCA minor irregs w/ associated ectasia   Depression    GERD (gastroesophageal reflux disease)    Glaucoma    Hyperlipemia    Hypertension    Macular degeneration    OA (osteoarthritis)    Pacemaker    a. Medtronic Adapta ADDRL1 dual-chamber pacemaker (serial number [id number]) with Medtronic 0258 atrial lead (serial number [id number]) and Medtronic 5076 ventricular lead (serial number [id number]), all implanted 12/08/2014 by Dr. Jacklynn Barnacle at [facility] [phone].     Palpitations 08/16/2016   Persistent atrial fibrillation ([place])    a. s/p DCCV 02/2016; b. sotalol and Eliquis; c. CHADS2VASc --> 4 (HTN, age x 2, female)   Sinus node dysfunction ([place])     Past Surgical History:  Procedure Laterality Date   CHOLECYSTECTOMY     knee replacmen      PACEMAKER IMPLANT  2014   thyroid nodule     Current Outpatient Medications:    acetaminophen (TYLENOL) 325 MG tablet, Take 325 mg by mouth every 4 (four) hours as needed., Disp: , Rfl:    Aflibercept (EYLEA) 2 MG/0.05ML SOLN, Apply to eye., Disp: , Rfl:    albuterol (PROVENTIL HFA;VENTOLIN HFA) 108 (90 Base) MCG/ACT inhaler, Inhale 2 puffs into the lungs every 6 (six) hours as needed for wheezing or shortness of breath., Disp: 3 each, Rfl: 3   apixaban (ELIQUIS) 5 MG TABS tablet, Take 1 tablet (5 mg total) by mouth 2 (two) times daily., Disp: 90 tablet, Rfl: 3   calcium carbonate (TUMS - DOSED IN MG ELEMENTAL CALCIUM) 500 MG chewable tablet, Chew 1 tablet by mouth every other day., Disp: , Rfl:    Cholecalciferol 50 MCG (2000 UT) TABS, Take 2,000 Units by mouth daily at 12 noon., Disp: , Rfl:    cholestyramine (QUESTRAN) 4 g packet, Take by mouth 2 (two) times daily. , Disp: , Rfl: 0   cyanocobalamin (,VITAMIN B-12,) 1000 MCG/ML injection, Inject 1,000 mcg into the muscle every 30 (thirty) days., Disp: , Rfl:    feeding supplement (ENSURE ENLIVE /  ENSURE PLUS) LIQD, Take 237 mLs by mouth 2 (two) times daily between meals., Disp: 237 mL, Rfl: 12   furosemide (LASIX) 20 MG tablet, Take 1 tablet (20 mg total) by mouth 2 (two) times daily., Disp: 60 tablet, Rfl: 0   ipratropium (ATROVENT HFA) 17 MCG/ACT inhaler, Inhale 2 puffs into the lungs 4 (four) times daily., Disp: , Rfl:    Lecithin 1200 MG CAPS, Take 1,200 mg by mouth daily at 12 noon., Disp: , Rfl:    loperamide (IMODIUM) 2 MG capsule, Take 2 mg by mouth as needed for diarrhea or loose stools., Disp: , Rfl:    metoprolol tartrate (LOPRESSOR) 50 MG tablet, Take 1 tablet (50 mg total) by mouth 2 (two) times daily., Disp: 60 tablet, Rfl: 0   olopatadine (PATANOL) 0.1 % ophthalmic solution, Place 1 drop into the right eye 2 (two) times daily. , Disp: , Rfl: 0   omeprazole (PRILOSEC) 40 MG capsule, Take 1 capsule (40 mg total) by mouth daily  before breakfast., Disp: 30 capsule, Rfl: 0   potassium chloride SA (KLOR-CON M) 20 MEQ tablet, Take 20 mEq by mouth daily., Disp: , Rfl:    psyllium (METAMUCIL) 58.6 % packet, Take 1 packet by mouth daily as needed., Disp: , Rfl:    rifaximin (XIFAXAN) 550 MG TABS tablet, Take 1 tablet (550 mg total) by mouth 3 (three) times daily for 14 days., Disp: 42 tablet, Rfl: 0   Specialty Vitamins Products (MAGNESIUM, AMINO ACID CHELATE,) 133 MG tablet, Take 2 tablets by mouth daily., Disp: , Rfl:    traMADol (ULTRAM) 50 MG tablet, Take 1 tablet (50 mg total) by mouth every 6 (six) hours as needed for up to 3 days for severe pain or moderate pain., Disp: 12 tablet, Rfl: 0   vitamin B-12 1000 MCG tablet, Take 1 tablet (1,000 mcg total) by mouth daily., Disp: 30 tablet, Rfl: 1    Family History  Problem Relation Age of Onset   Hypertension Mother    Stroke Mother    Hypertension Father      Social History   Tobacco Use   Smoking status: Former    Types: Cigarettes    Quit date: 11/15/1999    Years since quitting: 21.5   Smokeless tobacco: Never  Vaping Use   Vaping Use: Never used  Substance Use Topics   Alcohol use: No   Drug use: No    Allergies as of 06/15/2021 - Review Complete 06/15/2021  Allergen Reaction Noted   Cephalexin Anaphylaxis 04/08/2012   Hydrochlorothiazide Other (See Comments) 11/06/2014   Morphine and related Shortness Of Breath 06/10/2021   Povidone-iodine Itching 09/30/2018   Prednisone Shortness Of Breath 11/06/2014   Felodipine Nausea Only 11/06/2014   Magnesium-containing compounds  06/18/2018   Other Other (See Comments) 04/08/2012   Quinolones  12/19/2018   Statins Other (See Comments) 11/06/2014   Amlodipine Other (See Comments) and Palpitations 11/06/2014   Atenolol Palpitations 11/06/2014    Review of Systems:    All systems reviewed and negative except where noted in HPI.   Physical Exam:  BP 127/84 (BP Location: Left Arm, Patient Position:  Sitting, Cuff Size: Normal)    Pulse 88    Temp 98.1 F (36.7 C) (Oral)    Ht 5' 4"  (1.626 m)    Wt 156 lb 6 oz (70.9 kg)    BMI 26.84 kg/m  No LMP recorded. Patient is postmenopausal.  General:   Alert,  Well-developed, well-nourished, pleasant and cooperative  in NAD Head:  Normocephalic and atraumatic. Eyes:  Sclera clear, no icterus.   Conjunctiva pink. Ears: Decreased auditory acuity. Nose:  No deformity, discharge, or lesions. Mouth:  No deformity or lesions,oropharynx pink & moist. Neck:  Supple; no masses or thyromegaly. Lungs:  Respirations even and unlabored.  Clear throughout to auscultation.   No wheezes, crackles, or rhonchi. No acute distress. Heart:  Regular rate and rhythm; no murmurs, clicks, rubs, or gallops. Abdomen:  Normal bowel sounds. Soft, non-tender and non-distended without masses, hepatosplenomegaly or hernias noted.  No guarding or rebound tenderness.   Rectal: Not performed Msk: Left upper extremity weakness Pulses:  Normal pulses noted. Extremities:  No clubbing or edema.  No cyanosis. Neurologic:  Alert and oriented x3;  grossly normal neurologically. Skin:  Intact without significant lesions or rashes. No jaundice. Psych:  Alert and cooperative. Normal mood and affect.  Imaging Studies: Reviewed  Assessment and Plan:   Jailee Jaquez is a 88 y.o. female with history of A-fib on Eliquis, osteoarthritis, hypertension, hearing loss, decreased visual acuity, Crohn's disease of the small intestine   Small bowel Crohn's, not stricturing, nonpenetrating phenotype Colonoscopy from 3/22 revealed chronicity in the terminal ileum with no active disease.  Patient had partial small bowel obstruction in 12/22 relieved with conservative management.  Patient is intolerant to prednisone as well as budesonide.  Do not recommend steroids in future Recommend trial of Xifaxan 550 mg 3 times daily Today, I have discussed regarding steroid sparing therapy, she has indication  for biologic therapy, discussed various treatment options including anti-TNF therapy, antiintegrin's, antiinterleukin's, patient and her son preferred injection to infusion.  I have discussed risks associated with anti-TNF agents including infection, allergic reaction, injection site reaction, small risk for lymphoma, skin cancer.  Patient is willing to try Humira if approved by her insurance and cost effective QuantiFERON gold negative in 2022 Hepatitis B surface antigen, surface antibody negative in 2022 Check hepatitis A antibody, HCV antibody Check fecal calprotectin levels  Large hiatal hernia, patient has 4 cm hiatal hernia based on EGD in 3/22 Recommend to start omeprazole 40 mg daily before breakfast  Follow up in 3 to 4 months   Chelsey Darby, MD

## 2021-06-15 NOTE — Patient Instructions (Addendum)
Take omeprazole 40 mg once a day before breakfast or before dinner-for burning in your stomach.  This has been sent to [facility] drug pharmacy 2.  I have sent in prescription for Xifaxan 550 mg 3 times daily for 2 weeks for bacterial overgrowth, this has been sent to [facility] drug pharmacy.  Please let me know if this medication is expensive 3.  We will apply for Humira to your insurance company 4.  Follow-up visit in 3 months  Please call our office to speak with my nurse Ulyess Blossom [phone] during business hours from 8am to 4pm if you have any questions/concerns. During after hours, you will be redirected to on call GI physician. For any emergency please call 911 or go the nearest emergency room.    Cephas Darby, MD [address]  [place]  [place], [place] [address]  Main: [phone]  Fax: [phone]

## 2021-06-15 NOTE — Telephone Encounter (Signed)
Pt pharmancy is needing info on perscription Xifaxan. Her pharmacy is [facility]

## 2021-06-16 LAB — HEPATITIS C ANTIBODY: Hep C Virus Ab: NONREACTIVE

## 2021-06-16 LAB — HEPATITIS A ANTIBODY, TOTAL: hep A Total Ab: NEGATIVE

## 2021-06-21 ENCOUNTER — Other Ambulatory Visit: Payer: Self-pay

## 2021-06-21 ENCOUNTER — Ambulatory Visit: Payer: Medicare Other | Attending: Family | Admitting: Family

## 2021-06-21 ENCOUNTER — Encounter: Payer: Self-pay | Admitting: Family

## 2021-06-21 VITALS — BP 147/74 | HR 106 | Resp 16 | Ht 63.0 in | Wt 155.0 lb

## 2021-06-21 DIAGNOSIS — R600 Localized edema: Secondary | ICD-10-CM | POA: Insufficient documentation

## 2021-06-21 DIAGNOSIS — I10 Essential (primary) hypertension: Secondary | ICD-10-CM | POA: Diagnosis not present

## 2021-06-21 DIAGNOSIS — J449 Chronic obstructive pulmonary disease, unspecified: Secondary | ICD-10-CM | POA: Insufficient documentation

## 2021-06-21 DIAGNOSIS — I480 Paroxysmal atrial fibrillation: Secondary | ICD-10-CM | POA: Diagnosis not present

## 2021-06-21 DIAGNOSIS — I2510 Atherosclerotic heart disease of native coronary artery without angina pectoris: Secondary | ICD-10-CM | POA: Insufficient documentation

## 2021-06-21 DIAGNOSIS — E785 Hyperlipidemia, unspecified: Secondary | ICD-10-CM | POA: Diagnosis not present

## 2021-06-21 DIAGNOSIS — K5090 Crohn's disease, unspecified, without complications: Secondary | ICD-10-CM | POA: Diagnosis not present

## 2021-06-21 DIAGNOSIS — Z79899 Other long term (current) drug therapy: Secondary | ICD-10-CM | POA: Insufficient documentation

## 2021-06-21 DIAGNOSIS — Z950 Presence of cardiac pacemaker: Secondary | ICD-10-CM | POA: Insufficient documentation

## 2021-06-21 DIAGNOSIS — Z87891 Personal history of nicotine dependence: Secondary | ICD-10-CM | POA: Insufficient documentation

## 2021-06-21 DIAGNOSIS — I110 Hypertensive heart disease with heart failure: Secondary | ICD-10-CM | POA: Insufficient documentation

## 2021-06-21 DIAGNOSIS — I5032 Chronic diastolic (congestive) heart failure: Secondary | ICD-10-CM | POA: Insufficient documentation

## 2021-06-21 NOTE — Progress Notes (Signed)
Name: Chelsey Williams DOB: 02/08/1928 MRN: [id number]  Chelsey Williams is a 88 y.o. female with medical history significant of PAF, PPM, nonobstructive CAD, Crohn's disease, COPD, HTN, and HLD.   Recently hospitalized 2/17-2/19/23 with acute on diastolic heart failure. Diuresed with lasix. Her Tikosyn was stopped during admission due to reports of metallic taste/tingling. She was advised to f/u with the HF clinic as well as her cardiologist.  Echo report from 06/11/21 reviewed: EF 60-65%, mild LVH, grade II diastolic dysfunction. LA moderately dilated.   She presents today for an initial visit to the heart failure with complaints of moderate fatigue with minimal exertion. She endorses feeling worse since her hospital discharge, with the exception of her pedal edema has decreased. She denies CP, SOB, palpitations, dizziness, abdominal swelling, weight gain.   She is weighing most days at home. Saw her PCP this am, they are arranging home health services to begin to help get her back to her baseline.  Her son accompanies her today, they voice concern that her Phyllis Ginger is stopped and feel that once this is resumed she will be back to her baseline.   Past Medical History:  Diagnosis Date   Anxiety    Atypical chest pain 11/07/2016   Bilateral hearing loss    Coronary artery disease, non-occlusive    a. LHC 11/2014: R dom, LM nl, mLAD 30-40, dLAD minor irregs, LCx minor irregs, RCA minor irregs w/ associated ectasia   Depression    GERD (gastroesophageal reflux disease)    Glaucoma    Hyperlipemia    Hypertension    Macular degeneration    OA (osteoarthritis)    Pacemaker    a. Medtronic Adapta ADDRL1 dual-chamber pacemaker (serial number [id number]) with Medtronic 7001 atrial lead (serial number [id number]) and Medtronic 5076 ventricular lead (serial number [id number]), all implanted 12/08/2014 by Dr. Jacklynn Barnacle at [facility] [phone].     Palpitations 08/16/2016   Persistent atrial fibrillation ([place])     a. s/p DCCV 02/2016; b. sotalol and Eliquis; c. CHADS2VASc --> 4 (HTN, age x 2, female)   Sinus node dysfunction ([place])    Past Surgical History:  Procedure Laterality Date   CHOLECYSTECTOMY     knee replacmen     PACEMAKER IMPLANT  2014   thyroid nodule     Family History  Problem Relation Age of Onset   Hypertension Mother    Stroke Mother    Hypertension Father    Social History   Tobacco Use   Smoking status: Former    Types: Cigarettes    Quit date: 11/15/1999    Years since quitting: 21.6   Smokeless tobacco: Never  Substance Use Topics   Alcohol use: No   Allergies  Allergen Reactions   Budesonide Anaphylaxis   Cephalexin Anaphylaxis    Shock / Unconsciousness   Hydrochlorothiazide Other (See Comments)    dizziness dizziness   Morphine And Related Shortness Of Breath   Povidone-Iodine Itching   Prednisone Shortness Of Breath    SOB   Felodipine Nausea Only    Gastrointestinal problems, e.g., nausea, vomiting, diarrheaShock / Unconsciousnessmuscle tenderness   Magnesium-Containing Compounds     Diarrhea   Other Other (See Comments)    A blood pressure med STEROIDS   Quinolones     Aortic aneurysm    Statins Other (See Comments)   Amlodipine Other (See Comments) and Palpitations   Atenolol Palpitations    heart palpitations   Prior to Admission medications   Medication Sig Start  Date End Date Taking? Authorizing Provider  acetaminophen (TYLENOL) 325 MG tablet Take 325 mg by mouth every 4 (four) hours as needed.   Yes [provider]  Aflibercept (EYLEA) 2 MG/0.05ML SOLN Apply to eye. 02/21/13  Yes [provider]  albuterol (PROVENTIL HFA;VENTOLIN HFA) 108 (90 Base) MCG/ACT inhaler Inhale 2 puffs into the lungs every 6 (six) hours as needed for wheezing or shortness of breath. 10/17/17  Yes Glean Hess, MD  apixaban (ELIQUIS) 5 MG TABS tablet Take 1 tablet (5 mg total) by mouth 2 (two) times daily. 07/25/17  Yes Deboraha Sprang, MD   calcium carbonate (TUMS - DOSED IN MG ELEMENTAL CALCIUM) 500 MG chewable tablet Chew 1 tablet by mouth every other day.   Yes [provider]  Cholecalciferol 50 MCG (2000 UT) TABS Take 2,000 Units by mouth daily at 12 noon.   Yes [provider]  cholestyramine (QUESTRAN) 4 g packet Take by mouth 2 (two) times daily.  11/13/16  Yes [provider]  cyanocobalamin (,VITAMIN B-12,) 1000 MCG/ML injection Inject 1,000 mcg into the muscle every 30 (thirty) days. 05/13/21 07/13/21 Yes [provider]  diltiazem (TIAZAC) 180 MG 24 hr capsule Take 180 mg by mouth 2 (two) times daily. 04/29/21  Yes [provider]  furosemide (LASIX) 20 MG tablet Take 1 tablet (20 mg total) by mouth 2 (two) times daily. 06/12/21 07/12/21 Yes Max Sane, MD  Lecithin 1200 MG CAPS Take 1,200 mg by mouth daily at 12 noon.   Yes [provider]  loperamide (IMODIUM) 2 MG capsule Take 2 mg by mouth as needed for diarrhea or loose stools.   Yes [provider]  metoprolol tartrate (LOPRESSOR) 50 MG tablet Take 1 tablet (50 mg total) by mouth 2 (two) times daily. 06/12/21 07/12/21 Yes Max Sane, MD  olopatadine (PATANOL) 0.1 % ophthalmic solution Place 1 drop into the right eye 2 (two) times daily.  11/07/16  Yes [provider]  omeprazole (PRILOSEC) 40 MG capsule Take 1 capsule (40 mg total) by mouth daily before breakfast. 06/15/21 07/15/21 Yes Vanga, Tally Due, MD  potassium chloride SA (KLOR-CON M) 20 MEQ tablet Take 20 mEq by mouth daily. 12/24/20  Yes [provider]  psyllium (METAMUCIL) 58.6 % packet Take 1 packet by mouth daily as needed.   Yes [provider]  rifaximin (XIFAXAN) 550 MG TABS tablet Take 1 tablet (550 mg total) by mouth 3 (three) times daily for 14 days. 06/15/21 06/29/21 Yes Vanga, Tally Due, MD  Specialty Vitamins Products (MAGNESIUM, AMINO ACID CHELATE,) 133 MG tablet Take 2 tablets by mouth daily. 05/11/20  Yes [provider]  traMADol (ULTRAM) 50 MG tablet Take 50 mg by mouth as needed. 06/12/21  Yes [provider]  feeding supplement (ENSURE ENLIVE / ENSURE PLUS) LIQD Take 237 mLs by mouth 2 (two) times daily between meals. Patient not taking: Reported on 06/21/2021 06/12/21   Max Sane, MD  ipratropium (ATROVENT HFA) 17 MCG/ACT inhaler Inhale 2 puffs into the lungs 4 (four) times daily. Patient not taking: Reported on 06/21/2021 02/11/18 06/15/21  [provider]  vitamin B-12 1000 MCG tablet Take 1 tablet (1,000 mcg total) by mouth daily. Patient not taking: Reported on 06/21/2021 04/05/21   Gwynne Edinger, MD   Review of Systems  Constitutional:  Positive for malaise/fatigue.  HENT:  Positive for hearing loss.   Eyes:        Macular degeneration  Respiratory:  Positive  for cough (usually first thing in the am). Negative for shortness of breath and wheezing.   Cardiovascular:  Positive for leg swelling. Negative for chest pain, palpitations and orthopnea.  Gastrointestinal:  Positive for abdominal pain (d/t Chron's).  Genitourinary: Negative.   Musculoskeletal:  Positive for falls (slid in the bathtub) and myalgias.  Skin: Negative.   Neurological:  Negative for dizziness, weakness and headaches.  Endo/Heme/Allergies: Negative.   Psychiatric/Behavioral: Negative.      Physical Exam Vitals and nursing note reviewed. Exam conducted with a chaperone present (son).  Constitutional:      General: She is not in acute distress.    Appearance: She is not toxic-appearing.  Cardiovascular:     Rate and Rhythm: Normal rate and regular rhythm.     Heart sounds: Murmur heard.  Pulmonary:     Effort: No respiratory distress.     Breath sounds: No wheezing or rales.  Abdominal:     Palpations: Abdomen is soft.  Musculoskeletal:        General: Swelling present.     Right lower leg: Edema (+1) present.     Left lower leg: Edema (+1) present.  Neurological:     Mental Status:  She is alert and oriented to person, place, and time. Mental status is at baseline.  Psychiatric:        Mood and Affect: Mood normal.        Thought Content: Thought content normal.    Lab Results  Component Value Date   CREATININE 0.82 06/12/2021   CREATININE 0.69 06/11/2021   CREATININE 0.72 06/10/2021    Filed Weights   06/21/21 1236  Weight: 155 lb (70.3 kg)    Vitals with BMI 06/21/2021 06/15/2021 06/12/2021  Height 5' 3"  5' 4"  -  Weight 155 lbs 156 lbs 6 oz -  BMI 70.01 74.94 -  Systolic 496 759 163  Diastolic 74 84 95  Pulse 846 88 84    Assessment & Plan:  Heart failure with preserved ejection fraction with structural changes - NYHA class III - euvolemic - weighing most days, son reports home health will begin soon and they will be monitoring her weights remotely - has updated her EP MD Omelia Blackwater) that her Tikosyn was discontinued and is waiting to hear back about what to do - waiting to hear back from her cardiologist regarding f/u appt - on lasix, metoprolol tartrate - pedal edema is "much improved"  - BNP 06/10/21 was 107.7  2. Hypertension - saw PCP today Jacklyn Shell), she is trying to help sort out her medications s/p hospitalization - BP 147/74 today, had not taken her medications yet - BMP reviewed 06/12/21 sodium 137, potassium 4.3, creatinine 0.82, gfr > 60  3. Atrial fibrillation - RRR today - on diltiazem and metoprolol since discontinuation of Tikosyn, however they were told by PCP today to stop diltiazem   Medication bottles reviewed.  Due to numerous providers checking in with her regularly, will not schedule a f/u appt. Explained that they can call the office or return at any time.

## 2021-06-21 NOTE — Patient Instructions (Signed)
Follow up with the Heart Failure clinic as needed.  Call us if you need anything.

## 2021-06-22 ENCOUNTER — Telehealth: Payer: Self-pay | Admitting: Primary Care

## 2021-06-22 NOTE — Telephone Encounter (Signed)
Rec'd return call from patient's son Aaron Edelman, we discussed the referral/services and all questions were answered and he was in agreement with scheduling visit with [place].  I have scheduled an In-home Consult for 06/23/21 @ 11 AM. ?

## 2021-06-22 NOTE — Telephone Encounter (Signed)
Attempted to contact son to schedule Palliative Consult, no answer - left message requesting a return call. ?

## 2021-06-23 ENCOUNTER — Other Ambulatory Visit: Payer: Medicare Other | Admitting: Primary Care

## 2021-06-23 ENCOUNTER — Other Ambulatory Visit: Payer: Self-pay

## 2021-06-23 DIAGNOSIS — R609 Edema, unspecified: Secondary | ICD-10-CM

## 2021-06-23 DIAGNOSIS — H9193 Unspecified hearing loss, bilateral: Secondary | ICD-10-CM

## 2021-06-23 DIAGNOSIS — Z515 Encounter for palliative care: Secondary | ICD-10-CM

## 2021-06-23 DIAGNOSIS — F419 Anxiety disorder, unspecified: Secondary | ICD-10-CM

## 2021-06-23 DIAGNOSIS — J449 Chronic obstructive pulmonary disease, unspecified: Secondary | ICD-10-CM

## 2021-06-23 DIAGNOSIS — K50012 Crohn's disease of small intestine with intestinal obstruction: Secondary | ICD-10-CM

## 2021-06-23 DIAGNOSIS — I5032 Chronic diastolic (congestive) heart failure: Secondary | ICD-10-CM

## 2021-06-23 DIAGNOSIS — R600 Localized edema: Secondary | ICD-10-CM

## 2021-06-23 DIAGNOSIS — I480 Paroxysmal atrial fibrillation: Secondary | ICD-10-CM

## 2021-06-23 DIAGNOSIS — R52 Pain, unspecified: Secondary | ICD-10-CM

## 2021-06-23 NOTE — Progress Notes (Signed)
Therapist, nutritional Palliative Care Consult Note Telephone: [phone]  Fax: [phone]   Date of encounter: 06/23/21 11:13 AM PATIENT NAME: Chelsey Williams [address] [place] [address]   [phone] (home)  DOB: 11/09/1927 MRN: [id number] PRIMARY CARE PROVIDER:    Eugenie Filler, DO,  [address] [facility] [place] [address] [phone]  REFERRING PROVIDER:   Eugenie Filler, DO [address] [facility] [place],  [place] [address] [phone]  RESPONSIBLE PARTY:    Contact Information     Name Relation Home Work Mobile   Chelsey Williams [id number]  [phone]        I met face to face with patient and family in  home. Palliative Care was asked to follow this patient by consultation request of  Chelsey Filler, DO to address advance care planning and complex medical decision making. This is the initial visit.                                     ASSESSMENT AND PLAN / RECOMMENDATIONS:   Advance Care Planning/Goals of Care: Goals include to maximize quality of life and symptom management. Patient/health care surrogate gave his/her permission to discuss.Our advance care planning conversation included a discussion about:    The value and importance of advance care planning  Experiences with loved ones who have been seriously ill or have died - husband was on hospice Exploration of personal, cultural or spiritual beliefs that might influence medical decisions  Exploration of goals of care in the event of a sudden injury or illness  Identification of a healthcare agent - son  Five wishes and MOST form given. Review of an  advance directive document . CODE STATUS: States DNR, will review most form for next time.  I spent 20 minutes providing this consultation. More than 50% of the time in this consultation was spent in counseling and care  coordination.  ----------------------------------------------------------------------------------------------------------------  Symptom Management/Plan:  Home health orders: PT, OT eval and treat. SN medication management, safety and disease teaching. HHA for personal care.   Mobility: Discussed recent decline in function following 2 hospital visits. Has decreased in her strength. Able to ambulate in home but fall risk . Has a standard walker and can rise and ambulate (I) but at risk for falls. She fell on a throw rug which was the reason for her ED visit on 06/12/21. Discussed need for PT and OT. Endorses wanting to 'get stronger" and does a few exercises each day. She is dependent in all IADLS and needs supervision and dme in bathroom for safe bathing. Needs PT to assess safety, devise HEP and work on fall prevention. Home is cluttered and increases fall risk. I will make home health referral for in home.  Cardiac : H/o arrhythmias.3/6 murmur. Endorse h/o chest pain / pressure which has resolved with diltiazem.  Has LE edema 3 +. Discussed compression  stockings and daily weighing.  They will obtain supplies. Discussed low Na diet. And how to count sodium mg.  Has LE edema, 3+ bil and some injury from dog claws on feet.   COPD: History of smoking but stopped  20 years ago. Lungs clear today.  Medication management: Needs home health SN to review. Has had some recent changes. Reviewed today. Needs monitoring as she is alone during day when son works.  Care strain: Son is trying  to hold a job and needs to be with his mom for appts and oversight. Pt is alone with dog most of the day. We discussed hiring caregivers for some daytime monitoring/ help.  They are not from area originally but have been here 12 years. Patient states if she went into a nursing [facility] [place] [facility] [place].  Crohns: decided  to not take humira. Is afraid of side effects. Had a stool  ileus and was throwing up bile. We discussed constipation management importantance.  Follow up Palliative Care Visit: Palliative care will continue to follow for complex medical decision making, advance care planning, and clarification of goals. Return 3-4 weeks or prn.  This visit was coded based on medical decision making (MDM).  PPS: 40%  HOSPICE ELIGIBILITY/DIAGNOSIS: TBD  Chief Complaint: debility, immobility,fall risk  HISTORY OF PRESENT ILLNESS:  Chelsey Williams is a 88 y.o. year old female  with h/o CHF, arrhythmias, debility, falls and fall risk, immobility, hearing loss. Today seen for PC introduction. We reviewed her recent stay in hospital and goals for recovering. Discussed ACP  initially, will continue to f/u for documents.    History obtained from review of EMR, discussion with primary team, and interview with family, facility staff/caregiver and/or Ms. Chelsey Williams.  I reviewed available labs, medications, imaging, studies and related documents from the EMR.  Records reviewed and summarized above.   ROS  General: NAD EYES: denies vision changes, has macular degeneration ENMT: denies dysphagia Cardiovascular: denies chest pain, had previously but has resolved, denies DOE Pulmonary: denies cough, denies increased SOB Abdomen: endorses good appetite, denies constipation, endorses continence of bowel GU: denies dysuria, endorses continence of urine MSK:  denies increased weakness,  + in Dec falls reported Skin: scratches on feet from pet dog, healing Neurological: endorses occ  MSK pain, denies insomnia Psych: Endorses positive mood Heme/lymph/immuno: denies bruises, abnormal bleeding  Physical Exam: Current and past weights: Constitutional: 107/63 HR 90 RR 18 General: frail appearing, twnwd EYES: anicteric sclera, lids intact, no discharge  ENMT: hard of hearing, oral mucous membranes moist, dentition intact CV: S1S2, RRR, 3+  LE edema in feet to mid tibia Pulmonary: LCTA,  no increased work of breathing, no cough, room air Abdomen: intake 75%,  no ascites GU: deferred MSK: mod sarcopenia, moves all extremities, ambulatory with walker  Skin: warm and dry, no rashes or wounds on visible skin Neuro:  + generalized weakness,  mild cognitive impairment Psych: non-anxious affect, A and O x 3 Hem/lymph/immuno: no widespread bruising  CURRENT PROBLEM LIST:  Patient Active Problem List   Diagnosis Date Noted   Ascending aorta dilatation (HCC) 06/15/2021   Peripheral edema    Shortness of breath    Pain    Acute on chronic diastolic CHF (congestive heart failure) (HCC) 06/10/2021   CHF (congestive heart failure) (HCC) 06/10/2021   Temporal arteritis (HCC) 05/13/2021   B12 deficiency 04/02/2021   Crohn's disease (HCC) 04/01/2021   Partial small bowel obstruction (HCC) 03/31/2021   Hiatal hernia with gastroesophageal reflux    Primary osteoarthritis of right knee 07/05/2018   Acute abdominal pain 06/12/2018   COPD (chronic obstructive pulmonary disease) (HCC) 10/17/2017   Irritable bowel syndrome with diarrhea 10/17/2017   Scalp lesion 10/17/2017   Vision loss of left eye 08/21/2017   Labile hypertension 11/07/2016   Anxiety 11/07/2016   Coronary artery disease, non-occlusive 08/16/2016   Episodic atrial fibrillation (HCC) 08/16/2016   Pacemaker 08/16/2016  Essential hypertension 08/16/2016   Dilatation of thoracic aorta (HCC) 08/16/2016   HLD (hyperlipidemia) 08/16/2016   Macular degeneration 08/16/2016   Anticoagulant long-term use 03/24/2016   AV block 03/24/2016   Bilateral hearing loss 11/06/2014   History of thyroid nodule 11/06/2014   Nonexudative senile macular degeneration of retina 03/31/2013   Pseudophakia 03/31/2013   PVD (posterior vitreous detachment), both eyes 03/31/2013   Major depressive disorder, single episode, unspecified 10/03/2005   Generalized osteoarthritis of multiple sites 02/05/2004   PAST MEDICAL HISTORY:  Active  Ambulatory Problems    Diagnosis Date Noted   Coronary artery disease, non-occlusive 08/16/2016   Episodic atrial fibrillation (HCC) 08/16/2016   Pacemaker 08/16/2016   Essential hypertension 08/16/2016   Dilatation of thoracic aorta (HCC) 08/16/2016   HLD (hyperlipidemia) 08/16/2016   Macular degeneration 08/16/2016   Labile hypertension 11/07/2016   Anxiety 11/07/2016   Anticoagulant long-term use 03/24/2016   AV block 03/24/2016   Bilateral hearing loss 11/06/2014   Nonexudative senile macular degeneration of retina 03/31/2013   Generalized osteoarthritis of multiple sites 02/05/2004   History of thyroid nodule 11/06/2014   Pseudophakia 03/31/2013   PVD (posterior vitreous detachment), both eyes 03/31/2013   Vision loss of left eye 08/21/2017   COPD (chronic obstructive pulmonary disease) (HCC) 10/17/2017   Irritable bowel syndrome with diarrhea 10/17/2017   Scalp lesion 10/17/2017   Acute abdominal pain 06/12/2018   Partial small bowel obstruction (HCC) 03/31/2021   Hiatal hernia with gastroesophageal reflux    Crohn's disease (HCC) 04/01/2021   B12 deficiency 04/02/2021   Acute on chronic diastolic CHF (congestive heart failure) (HCC) 06/10/2021   CHF (congestive heart failure) (HCC) 06/10/2021   Peripheral edema    Shortness of breath    Pain    Ascending aorta dilatation (HCC) 06/15/2021   Major depressive disorder, single episode, unspecified 10/03/2005   Temporal arteritis (HCC) 05/13/2021   Primary osteoarthritis of right knee 07/05/2018   Resolved Ambulatory Problems    Diagnosis Date Noted   Palpitations 08/16/2016   Atypical chest pain 11/07/2016   Past Medical History:  Diagnosis Date   Depression    GERD (gastroesophageal reflux disease)    Glaucoma    Hyperlipemia    Hypertension    OA (osteoarthritis)    Persistent atrial fibrillation (HCC)    Sinus node dysfunction (HCC)    SOCIAL HX:  Social History   Tobacco Use   Smoking status: Former     Types: Cigarettes    Quit date: 11/15/1999    Years since quitting: 21.6   Smokeless tobacco: Never  Substance Use Topics   Alcohol use: No   FAMILY HX:  Family History  Problem Relation Age of Onset   Hypertension Mother    Stroke Mother    Hypertension Father       ALLERGIES:  Allergies  Allergen Reactions   Budesonide Anaphylaxis   Cephalexin Anaphylaxis    Shock / Unconsciousness   Hydrochlorothiazide Other (See Comments)    dizziness dizziness   Morphine And Related Shortness Of Breath   Povidone-Iodine Itching   Prednisone Shortness Of Breath    SOB   Felodipine Nausea Only    Gastrointestinal problems, e.g., nausea, vomiting, diarrheaShock / Unconsciousnessmuscle tenderness   Magnesium-Containing Compounds     Diarrhea   Other Other (See Comments)    A blood pressure med STEROIDS   Quinolones     Aortic aneurysm    Statins Other (See Comments)   Amlodipine Other (See Comments)  and Palpitations   Atenolol Palpitations    heart palpitations     PERTINENT MEDICATIONS:  Outpatient Encounter Medications as of 06/23/2021  Medication Sig   Aflibercept (EYLEA) 2 MG/0.05ML SOLN Apply to eye.   apixaban (ELIQUIS) 5 MG TABS tablet Take 1 tablet (5 mg total) by mouth 2 (two) times daily.   calcium carbonate (TUMS - DOSED IN MG ELEMENTAL CALCIUM) 500 MG chewable tablet Chew 1 tablet by mouth every other day.   Cholecalciferol 50 MCG (2000 UT) TABS Take 2,000 Units by mouth daily at 12 noon.   cyanocobalamin (,VITAMIN B-12,) 1000 MCG/ML injection Inject 1,000 mcg into the muscle every 30 (thirty) days.   diltiazem (TIAZAC) 180 MG 24 hr capsule Take 180 mg by mouth 2 (two) times daily.   dofetilide (TIKOSYN) 250 MCG capsule Take 250 mcg by mouth 2 (two) times daily.   feeding supplement (ENSURE ENLIVE / ENSURE PLUS) LIQD Take 237 mLs by mouth 2 (two) times daily between meals.   furosemide (LASIX) 20 MG tablet Take 1 tablet (20 mg total) by mouth 2 (two) times daily.    loperamide (IMODIUM) 2 MG capsule Take 2 mg by mouth as needed for diarrhea or loose stools.   magnesium hydroxide (MILK OF MAGNESIA) 400 MG/5ML suspension Take by mouth daily as needed for mild constipation.   olopatadine (PATANOL) 0.1 % ophthalmic solution Place 1 drop into the right eye 2 (two) times daily.    omeprazole (PRILOSEC) 40 MG capsule Take 1 capsule (40 mg total) by mouth daily before breakfast.   potassium chloride SA (KLOR-CON M) 20 MEQ tablet Take 20 mEq by mouth daily.   psyllium (METAMUCIL) 58.6 % packet Take 1 packet by mouth daily as needed.   Specialty Vitamins Products (MAGNESIUM, AMINO ACID CHELATE,) 133 MG tablet Take 2 tablets by mouth daily.   traMADol (ULTRAM) 50 MG tablet Take 50 mg by mouth as needed.   vitamin B-12 1000 MCG tablet Take 1 tablet (1,000 mcg total) by mouth daily.   acetaminophen (TYLENOL) 325 MG tablet Take 325 mg by mouth every 4 (four) hours as needed.   albuterol (PROVENTIL HFA;VENTOLIN HFA) 108 (90 Base) MCG/ACT inhaler Inhale 2 puffs into the lungs every 6 (six) hours as needed for wheezing or shortness of breath.   cholestyramine (QUESTRAN) 4 g packet Take by mouth 2 (two) times daily.  (Patient not taking: Reported on 06/23/2021)   ipratropium (ATROVENT HFA) 17 MCG/ACT inhaler Inhale 2 puffs into the lungs 4 (four) times daily. (Patient not taking: Reported on 06/21/2021)   Lecithin 1200 MG CAPS Take 1,200 mg by mouth daily at 12 noon.   metoprolol tartrate (LOPRESSOR) 50 MG tablet Take 1 tablet (50 mg total) by mouth 2 (two) times daily. (Patient not taking: Reported on 06/23/2021)   rifaximin (XIFAXAN) 550 MG TABS tablet Take 1 tablet (550 mg total) by mouth 3 (three) times daily for 14 days. (Patient not taking: Reported on 06/23/2021)   No facility-administered encounter medications on file as of 06/23/2021.     Thank you for the opportunity to participate in the care of Ms. Linck.  The palliative care team will continue to follow. Please call our  office at [phone] if we can be of additional assistance.   Eliezer Lofts, NP , DNP, AGPCNP-BC  COVID-19 PATIENT SCREENING TOOL Asked and negative response unless otherwise noted:  Have you had symptoms of covid, tested positive or been in contact with someone with symptoms/positive test in the past  5-10 days?

## 2021-06-28 ENCOUNTER — Telehealth: Payer: Self-pay

## 2021-06-28 NOTE — Telephone Encounter (Signed)
Per Liliane Channel with [facility] states I see we are calling patient Chelsey L. May 14, 1927 for shipment.  She is approved with low copay.  She can call us at [phone] for overnight shipment in case you may be able to reach her. :) ? ?  ? ?Sent patient a Pharmacist, community message with this information  ?

## 2021-07-15 ENCOUNTER — Telehealth: Payer: Self-pay | Admitting: Primary Care

## 2021-07-15 NOTE — Telephone Encounter (Signed)
Returned call to patient's son Aaron Edelman, and he stated that per patient's request she has declined Palliative services at this time.  Explained to son that we would discharge patient from our services and if she changed her mind in the future and wanted [place] to come back out to let her MD know so they could send [place] a referral and he was in agreement with this.   ?

## 2021-07-20 ENCOUNTER — Other Ambulatory Visit: Payer: Medicare Other | Admitting: Primary Care

## 2021-08-02 DIAGNOSIS — G894 Chronic pain syndrome: Secondary | ICD-10-CM | POA: Insufficient documentation

## 2021-08-02 DIAGNOSIS — Z789 Other specified health status: Secondary | ICD-10-CM | POA: Insufficient documentation

## 2021-08-03 DIAGNOSIS — L7212 Trichodermal cyst: Secondary | ICD-10-CM | POA: Insufficient documentation

## 2021-09-12 ENCOUNTER — Ambulatory Visit: Payer: Medicare Other | Admitting: Gastroenterology

## 2022-06-14 DIAGNOSIS — I498 Other specified cardiac arrhythmias: Secondary | ICD-10-CM | POA: Insufficient documentation

## 2022-07-07 DIAGNOSIS — I340 Nonrheumatic mitral (valve) insufficiency: Secondary | ICD-10-CM | POA: Insufficient documentation

## 2022-07-07 DIAGNOSIS — I350 Nonrheumatic aortic (valve) stenosis: Secondary | ICD-10-CM | POA: Insufficient documentation

## 2022-07-07 DIAGNOSIS — R7989 Other specified abnormal findings of blood chemistry: Secondary | ICD-10-CM | POA: Insufficient documentation

## 2022-09-25 DIAGNOSIS — Z974 Presence of external hearing-aid: Secondary | ICD-10-CM | POA: Insufficient documentation

## 2022-09-25 DIAGNOSIS — H903 Sensorineural hearing loss, bilateral: Secondary | ICD-10-CM | POA: Insufficient documentation

## 2023-04-12 ENCOUNTER — Encounter: Payer: Self-pay | Admitting: Podiatry

## 2023-04-12 ENCOUNTER — Ambulatory Visit: Payer: Medicare Other | Admitting: Podiatry

## 2023-04-12 DIAGNOSIS — M2042 Other hammer toe(s) (acquired), left foot: Secondary | ICD-10-CM

## 2023-04-12 DIAGNOSIS — M2041 Other hammer toe(s) (acquired), right foot: Secondary | ICD-10-CM

## 2023-04-12 DIAGNOSIS — D2371 Other benign neoplasm of skin of right lower limb, including hip: Secondary | ICD-10-CM

## 2023-04-12 DIAGNOSIS — B351 Tinea unguium: Secondary | ICD-10-CM

## 2023-04-12 DIAGNOSIS — M79676 Pain in unspecified toe(s): Secondary | ICD-10-CM

## 2023-04-16 NOTE — Progress Notes (Signed)
She presents today chief complaint of painful thick elongated nails.  States that is painful to ambulate in shoes.  Objective: Vital signs are stable alert oriented x 3.  Pulses are palpable.  Toenails are long thick yellow dystrophic like mycotic sharply incurvated and malformed.  She also has hammertoe deformity  Assessment: Pain in limb secondary to onychomycosis.  Hammertoe deformity second toe right foot  Plan: Debridement of toenails.  Discussed possible tenotomy's of the hammertoe.  She will follow-up with Dr. Donzetta Matters otherwise

## 2023-05-11 DIAGNOSIS — J111 Influenza due to unidentified influenza virus with other respiratory manifestations: Secondary | ICD-10-CM | POA: Insufficient documentation

## 2023-07-03 ENCOUNTER — Ambulatory Visit: Admitting: Podiatry

## 2023-08-08 ENCOUNTER — Ambulatory Visit (INDEPENDENT_AMBULATORY_CARE_PROVIDER_SITE_OTHER): Payer: Medicare Other | Admitting: Podiatry

## 2023-08-08 DIAGNOSIS — Z91199 Patient's noncompliance with other medical treatment and regimen due to unspecified reason: Secondary | ICD-10-CM

## 2023-08-09 NOTE — Progress Notes (Signed)
 1. No-show for appointment   No show #1.

## 2023-09-11 ENCOUNTER — Ambulatory Visit (INDEPENDENT_AMBULATORY_CARE_PROVIDER_SITE_OTHER): Admitting: Podiatry

## 2023-09-11 ENCOUNTER — Encounter: Payer: Self-pay | Admitting: Podiatry

## 2023-09-11 DIAGNOSIS — M79676 Pain in unspecified toe(s): Secondary | ICD-10-CM

## 2023-09-11 DIAGNOSIS — B351 Tinea unguium: Secondary | ICD-10-CM

## 2023-09-11 NOTE — Progress Notes (Signed)
 She presents today stating that her toenails are cutting into her skin.  Objective: Vital signs are stable alert oriented x 3.  Pulses are palpable.  Her toenails are long thick yellow dystrophic like mycotic sharply incurvated and painful palpation.  Assessment: Pain in limb secondary to onychomycosis.  Plan: Debridement of toenails 1 through 5 bilateral.

## 2024-01-15 ENCOUNTER — Encounter: Payer: Self-pay | Admitting: Podiatry

## 2024-01-15 ENCOUNTER — Ambulatory Visit (INDEPENDENT_AMBULATORY_CARE_PROVIDER_SITE_OTHER): Admitting: Podiatry

## 2024-01-15 DIAGNOSIS — D689 Coagulation defect, unspecified: Secondary | ICD-10-CM | POA: Diagnosis not present

## 2024-01-15 DIAGNOSIS — M79676 Pain in unspecified toe(s): Secondary | ICD-10-CM | POA: Diagnosis not present

## 2024-01-15 DIAGNOSIS — R7309 Other abnormal glucose: Secondary | ICD-10-CM | POA: Insufficient documentation

## 2024-01-15 DIAGNOSIS — B351 Tinea unguium: Secondary | ICD-10-CM

## 2024-01-15 DIAGNOSIS — E7800 Pure hypercholesterolemia, unspecified: Secondary | ICD-10-CM | POA: Insufficient documentation

## 2024-01-15 DIAGNOSIS — D2371 Other benign neoplasm of skin of right lower limb, including hip: Secondary | ICD-10-CM | POA: Diagnosis not present

## 2024-01-15 DIAGNOSIS — R5381 Other malaise: Secondary | ICD-10-CM | POA: Insufficient documentation

## 2024-01-15 NOTE — Progress Notes (Signed)
 She presents today stating that her toenails are cutting into her skin.  Objective: Vital signs are stable alert oriented x 3.  Pulses are palpable.  Her toenails are long thick yellow dystrophic like mycotic sharply incurvated and painful palpation.  Assessment: Pain in limb secondary to onychomycosis.  Plan: Debridement of toenails 1 through 5 bilateral.

## 2024-04-15 ENCOUNTER — Ambulatory Visit: Admitting: Podiatry

## 2024-04-15 ENCOUNTER — Encounter: Payer: Self-pay | Admitting: Podiatry

## 2024-04-15 DIAGNOSIS — B351 Tinea unguium: Secondary | ICD-10-CM

## 2024-04-15 DIAGNOSIS — M79676 Pain in unspecified toe(s): Secondary | ICD-10-CM

## 2024-04-15 DIAGNOSIS — D689 Coagulation defect, unspecified: Secondary | ICD-10-CM

## 2024-04-15 NOTE — Progress Notes (Signed)
 She presents today stating that her toenails are cutting into her skin.  Objective: Vital signs are stable alert oriented x 3.  Pulses are palpable.  Her toenails are long thick yellow dystrophic like mycotic sharply incurvated and painful palpation.  Assessment: Pain in limb secondary to onychomycosis.  Plan: Debridement of toenails 1 through 5 bilateral.

## 2024-07-15 ENCOUNTER — Ambulatory Visit: Admitting: Podiatry
